# Patient Record
Sex: Female | Born: 1945 | Race: White | Hispanic: No | Marital: Married | Smoking: Current some day smoker
Health system: Southern US, Community
[De-identification: ages and names within clinical notes are randomized; demographics above are authoritative.]

## PROBLEM LIST (undated history)

## (undated) DIAGNOSIS — F0150 Vascular dementia without behavioral disturbance: Secondary | ICD-10-CM

## (undated) DIAGNOSIS — I10 Essential (primary) hypertension: Secondary | ICD-10-CM

## (undated) DIAGNOSIS — F0280 Dementia in other diseases classified elsewhere without behavioral disturbance: Secondary | ICD-10-CM

## (undated) DIAGNOSIS — M1990 Unspecified osteoarthritis, unspecified site: Secondary | ICD-10-CM

## (undated) HISTORY — PX: HIP SURGERY: SHX245

## (undated) HISTORY — PX: TONSILLECTOMY: SUR1361

---

## 2007-09-07 ENCOUNTER — Emergency Department: Payer: Self-pay | Admitting: Emergency Medicine

## 2007-09-07 ENCOUNTER — Other Ambulatory Visit: Payer: Self-pay

## 2009-02-28 ENCOUNTER — Emergency Department: Payer: Self-pay | Admitting: Internal Medicine

## 2009-10-19 ENCOUNTER — Emergency Department: Payer: Self-pay | Admitting: Emergency Medicine

## 2009-10-24 ENCOUNTER — Emergency Department: Payer: Self-pay | Admitting: Internal Medicine

## 2009-11-12 ENCOUNTER — Ambulatory Visit: Payer: Self-pay | Admitting: Unknown Physician Specialty

## 2009-11-18 ENCOUNTER — Ambulatory Visit: Payer: Self-pay | Admitting: Internal Medicine

## 2009-11-19 ENCOUNTER — Ambulatory Visit: Payer: Self-pay | Admitting: Internal Medicine

## 2009-11-23 ENCOUNTER — Ambulatory Visit: Payer: Self-pay | Admitting: Internal Medicine

## 2009-11-23 LAB — CA 125: CA 125: 16 U/mL (ref 0.0–34.0)

## 2009-11-23 LAB — CEA: CEA: 4 ng/mL (ref 0.0–4.7)

## 2009-12-10 ENCOUNTER — Ambulatory Visit: Payer: Self-pay | Admitting: Unknown Physician Specialty

## 2009-12-14 LAB — PATHOLOGY REPORT

## 2009-12-18 ENCOUNTER — Ambulatory Visit: Payer: Self-pay | Admitting: Internal Medicine

## 2010-02-27 ENCOUNTER — Emergency Department: Payer: Self-pay | Admitting: Emergency Medicine

## 2010-03-23 ENCOUNTER — Ambulatory Visit: Payer: Self-pay | Admitting: Unknown Physician Specialty

## 2010-04-13 ENCOUNTER — Ambulatory Visit: Payer: Self-pay | Admitting: Internal Medicine

## 2010-04-15 ENCOUNTER — Ambulatory Visit: Payer: Self-pay | Admitting: Internal Medicine

## 2010-04-20 ENCOUNTER — Ambulatory Visit: Payer: Self-pay | Admitting: Internal Medicine

## 2010-10-04 ENCOUNTER — Emergency Department: Payer: Self-pay | Admitting: Emergency Medicine

## 2010-10-06 ENCOUNTER — Emergency Department: Payer: Self-pay | Admitting: *Deleted

## 2011-01-10 ENCOUNTER — Ambulatory Visit: Payer: Self-pay | Admitting: Gastroenterology

## 2011-06-23 ENCOUNTER — Ambulatory Visit: Payer: Self-pay | Admitting: Unknown Physician Specialty

## 2011-09-01 ENCOUNTER — Emergency Department: Payer: Self-pay | Admitting: Unknown Physician Specialty

## 2011-12-25 ENCOUNTER — Ambulatory Visit: Payer: Self-pay | Admitting: Unknown Physician Specialty

## 2012-03-26 IMAGING — CT CT ABD-PELV W/ CM
1 of 2 series · 15 of 32 positions shown, 19 images · IV contrast (isovue)
Comparison: None

REASON FOR EXAM: (1) LLQ  pain; (2) same
COMMENTS:

PROCEDURE:     CT  - CT ABDOMEN / PELVIS  W  - October 19, 2009 [DATE]
RESULT:     History: Left lower quadrant pain
TECHNIQUE: Multiple axial images of the abdomen and pelvis were performed
from the lung bases to the pubic symphysis, with p.o. contrast and with 100
ml of Isovue 370 intravenous contrast.

[Series 2: abdomen · axial · 0.59mm/px · z∈[+914,+1259]mm · 15 of 75 slices shown, 19 images]
[im 3/75  soft-tissue]
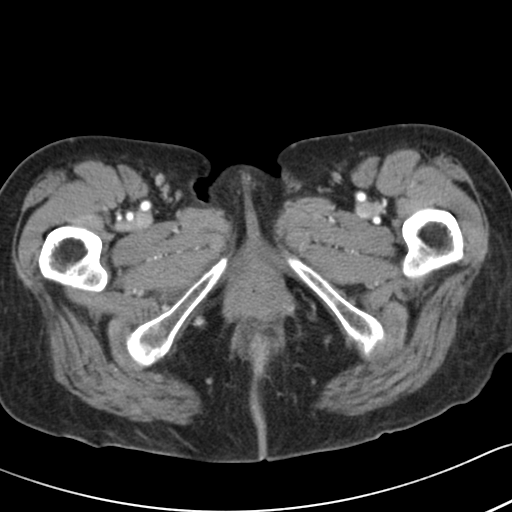
[im 3/75  bone]
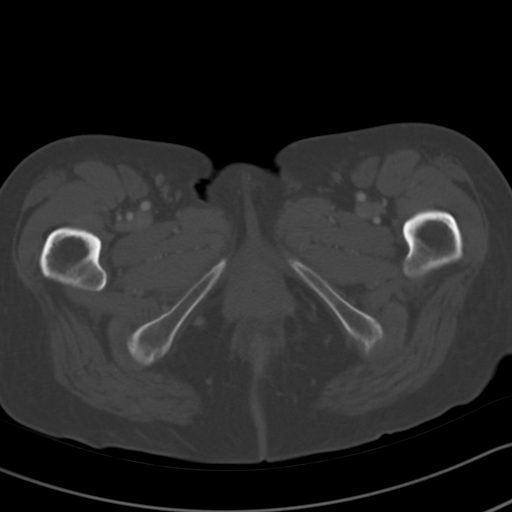
[im 9/75  soft-tissue]
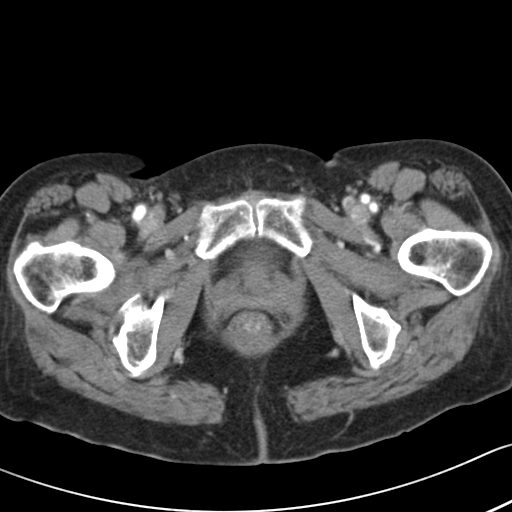
[im 15/75  soft-tissue]
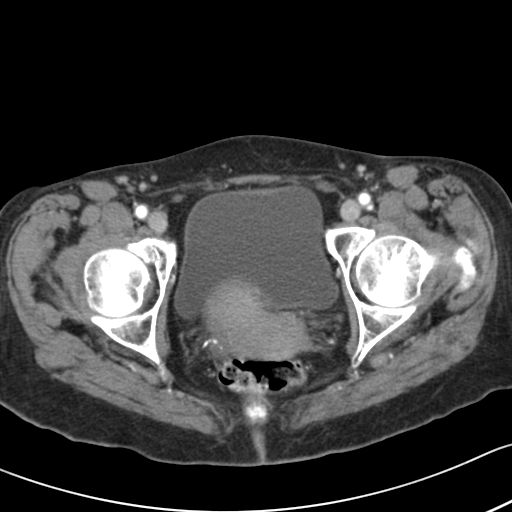
[im 21/75  soft-tissue]
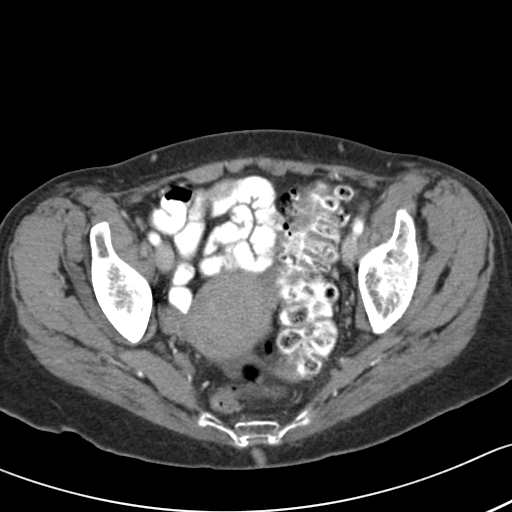
[im 27/75  soft-tissue]
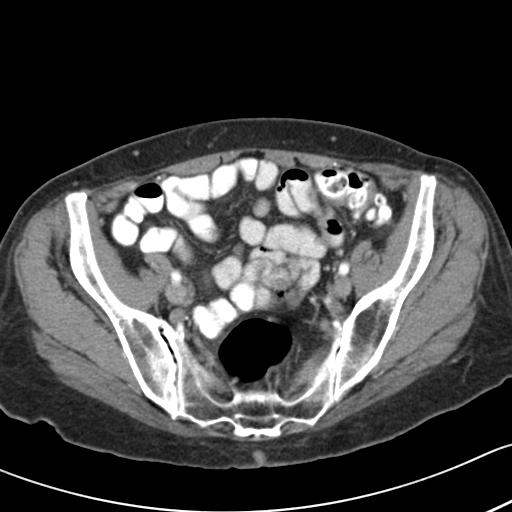
[im 33/75  soft-tissue]
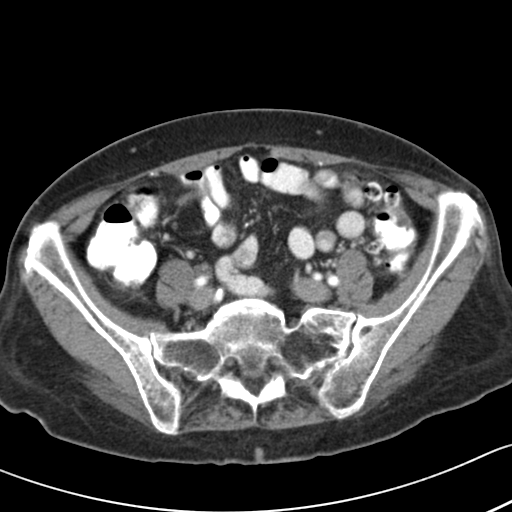
[im 39/75  soft-tissue]
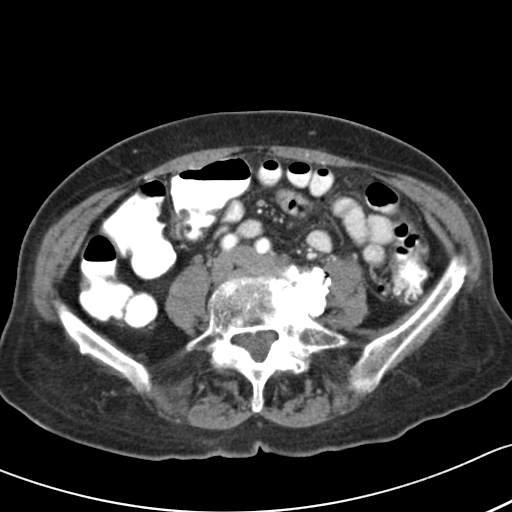
[im 42/75  soft-tissue]
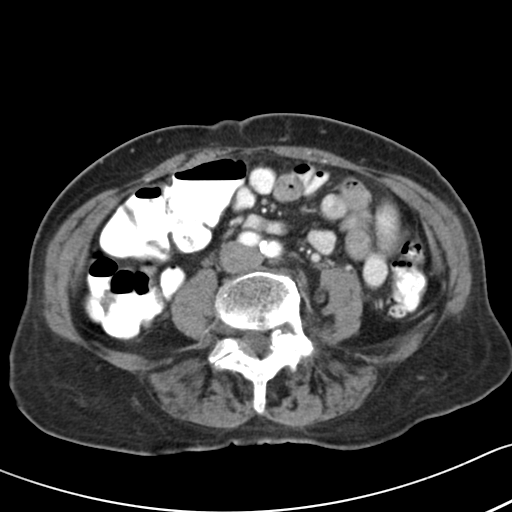
[im 48/75  soft-tissue]
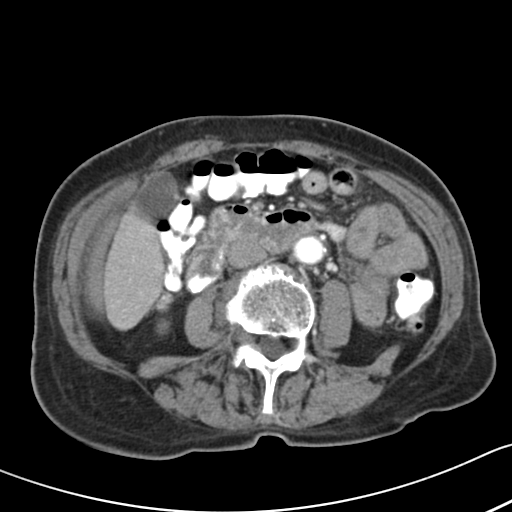
[im 48/75  bone]
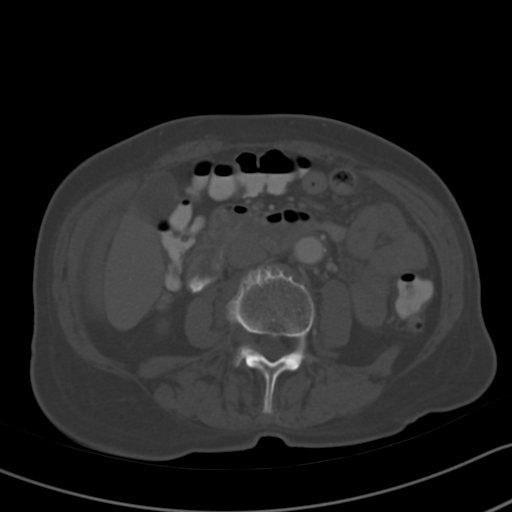
[im 54/75  soft-tissue]
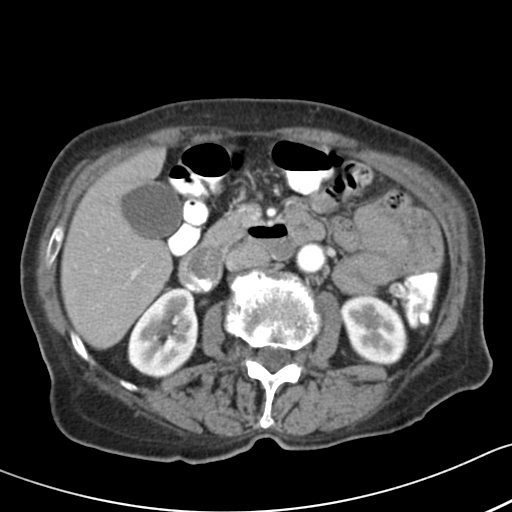
[im 60/75  soft-tissue]
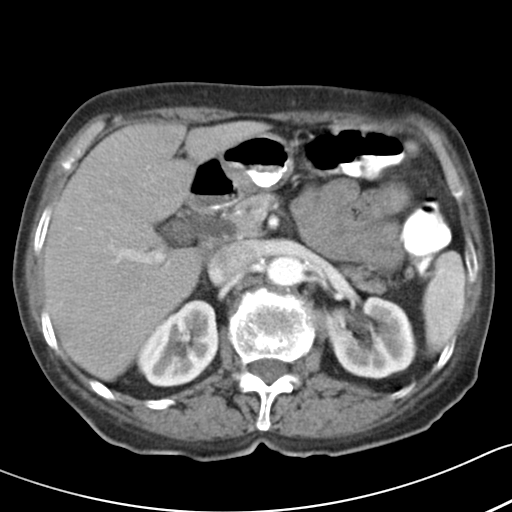
[im 63/75  lung]
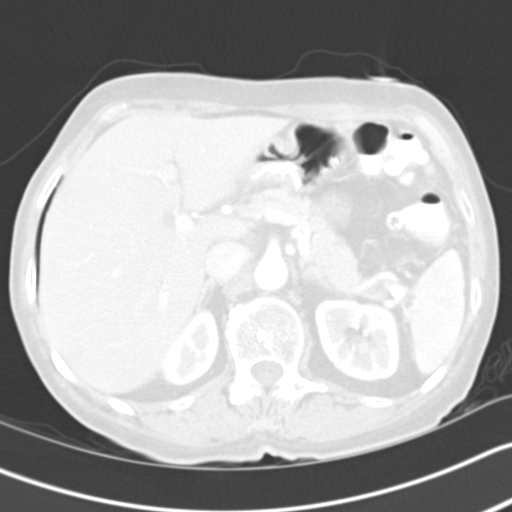
[im 66/75  soft-tissue]
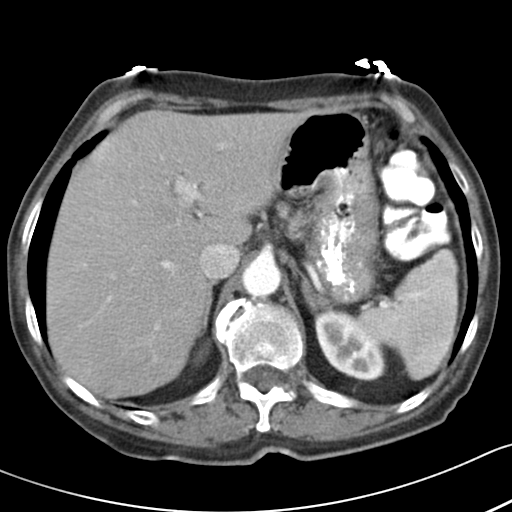
[im 66/75  lung]
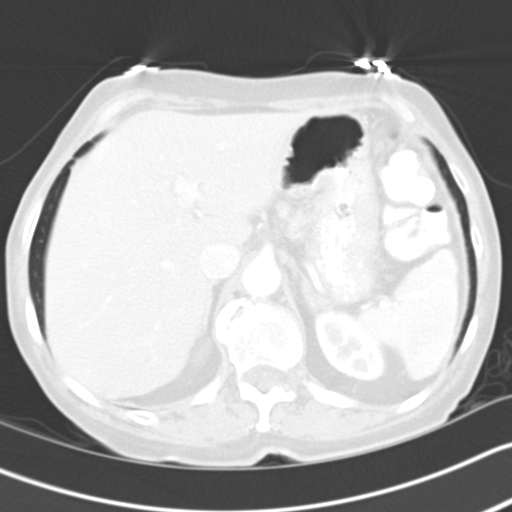
[im 69/75  lung]
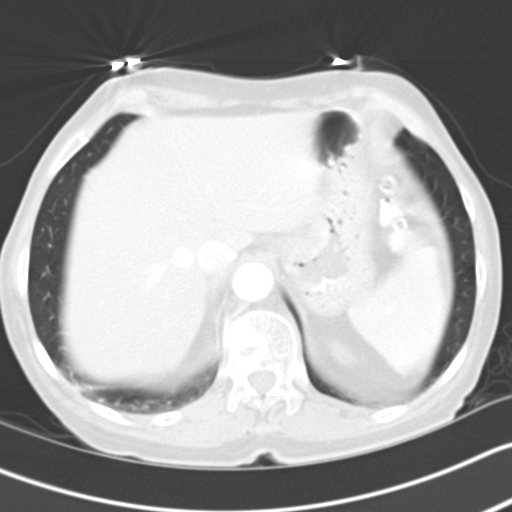
[im 72/75  soft-tissue]
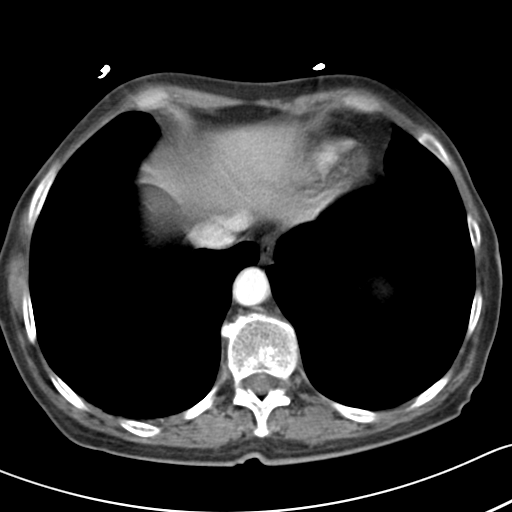
[im 72/75  lung]
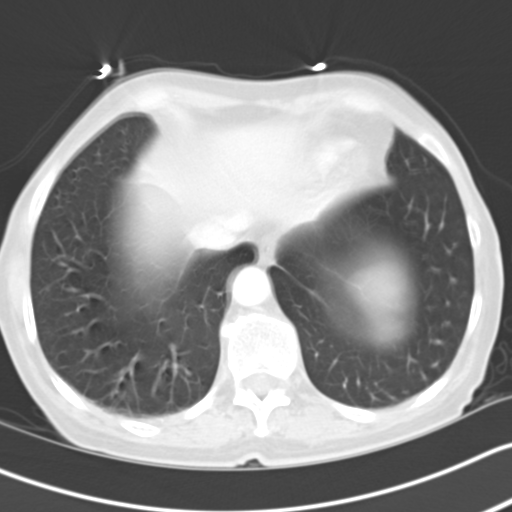

[15 of 32 positions shown; findings below may reference images not displayed]

FINDINGS: The lung bases are clear. There is no pneumothorax. The heart size is
normal.

The liver demonstrates no focal abnormality. There is no intrahepatic or
extrahepatic biliary ductal dilatation. The gallbladder is unremarkable. The
spleen demonstrates no focal abnormality. The kidneys, adrenal glands, and
pancreas are normal. The bladder is unremarkable.

The stomach, duodenum, small intestine, and large intestine demonstrate no
contrast extravasation or dilatation. There is Diverticulosis without
evidence of diverticulitis. There is no pneumoperitoneum, pneumatosis, or
portal venous gas. There is no abdominal or pelvic free fluid. There is no
lymphadenopathy. There is a bulbous appearance of the uterine fundus
measuring 5.2 x 5.8 cm likely representing a uterine fibroid.

The abdominal aorta is normal in caliber with atherosclerosis.

There is transitional anatomy with the left transverse process of L5
articulating with the sacrum with osseous fusion across the articulation.
IMPRESSION: 1. Diverticulosis without evidence of diverticulitis.

2. There is a bulbous appearance of the uterine fundus measuring 5.2 x
cm likely representing a uterine fibroid. Recommend followup pelvic
ultrasound following the patient's acute illness.

## 2012-03-28 ENCOUNTER — Ambulatory Visit: Payer: Self-pay | Admitting: Unknown Physician Specialty

## 2012-03-31 IMAGING — CR DG ABDOMEN 3V
1 series · 4 of 4 positions shown · non-contrast
Comparison: none

REASON FOR EXAM: abd pain
COMMENTS:

PROCEDURE:     DXR - DXR ABDOMEN 3-WAY (INCL PA CXR)  - October 24, 2009  [DATE]
RESULT:     Comparisons:  None

[Series 1: view not recorded · 0.17mm/px · 4 of 4 slices shown]
[im 1/4]
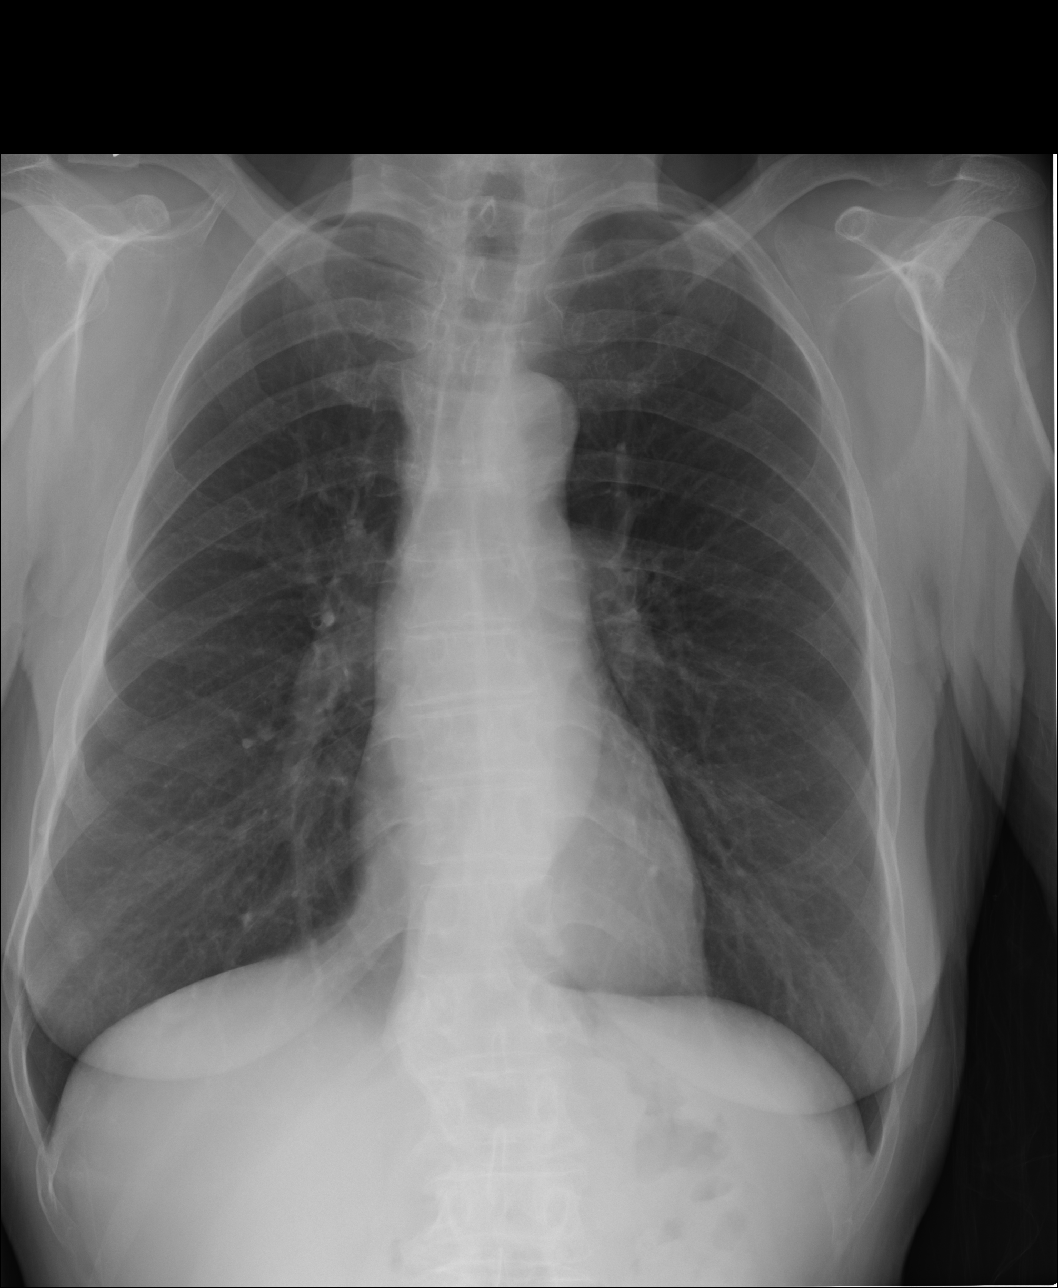
[im 2/4]
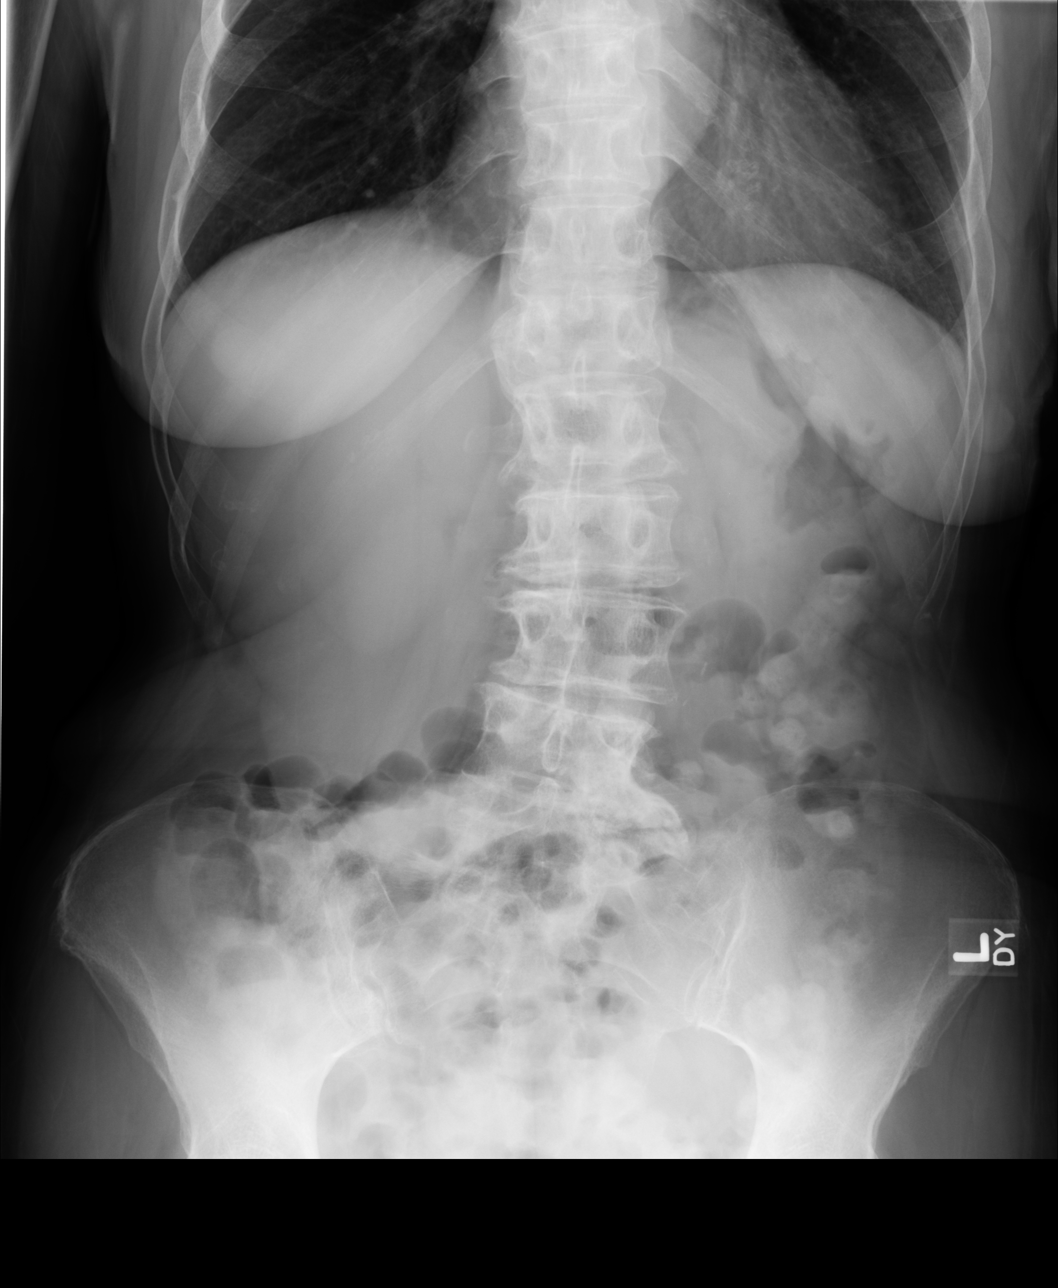
[im 3/4]
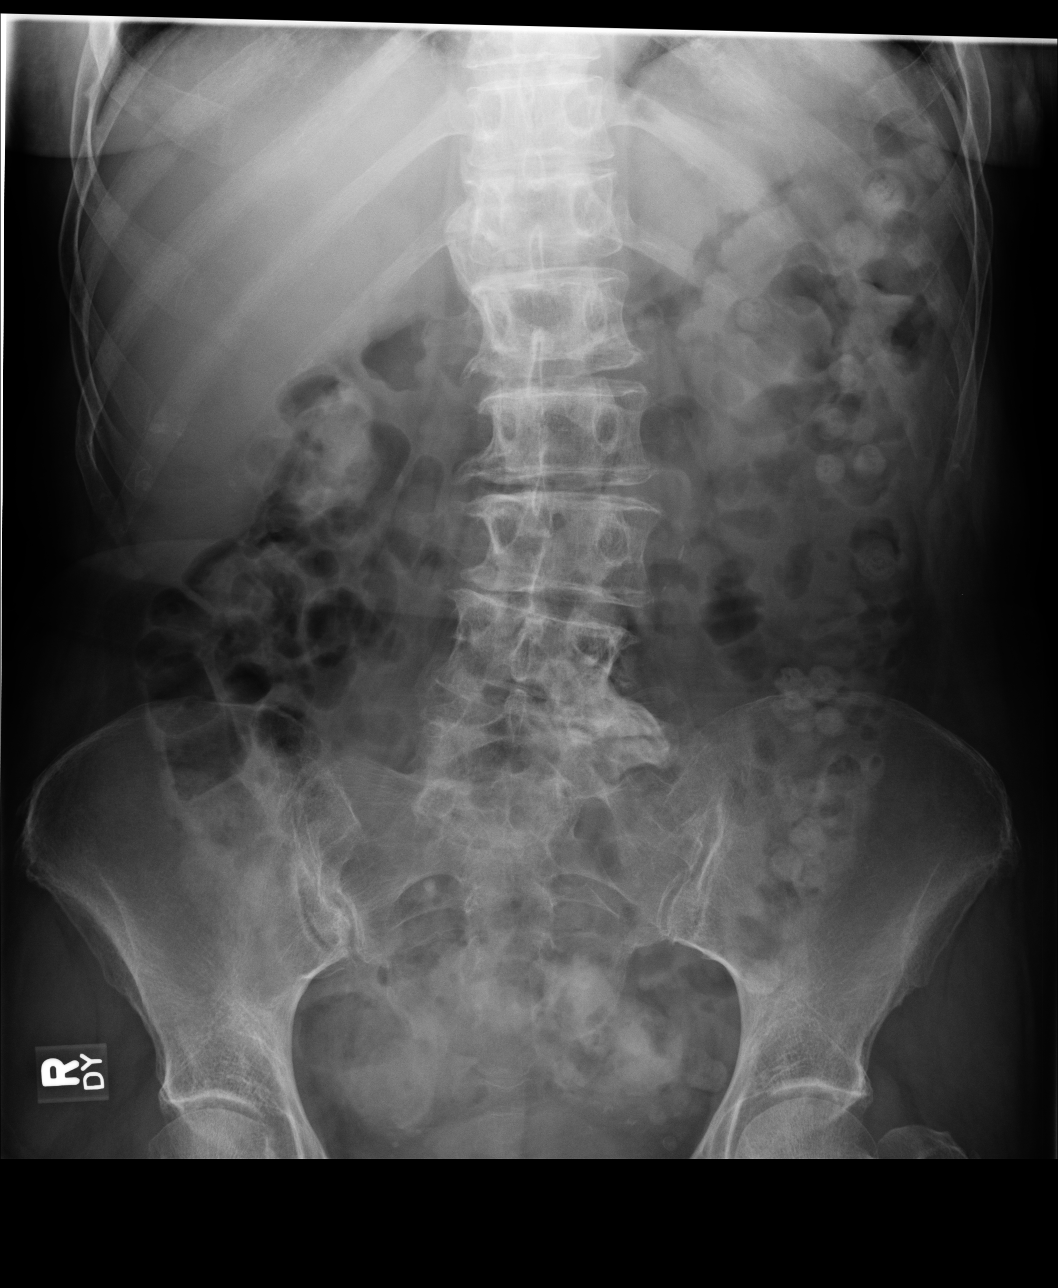
[im 4/4]
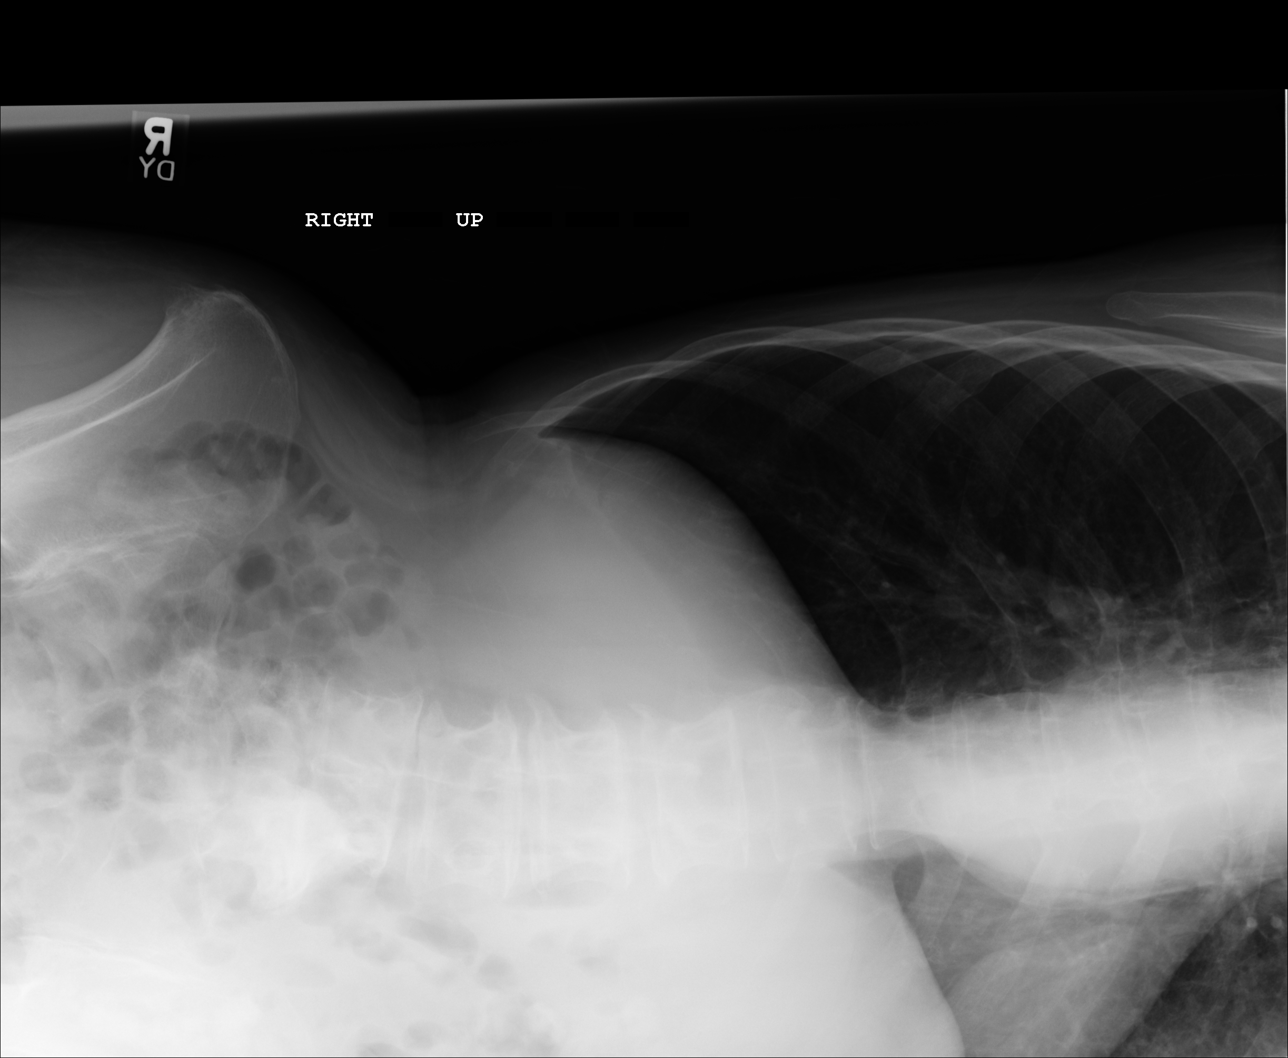

[4 of 4 positions shown; findings below may reference images not displayed]

FINDINGS: PA chest and supine, upright, and left lateral decubitus views of the
abdomen are provided.

The chest is clear. The lungs are hyperinflated likely secondary to COPD.
The heart and mediastinum are unremarkable. The osseous structures are
unremarkable.

There is a nonspecific bowel gas pattern. There is no bowel dilatation to
suggest obstruction. There are no air-fluid levels. There are no dilated
loops, bowel wall thickening, or evidence of mass effect.  Soft tissue
shadows are normal. There is no evidence of pneumoperitoneum, portal venous
gas, or pneumatosis.

There is a levocurvature of the lumbar spine.
IMPRESSION: Unremarkable abdominal series.

## 2013-02-25 ENCOUNTER — Ambulatory Visit: Payer: Self-pay | Admitting: Gastroenterology

## 2013-02-25 LAB — CBC WITH DIFFERENTIAL/PLATELET
Basophil #: 0.1 10*3/uL (ref 0.0–0.1)
Eosinophil %: 0.9 %
HCT: 43.4 % (ref 35.0–47.0)
HGB: 14.6 g/dL (ref 12.0–16.0)
Lymphocyte #: 2.3 10*3/uL (ref 1.0–3.6)
MCH: 30.3 pg (ref 26.0–34.0)
MCV: 90 fL (ref 80–100)
Neutrophil #: 4.8 10*3/uL (ref 1.4–6.5)
Neutrophil %: 63.2 %
RBC: 4.81 10*6/uL (ref 3.80–5.20)
RDW: 12.8 % (ref 11.5–14.5)
WBC: 7.6 10*3/uL (ref 3.6–11.0)

## 2013-02-25 LAB — IRON AND TIBC
Iron Bind.Cap.(Total): 307 ug/dL (ref 250–450)
Unbound Iron-Bind.Cap.: 266 ug/dL

## 2013-02-27 LAB — PATHOLOGY REPORT

## 2013-05-14 ENCOUNTER — Emergency Department: Payer: Self-pay | Admitting: Internal Medicine

## 2013-05-14 LAB — CBC WITH DIFFERENTIAL/PLATELET
BASOS ABS: 0 10*3/uL (ref 0.0–0.1)
Basophil %: 0.4 %
Eosinophil #: 0 10*3/uL (ref 0.0–0.7)
Eosinophil %: 0 %
HCT: 50.2 % — ABNORMAL HIGH (ref 35.0–47.0)
HGB: 17 g/dL — ABNORMAL HIGH (ref 12.0–16.0)
LYMPHS PCT: 6.9 %
Lymphocyte #: 0.7 10*3/uL — ABNORMAL LOW (ref 1.0–3.6)
MCH: 30.6 pg (ref 26.0–34.0)
MCHC: 33.8 g/dL (ref 32.0–36.0)
MCV: 91 fL (ref 80–100)
MONO ABS: 0.4 x10 3/mm (ref 0.2–0.9)
Monocyte %: 4.1 %
Neutrophil #: 9.4 10*3/uL — ABNORMAL HIGH (ref 1.4–6.5)
Neutrophil %: 88.6 %
Platelet: 312 10*3/uL (ref 150–440)
RBC: 5.55 10*6/uL — ABNORMAL HIGH (ref 3.80–5.20)
RDW: 13.3 % (ref 11.5–14.5)
WBC: 10.6 10*3/uL (ref 3.6–11.0)

## 2013-05-14 LAB — URINALYSIS, COMPLETE
BILIRUBIN, UR: NEGATIVE
BLOOD: NEGATIVE
Glucose,UR: NEGATIVE mg/dL (ref 0–75)
Nitrite: NEGATIVE
PH: 5 (ref 4.5–8.0)
Protein: 30
RBC,UR: 4 /HPF (ref 0–5)
Specific Gravity: 1.026 (ref 1.003–1.030)
Squamous Epithelial: 4
WBC UR: 6 /HPF (ref 0–5)

## 2013-05-14 LAB — COMPREHENSIVE METABOLIC PANEL
ALBUMIN: 4.6 g/dL (ref 3.4–5.0)
ALT: 48 U/L (ref 12–78)
ANION GAP: 3 — AB (ref 7–16)
Alkaline Phosphatase: 107 U/L
BILIRUBIN TOTAL: 1.1 mg/dL — AB (ref 0.2–1.0)
BUN: 22 mg/dL — AB (ref 7–18)
CHLORIDE: 105 mmol/L (ref 98–107)
Calcium, Total: 9.5 mg/dL (ref 8.5–10.1)
Co2: 28 mmol/L (ref 21–32)
Creatinine: 1.37 mg/dL — ABNORMAL HIGH (ref 0.60–1.30)
EGFR (African American): 46 — ABNORMAL LOW
GFR CALC NON AF AMER: 40 — AB
GLUCOSE: 103 mg/dL — AB (ref 65–99)
Osmolality: 276 (ref 275–301)
Potassium: 4.4 mmol/L (ref 3.5–5.1)
SGOT(AST): 53 U/L — ABNORMAL HIGH (ref 15–37)
Sodium: 136 mmol/L (ref 136–145)
TOTAL PROTEIN: 8.3 g/dL — AB (ref 6.4–8.2)

## 2013-05-14 LAB — LIPASE, BLOOD: LIPASE: 134 U/L (ref 73–393)

## 2013-06-27 ENCOUNTER — Ambulatory Visit: Payer: Self-pay | Admitting: Internal Medicine

## 2014-03-20 ENCOUNTER — Emergency Department: Payer: Self-pay | Admitting: Emergency Medicine

## 2014-03-20 DIAGNOSIS — M5010 Cervical disc disorder with radiculopathy, unspecified cervical region: Secondary | ICD-10-CM | POA: Diagnosis not present

## 2014-03-20 DIAGNOSIS — Z79891 Long term (current) use of opiate analgesic: Secondary | ICD-10-CM | POA: Diagnosis not present

## 2014-03-20 DIAGNOSIS — M25511 Pain in right shoulder: Secondary | ICD-10-CM | POA: Diagnosis not present

## 2014-03-20 DIAGNOSIS — M5032 Other cervical disc degeneration, mid-cervical region: Secondary | ICD-10-CM | POA: Diagnosis not present

## 2014-03-20 DIAGNOSIS — Z720 Tobacco use: Secondary | ICD-10-CM | POA: Diagnosis not present

## 2014-03-20 DIAGNOSIS — M5412 Radiculopathy, cervical region: Secondary | ICD-10-CM | POA: Diagnosis not present

## 2014-03-20 DIAGNOSIS — S161XXA Strain of muscle, fascia and tendon at neck level, initial encounter: Secondary | ICD-10-CM | POA: Diagnosis not present

## 2014-03-20 DIAGNOSIS — Z880 Allergy status to penicillin: Secondary | ICD-10-CM | POA: Diagnosis not present

## 2014-03-20 DIAGNOSIS — I10 Essential (primary) hypertension: Secondary | ICD-10-CM | POA: Diagnosis not present

## 2014-03-20 DIAGNOSIS — M542 Cervicalgia: Secondary | ICD-10-CM | POA: Diagnosis not present

## 2014-03-26 DIAGNOSIS — M542 Cervicalgia: Secondary | ICD-10-CM | POA: Diagnosis not present

## 2014-03-31 DIAGNOSIS — K219 Gastro-esophageal reflux disease without esophagitis: Secondary | ICD-10-CM | POA: Diagnosis not present

## 2014-04-07 DIAGNOSIS — K219 Gastro-esophageal reflux disease without esophagitis: Secondary | ICD-10-CM | POA: Diagnosis not present

## 2014-04-07 DIAGNOSIS — M170 Bilateral primary osteoarthritis of knee: Secondary | ICD-10-CM | POA: Diagnosis not present

## 2014-04-13 DIAGNOSIS — M47812 Spondylosis without myelopathy or radiculopathy, cervical region: Secondary | ICD-10-CM | POA: Diagnosis not present

## 2014-04-20 DIAGNOSIS — M542 Cervicalgia: Secondary | ICD-10-CM | POA: Diagnosis not present

## 2014-04-20 DIAGNOSIS — J209 Acute bronchitis, unspecified: Secondary | ICD-10-CM | POA: Diagnosis not present

## 2014-05-01 DIAGNOSIS — J449 Chronic obstructive pulmonary disease, unspecified: Secondary | ICD-10-CM | POA: Diagnosis not present

## 2014-05-01 DIAGNOSIS — J40 Bronchitis, not specified as acute or chronic: Secondary | ICD-10-CM | POA: Diagnosis not present

## 2014-05-01 DIAGNOSIS — I10 Essential (primary) hypertension: Secondary | ICD-10-CM | POA: Diagnosis not present

## 2014-07-02 ENCOUNTER — Ambulatory Visit
Admit: 2014-07-02 | Disposition: A | Discharge status: Home / Self Care | Payer: Self-pay | Attending: Internal Medicine | Admitting: Internal Medicine

## 2014-07-02 DIAGNOSIS — Z1231 Encounter for screening mammogram for malignant neoplasm of breast: Secondary | ICD-10-CM | POA: Diagnosis not present

## 2014-07-28 DIAGNOSIS — K219 Gastro-esophageal reflux disease without esophagitis: Secondary | ICD-10-CM | POA: Diagnosis not present

## 2014-07-28 DIAGNOSIS — M170 Bilateral primary osteoarthritis of knee: Secondary | ICD-10-CM | POA: Diagnosis not present

## 2014-07-28 DIAGNOSIS — M542 Cervicalgia: Secondary | ICD-10-CM | POA: Diagnosis not present

## 2014-07-28 DIAGNOSIS — J3089 Other allergic rhinitis: Secondary | ICD-10-CM | POA: Diagnosis not present

## 2014-08-11 DIAGNOSIS — K121 Other forms of stomatitis: Secondary | ICD-10-CM | POA: Diagnosis not present

## 2014-08-11 DIAGNOSIS — J329 Chronic sinusitis, unspecified: Secondary | ICD-10-CM | POA: Diagnosis not present

## 2014-10-17 DIAGNOSIS — L03011 Cellulitis of right finger: Secondary | ICD-10-CM | POA: Diagnosis not present

## 2014-10-22 DIAGNOSIS — G8929 Other chronic pain: Secondary | ICD-10-CM | POA: Diagnosis not present

## 2014-10-22 DIAGNOSIS — M542 Cervicalgia: Secondary | ICD-10-CM | POA: Diagnosis not present

## 2014-10-22 DIAGNOSIS — K219 Gastro-esophageal reflux disease without esophagitis: Secondary | ICD-10-CM | POA: Diagnosis not present

## 2014-10-29 DIAGNOSIS — M542 Cervicalgia: Secondary | ICD-10-CM | POA: Diagnosis not present

## 2014-10-29 DIAGNOSIS — Z124 Encounter for screening for malignant neoplasm of cervix: Secondary | ICD-10-CM | POA: Diagnosis not present

## 2014-10-29 DIAGNOSIS — M170 Bilateral primary osteoarthritis of knee: Secondary | ICD-10-CM | POA: Diagnosis not present

## 2014-10-29 DIAGNOSIS — G8929 Other chronic pain: Secondary | ICD-10-CM | POA: Diagnosis not present

## 2014-10-29 DIAGNOSIS — Z0000 Encounter for general adult medical examination without abnormal findings: Secondary | ICD-10-CM | POA: Diagnosis not present

## 2014-12-24 DIAGNOSIS — H5200 Hypermetropia, unspecified eye: Secondary | ICD-10-CM | POA: Diagnosis not present

## 2014-12-24 DIAGNOSIS — H5210 Myopia, unspecified eye: Secondary | ICD-10-CM | POA: Diagnosis not present

## 2014-12-24 DIAGNOSIS — H25099 Other age-related incipient cataract, unspecified eye: Secondary | ICD-10-CM | POA: Diagnosis not present

## 2014-12-25 DIAGNOSIS — Z23 Encounter for immunization: Secondary | ICD-10-CM | POA: Diagnosis not present

## 2015-01-26 DIAGNOSIS — L03011 Cellulitis of right finger: Secondary | ICD-10-CM | POA: Diagnosis not present

## 2015-01-29 DIAGNOSIS — J069 Acute upper respiratory infection, unspecified: Secondary | ICD-10-CM | POA: Diagnosis not present

## 2015-02-08 DIAGNOSIS — R05 Cough: Secondary | ICD-10-CM | POA: Diagnosis not present

## 2015-02-18 DIAGNOSIS — K219 Gastro-esophageal reflux disease without esophagitis: Secondary | ICD-10-CM | POA: Diagnosis not present

## 2015-02-18 DIAGNOSIS — M170 Bilateral primary osteoarthritis of knee: Secondary | ICD-10-CM | POA: Diagnosis not present

## 2015-02-18 DIAGNOSIS — G8929 Other chronic pain: Secondary | ICD-10-CM | POA: Diagnosis not present

## 2015-02-18 DIAGNOSIS — M8580 Other specified disorders of bone density and structure, unspecified site: Secondary | ICD-10-CM | POA: Diagnosis not present

## 2015-02-18 DIAGNOSIS — M542 Cervicalgia: Secondary | ICD-10-CM | POA: Diagnosis not present

## 2015-02-18 DIAGNOSIS — J209 Acute bronchitis, unspecified: Secondary | ICD-10-CM | POA: Diagnosis not present

## 2015-02-25 DIAGNOSIS — L03011 Cellulitis of right finger: Secondary | ICD-10-CM | POA: Diagnosis not present

## 2015-03-01 DIAGNOSIS — M8580 Other specified disorders of bone density and structure, unspecified site: Secondary | ICD-10-CM | POA: Diagnosis not present

## 2015-05-06 DIAGNOSIS — H9202 Otalgia, left ear: Secondary | ICD-10-CM | POA: Diagnosis not present

## 2015-05-06 DIAGNOSIS — J069 Acute upper respiratory infection, unspecified: Secondary | ICD-10-CM | POA: Diagnosis not present

## 2015-05-25 DIAGNOSIS — R42 Dizziness and giddiness: Secondary | ICD-10-CM | POA: Diagnosis not present

## 2015-05-25 DIAGNOSIS — R0981 Nasal congestion: Secondary | ICD-10-CM | POA: Diagnosis not present

## 2015-06-21 ENCOUNTER — Other Ambulatory Visit: Payer: Self-pay | Admitting: Internal Medicine

## 2015-06-21 DIAGNOSIS — Z1239 Encounter for other screening for malignant neoplasm of breast: Secondary | ICD-10-CM | POA: Diagnosis not present

## 2015-06-21 DIAGNOSIS — M170 Bilateral primary osteoarthritis of knee: Secondary | ICD-10-CM | POA: Diagnosis not present

## 2015-06-21 DIAGNOSIS — K219 Gastro-esophageal reflux disease without esophagitis: Secondary | ICD-10-CM | POA: Diagnosis not present

## 2015-06-21 DIAGNOSIS — J301 Allergic rhinitis due to pollen: Secondary | ICD-10-CM | POA: Diagnosis not present

## 2015-07-05 ENCOUNTER — Other Ambulatory Visit: Payer: Self-pay | Admitting: Internal Medicine

## 2015-07-05 ENCOUNTER — Ambulatory Visit
Admission: RE | Admit: 2015-07-05 | Discharge: 2015-07-05 | Disposition: A | Discharge status: Home / Self Care | Payer: Commercial Managed Care - HMO | Source: Ambulatory Visit | Attending: Internal Medicine | Admitting: Internal Medicine

## 2015-07-05 DIAGNOSIS — R922 Inconclusive mammogram: Secondary | ICD-10-CM | POA: Insufficient documentation

## 2015-07-05 DIAGNOSIS — Z1239 Encounter for other screening for malignant neoplasm of breast: Secondary | ICD-10-CM

## 2015-07-05 DIAGNOSIS — Z1231 Encounter for screening mammogram for malignant neoplasm of breast: Secondary | ICD-10-CM | POA: Diagnosis not present

## 2015-07-07 ENCOUNTER — Other Ambulatory Visit: Payer: Self-pay | Admitting: Internal Medicine

## 2015-07-07 DIAGNOSIS — R928 Other abnormal and inconclusive findings on diagnostic imaging of breast: Secondary | ICD-10-CM

## 2015-07-21 ENCOUNTER — Ambulatory Visit
Admission: RE | Admit: 2015-07-21 | Discharge: 2015-07-21 | Disposition: A | Discharge status: Home / Self Care | Payer: Commercial Managed Care - HMO | Source: Ambulatory Visit | Attending: Internal Medicine | Admitting: Internal Medicine

## 2015-07-21 DIAGNOSIS — R928 Other abnormal and inconclusive findings on diagnostic imaging of breast: Secondary | ICD-10-CM | POA: Diagnosis not present

## 2015-07-23 DIAGNOSIS — J0100 Acute maxillary sinusitis, unspecified: Secondary | ICD-10-CM | POA: Diagnosis not present

## 2015-09-11 ENCOUNTER — Encounter: Payer: Self-pay | Admitting: *Deleted

## 2015-09-11 ENCOUNTER — Emergency Department
Admission: EM | Admit: 2015-09-11 | Discharge: 2015-09-11 | Disposition: A | Discharge status: Home / Self Care | Payer: Commercial Managed Care - HMO | Attending: Emergency Medicine | Admitting: Emergency Medicine

## 2015-09-11 ENCOUNTER — Other Ambulatory Visit: Payer: Self-pay

## 2015-09-11 ENCOUNTER — Emergency Department: Payer: Commercial Managed Care - HMO

## 2015-09-11 DIAGNOSIS — F17200 Nicotine dependence, unspecified, uncomplicated: Secondary | ICD-10-CM | POA: Diagnosis not present

## 2015-09-11 DIAGNOSIS — R059 Cough, unspecified: Secondary | ICD-10-CM

## 2015-09-11 DIAGNOSIS — R0602 Shortness of breath: Secondary | ICD-10-CM | POA: Diagnosis not present

## 2015-09-11 DIAGNOSIS — M170 Bilateral primary osteoarthritis of knee: Secondary | ICD-10-CM | POA: Insufficient documentation

## 2015-09-11 DIAGNOSIS — R05 Cough: Secondary | ICD-10-CM | POA: Insufficient documentation

## 2015-09-11 HISTORY — DX: Unspecified osteoarthritis, unspecified site: M19.90

## 2015-09-11 LAB — BASIC METABOLIC PANEL
Anion gap: 7 (ref 5–15)
BUN: 10 mg/dL (ref 6–20)
CALCIUM: 9.1 mg/dL (ref 8.9–10.3)
CO2: 25 mmol/L (ref 22–32)
CREATININE: 0.8 mg/dL (ref 0.44–1.00)
Chloride: 104 mmol/L (ref 101–111)
GFR calc non Af Amer: >60 mL/min (ref >60)
Glucose, Bld: 107 mg/dL — ABNORMAL HIGH (ref 65–99)
Potassium: 3.8 mmol/L (ref 3.5–5.1)
SODIUM: 136 mmol/L (ref 135–145)

## 2015-09-11 LAB — CBC
HCT: 39.8 % (ref 35.0–47.0)
Hemoglobin: 13.4 g/dL (ref 12.0–16.0)
MCH: 30.1 pg (ref 26.0–34.0)
MCHC: 33.7 g/dL (ref 32.0–36.0)
MCV: 89.1 fL (ref 80.0–100.0)
PLATELETS: 259 10*3/uL (ref 150–440)
RBC: 4.47 MIL/uL (ref 3.80–5.20)
RDW: 13.4 % (ref 11.5–14.5)
WBC: 6.6 10*3/uL (ref 3.6–11.0)

## 2015-09-11 LAB — TROPONIN I: Troponin I: <0.03 ng/mL (ref <0.031)

## 2015-09-11 MED ORDER — ALBUTEROL SULFATE HFA 108 (90 BASE) MCG/ACT IN AERS: 2.0000 | INHALATION_SPRAY | Freq: Four times a day (QID) | RESPIRATORY_TRACT | Status: DC | PRN

## 2015-09-11 NOTE — ED Provider Notes (Signed)
[facility] Emergency Department Provider Note  I have reviewed the triage vital signs and the nursing notes.   HISTORY  Chief Complaint Dizziness   History limited by: Not Limited   HPI Kaylee Robinson is a 70 y.o. female who presented to the emergency department today because of concerns for shortness of breath. This shortness of breath has been getting worse for the past couple of days. It is worse with exertion. She also complains of intermittent hot flash type symptoms where she will become dizzy for a brief period of time. This only lasts a couple of seconds. This is happening once or twice a day. She denies any fevers. No productive cough. She does have a history of smoking however has not been diagnosed with any lung disease.   Past Medical History  Diagnosis Date  . Arthritis     bilateral knees    There are no active problems to display for this patient.   Past Surgical History  Procedure Laterality Date  . Tonsillectomy      adnoidectomy    No current outpatient prescriptions on file.  Allergies Review of patient's allergies indicates no known allergies.  Family History  Problem Relation Age of Onset  . Breast cancer Neg Hx     Social History Social History  Substance Use Topics  . Smoking status: Current Some Day Smoker  . Smokeless tobacco: None  . Alcohol Use: No    Review of Systems  Constitutional: Negative for fever. Cardiovascular: Negative for chest pain. Respiratory: Positive for shortness of breath. Gastrointestinal: Negative for abdominal pain, vomiting and diarrhea. Neurological: Negative for headaches, focal weakness or numbness.  10-point ROS otherwise negative.  ____________________________________________   PHYSICAL EXAM:  VITAL SIGNS: ED Triage Vitals  Enc Vitals Group     BP 09/11/15 1504 133/77 mmHg     Pulse Rate 09/11/15 1504 57     Resp 09/11/15 1504 22     Temp 09/11/15 1504 97.7 F (36.5  C)     Temp Source 09/11/15 1504 Oral     SpO2 09/11/15 1504 100 %     Weight 09/11/15 1504 112 lb (50.803 kg)     Height 09/11/15 1504 5\' 5"  (1.651 m)     Head Cir --    Constitutional: Alert and oriented. Well appearing and in no distress. Eyes: Conjunctivae are normal. PERRL. Normal extraocular movements. ENT   Head: Normocephalic and atraumatic.   Nose: No congestion/rhinnorhea.   Mouth/Throat: Mucous membranes are moist.   Neck: No stridor. Hematological/Lymphatic/Immunilogical: No cervical lymphadenopathy. Cardiovascular: Normal rate, regular rhythm.  No murmurs, rubs, or gallops. Respiratory: Slightly increased rate. Showed some wheezing in the lower lungs bilaterally. No rhonchi or crackles. Gastrointestinal: Soft and nontender. No distention.  Genitourinary: Deferred Musculoskeletal: Normal range of motion in all extremities. No joint effusions.  No lower extremity tenderness nor edema. Neurologic:  Normal speech and language. No gross focal neurologic deficits are appreciated.  Skin:  Skin is warm, dry and intact. No rash noted. Psychiatric: Mood and affect are normal. Speech and behavior are normal. Patient exhibits appropriate insight and judgment.  ____________________________________________    LABS (pertinent positives/negatives)  Labs Reviewed  BASIC METABOLIC PANEL - Abnormal; Notable for the following:    Glucose, Bld 107 (*)    All other components within normal limits  CBC  TROPONIN I  URINALYSIS COMPLETEWITH MICROSCOPIC ([facility] ONLY)  CBG MONITORING, ED     ____________________________________________   EKG  I, Phineas SemenGraydon Reigan Tolliver,  attending physician, personally viewed and interpreted this EKG  EKG Time: 1526 Rate: 56 Rhythm: sinus bradycardia Axis: normal Intervals: qtc 438 QRS: narrow ST changes: no st elevation Impression: abnormal ekg   ____________________________________________     RADIOLOGY  CXR IMPRESSION: Hyperinflation of lungs with flattening of the diaphragm consistent with a air trapping, COPD, or emphysema. No other acute abnormalities.  ____________________________________________   PROCEDURES  Procedure(s) performed: None  Critical Care performed: No  ____________________________________________   INITIAL IMPRESSION / ASSESSMENT AND PLAN / ED COURSE  Pertinent labs & imaging results that were available during my care of the patient were reviewed by me and considered in my medical decision making (see chart for details).  Patient presented to the emergency department today because of concerns for shortness of breath and dizziness. On exam patient is somewhat And I do question some wheezing. Chest x-ray is concerning for signs of COPD she does smoke. Was no signs of current infection. Will plan on discharging with albuterol inhaler. Will also give cardiology follow up for possible "hot flashes"   ____________________________________________   FINAL CLINICAL IMPRESSION(S) / ED DIAGNOSES  Final diagnoses:  Shortness of breath  Cough     Note: This dictation was prepared with Dragon dictation. Any transcriptional errors that result from this process are unintentional    Phineas SemenGraydon Eladia Frame, MD 09/11/15 2035

## 2015-09-11 NOTE — Discharge Instructions (Signed)
Please seek medical attention for any high fevers, chest pain, shortness of breath, change in behavior, persistent vomiting, bloody stool or any other new or concerning symptoms. ° ° °Shortness of Breath °Shortness of breath means you have trouble breathing. Shortness of breath needs medical care right away. °HOME CARE  °· Do not smoke. °· Avoid being around chemicals or things (paint fumes, dust) that may bother your breathing. °· Rest as needed. Slowly begin your normal activities. °· Only take medicines as told by your doctor. °· Keep all doctor visits as told. °GET HELP RIGHT AWAY IF:  °· Your shortness of breath gets worse. °· You feel lightheaded, pass out (faint), or have a cough that is not helped by medicine. °· You cough up blood. °· You have pain with breathing. °· You have pain in your chest, arms, shoulders, or belly (abdomen). °· You have a fever. °· You cannot walk up stairs or exercise the way you normally do. °· You do not get better in the time expected. °· You have a hard time doing normal activities even with rest. °· You have problems with your medicines. °· You have any new symptoms. °MAKE SURE YOU: °· Understand these instructions. °· Will watch your condition. °· Will get help right away if you are not doing well or get worse. °  °This information is not intended to replace advice given to you by your health care provider. Make sure you discuss any questions you have with your health care provider. °  °Document Released: 08/23/2007 Document Revised: 03/11/2013 Document Reviewed: 05/22/2011 °Elsevier Interactive Patient Education ©2016 [employer] ° °

## 2015-09-11 NOTE — ED Notes (Signed)
Patient states 3-4 months ago she had similar symptoms and was diagnosed with sinus infection. Patient c/o intermittent dizziness and shortness of breath. Patient states dizziness resolves after seating down.

## 2015-09-16 DIAGNOSIS — R0602 Shortness of breath: Secondary | ICD-10-CM | POA: Diagnosis not present

## 2015-09-16 DIAGNOSIS — I10 Essential (primary) hypertension: Secondary | ICD-10-CM | POA: Diagnosis not present

## 2015-09-16 DIAGNOSIS — J441 Chronic obstructive pulmonary disease with (acute) exacerbation: Secondary | ICD-10-CM | POA: Diagnosis not present

## 2015-09-29 DIAGNOSIS — R0602 Shortness of breath: Secondary | ICD-10-CM | POA: Diagnosis not present

## 2015-10-06 DIAGNOSIS — I10 Essential (primary) hypertension: Secondary | ICD-10-CM | POA: Diagnosis not present

## 2015-10-06 DIAGNOSIS — R0602 Shortness of breath: Secondary | ICD-10-CM | POA: Diagnosis not present

## 2015-10-28 DIAGNOSIS — J069 Acute upper respiratory infection, unspecified: Secondary | ICD-10-CM | POA: Diagnosis not present

## 2015-10-28 DIAGNOSIS — R05 Cough: Secondary | ICD-10-CM | POA: Diagnosis not present

## 2015-11-18 DIAGNOSIS — K219 Gastro-esophageal reflux disease without esophagitis: Secondary | ICD-10-CM | POA: Diagnosis not present

## 2015-11-18 DIAGNOSIS — I10 Essential (primary) hypertension: Secondary | ICD-10-CM | POA: Diagnosis not present

## 2015-12-16 DIAGNOSIS — M810 Age-related osteoporosis without current pathological fracture: Secondary | ICD-10-CM | POA: Diagnosis not present

## 2015-12-16 DIAGNOSIS — Z23 Encounter for immunization: Secondary | ICD-10-CM | POA: Diagnosis not present

## 2015-12-16 DIAGNOSIS — Z0000 Encounter for general adult medical examination without abnormal findings: Secondary | ICD-10-CM | POA: Diagnosis not present

## 2015-12-16 DIAGNOSIS — M170 Bilateral primary osteoarthritis of knee: Secondary | ICD-10-CM | POA: Diagnosis not present

## 2016-01-12 DIAGNOSIS — R6889 Other general symptoms and signs: Secondary | ICD-10-CM | POA: Diagnosis not present

## 2016-01-12 DIAGNOSIS — J209 Acute bronchitis, unspecified: Secondary | ICD-10-CM | POA: Diagnosis not present

## 2016-01-12 DIAGNOSIS — R509 Fever, unspecified: Secondary | ICD-10-CM | POA: Diagnosis not present

## 2016-01-27 DIAGNOSIS — I10 Essential (primary) hypertension: Secondary | ICD-10-CM | POA: Diagnosis not present

## 2016-01-27 DIAGNOSIS — J441 Chronic obstructive pulmonary disease with (acute) exacerbation: Secondary | ICD-10-CM | POA: Diagnosis not present

## 2016-02-04 DIAGNOSIS — R0602 Shortness of breath: Secondary | ICD-10-CM | POA: Diagnosis not present

## 2016-02-04 DIAGNOSIS — J209 Acute bronchitis, unspecified: Secondary | ICD-10-CM | POA: Diagnosis not present

## 2016-02-04 DIAGNOSIS — J441 Chronic obstructive pulmonary disease with (acute) exacerbation: Secondary | ICD-10-CM | POA: Diagnosis not present

## 2016-02-04 DIAGNOSIS — I10 Essential (primary) hypertension: Secondary | ICD-10-CM | POA: Diagnosis not present

## 2016-02-04 DIAGNOSIS — R05 Cough: Secondary | ICD-10-CM | POA: Diagnosis not present

## 2016-02-06 DIAGNOSIS — J40 Bronchitis, not specified as acute or chronic: Secondary | ICD-10-CM | POA: Diagnosis not present

## 2016-02-06 DIAGNOSIS — K120 Recurrent oral aphthae: Secondary | ICD-10-CM | POA: Diagnosis not present

## 2016-02-08 DIAGNOSIS — K140 Glossitis: Secondary | ICD-10-CM | POA: Diagnosis not present

## 2016-02-08 DIAGNOSIS — K130 Diseases of lips: Secondary | ICD-10-CM | POA: Diagnosis not present

## 2016-03-03 DIAGNOSIS — B9689 Other specified bacterial agents as the cause of diseases classified elsewhere: Secondary | ICD-10-CM | POA: Diagnosis not present

## 2016-03-03 DIAGNOSIS — J329 Chronic sinusitis, unspecified: Secondary | ICD-10-CM | POA: Diagnosis not present

## 2016-03-15 ENCOUNTER — Emergency Department
Admission: EM | Admit: 2016-03-15 | Discharge: 2016-03-15 | Disposition: A | Discharge status: Home / Self Care | Payer: Commercial Managed Care - HMO | Attending: Emergency Medicine | Admitting: Emergency Medicine

## 2016-03-15 ENCOUNTER — Encounter: Payer: Self-pay | Admitting: Emergency Medicine

## 2016-03-15 ENCOUNTER — Emergency Department: Payer: Commercial Managed Care - HMO

## 2016-03-15 DIAGNOSIS — F17200 Nicotine dependence, unspecified, uncomplicated: Secondary | ICD-10-CM | POA: Insufficient documentation

## 2016-03-15 DIAGNOSIS — K1379 Other lesions of oral mucosa: Secondary | ICD-10-CM | POA: Diagnosis not present

## 2016-03-15 DIAGNOSIS — J069 Acute upper respiratory infection, unspecified: Secondary | ICD-10-CM

## 2016-03-15 DIAGNOSIS — R05 Cough: Secondary | ICD-10-CM | POA: Diagnosis not present

## 2016-03-15 DIAGNOSIS — R0602 Shortness of breath: Secondary | ICD-10-CM | POA: Diagnosis not present

## 2016-03-15 MED ORDER — IPRATROPIUM-ALBUTEROL 0.5-2.5 (3) MG/3ML IN SOLN
3.0000 mL | Freq: Once | RESPIRATORY_TRACT | Status: AC
  Administered 2016-03-15: 3 mL via RESPIRATORY_TRACT
  Filled 2016-03-15: qty 3

## 2016-03-15 MED ORDER — BENZONATATE 100 MG PO CAPS: 100.0000 mg | ORAL_CAPSULE | Freq: Three times a day (TID) | ORAL | 0 refills | Status: DC | PRN

## 2016-03-15 NOTE — ED Provider Notes (Signed)
[facility] Emergency Department Provider Note ____________________________________________  Time seen: 1210  I have reviewed the triage vital signs and the nursing notes.  HISTORY  Chief Complaint  URI  HPI Kaylee Robinson is a 70 y.o. female percent to the ED for evaluation of some sinus symptoms, shortness of breath, and a dry cough. Patient describes that she's had cold symptoms for the last several days. She was seen by primary care provider about 12 days prior, and placed on Ceftin prescription which she has completed. She describes as clear the medication however she has had increased cold symptoms, mild cough and some shortness of breath. She was a report some dryness to her mouth as well denies any outright fevers, chills, sweats. She reports receiving a flu vaccine and pneumonia vaccine, this year.   Past Medical History:  Diagnosis Date  . Arthritis    bilateral knees    There are no active problems to display for this patient.   Past Surgical History:  Procedure Laterality Date  . TONSILLECTOMY     adnoidectomy    Prior to Admission medications   Medication Sig Start Date End Date Taking? Authorizing Provider  albuterol (PROVENTIL HFA;VENTOLIN HFA) 108 (90 Base) MCG/ACT inhaler Inhale 2 puffs into the lungs every 6 (six) hours as needed for wheezing or shortness of breath. 09/11/15   Phineas SemenGraydon Goodman, MD  benzonatate (TESSALON PERLES) 100 MG capsule Take 1 capsule (100 mg total) by mouth 3 (three) times daily as needed for cough (Take 1-2 per dose). 03/15/16   Charlesetta IvoryJenise V Bacon Fotios Amos, PA-C   Allergies Patient has no known allergies.  Family History  Problem Relation Age of Onset  . Breast cancer Neg Hx     Social History Social History  Substance Use Topics  . Smoking status: Current Some Day Smoker  . Smokeless tobacco: Never Used  . Alcohol use No    Review of Systems  Constitutional: Negative for fever. Eyes: Negative for visual  changes. ENT: Negative for sore throat. Cardiovascular: Negative for chest pain. Respiratory: Positive for shortness of breath. Reports dry, nonproductive cough. Gastrointestinal: Negative for abdominal pain, vomiting and diarrhea. Musculoskeletal: Negative for back pain. Reports body aches. Skin: Negative for rash. Neurological: Negative for headaches, focal weakness or numbness. ____________________________________________  PHYSICAL EXAM:  VITAL SIGNS: ED Triage Vitals  Enc Vitals Group     BP 03/15/16 1149 137/69     Pulse Rate 03/15/16 1149 63     Resp 03/15/16 1149 20     Temp 03/15/16 1149 98.2 F (36.8 C)     Temp Source 03/15/16 1149 Oral     SpO2 03/15/16 1149 98 %     Weight 03/15/16 1148 118 lb (53.5 kg)     Height 03/15/16 1148 5\' 5"  (1.651 m)     Head Circumference --      Peak Flow --      Pain Score 03/15/16 1148 3     Pain Loc --      Pain Edu? --      Excl. in GC? --    Constitutional: Alert and oriented. Well appearing and in no distress. Head: Normocephalic and atraumatic. Eyes: Conjunctivae are normal. PERRL. Normal extraocular movements Ears: Canals clear. TMs intact bilaterally. Nose: No congestion/rhinorrhea/epistaxis. Mouth/Throat: Mucous membranes are moist. Edentulous. Upper denture in place. No oral lesions or blisters are appreciated. Neck: Supple. No thyromegaly. Hematological/Lymphatic/Immunological: No cervical lymphadenopathy. Cardiovascular: Normal rate, regular rhythm. Normal distal pulses. Respiratory: Normal respiratory effort.  No wheezes/rales/rhonchi. Musculoskeletal: Nontender with normal range of motion in all extremities.  Skin:  Skin is warm, dry and intact. No rash noted. ____________________________________________   RADIOLOGY  CXR IMPRESSION: No evidence of active disease. ____________________________________________  PROCEDURES  DuoNeb x 1 ____________________________________________  INITIAL IMPRESSION /  ASSESSMENT AND PLAN / ED COURSE  A improvement of shortness of breath following DuoNeb treatment. She is reassured by her negative chest x-ray. She will be [facility]. She is encouraged to follow with her primary care provider for ongoing symptom management.  Clinical Course    ____________________________________________  FINAL CLINICAL IMPRESSION(S) / ED DIAGNOSES  Final diagnoses:  Mouth pain  Viral upper respiratory tract infection      Lissa HoardJenise V Bacon Domonick Sittner, PA-C 03/15/16 1927    Jene Everyobert Kinner, MD 03/17/16 [id number]

## 2016-03-15 NOTE — ED Triage Notes (Signed)
States she thinks she may have some sinus issues states she can't breath right  resp even and non labored   Dry cough no fever

## 2016-03-15 NOTE — Discharge Instructions (Signed)
Your exam and chest x-ray are normal today. Take the cough medicine as needed. Use warm-salty water to gargle as needed for mouth pain. Avoid use of your dentures until symptoms improve. Follow-up with your provider for continued or worsening symptoms.

## 2016-05-24 DIAGNOSIS — F17200 Nicotine dependence, unspecified, uncomplicated: Secondary | ICD-10-CM | POA: Diagnosis not present

## 2016-05-24 DIAGNOSIS — Z131 Encounter for screening for diabetes mellitus: Secondary | ICD-10-CM | POA: Diagnosis not present

## 2016-05-24 DIAGNOSIS — B9689 Other specified bacterial agents as the cause of diseases classified elsewhere: Secondary | ICD-10-CM | POA: Diagnosis not present

## 2016-05-24 DIAGNOSIS — I10 Essential (primary) hypertension: Secondary | ICD-10-CM | POA: Diagnosis not present

## 2016-05-24 DIAGNOSIS — J208 Acute bronchitis due to other specified organisms: Secondary | ICD-10-CM | POA: Diagnosis not present

## 2016-05-24 DIAGNOSIS — Z0000 Encounter for general adult medical examination without abnormal findings: Secondary | ICD-10-CM | POA: Diagnosis not present

## 2016-06-13 DIAGNOSIS — S46911A Strain of unspecified muscle, fascia and tendon at shoulder and upper arm level, right arm, initial encounter: Secondary | ICD-10-CM | POA: Diagnosis not present

## 2016-07-03 DIAGNOSIS — Z91038 Other insect allergy status: Secondary | ICD-10-CM | POA: Diagnosis not present

## 2016-07-22 DIAGNOSIS — M7552 Bursitis of left shoulder: Secondary | ICD-10-CM | POA: Diagnosis not present

## 2016-07-22 DIAGNOSIS — M25511 Pain in right shoulder: Secondary | ICD-10-CM | POA: Diagnosis not present

## 2016-08-22 DIAGNOSIS — Z131 Encounter for screening for diabetes mellitus: Secondary | ICD-10-CM | POA: Diagnosis not present

## 2016-08-22 DIAGNOSIS — I10 Essential (primary) hypertension: Secondary | ICD-10-CM | POA: Diagnosis not present

## 2016-08-29 DIAGNOSIS — M170 Bilateral primary osteoarthritis of knee: Secondary | ICD-10-CM | POA: Diagnosis not present

## 2016-08-29 DIAGNOSIS — K219 Gastro-esophageal reflux disease without esophagitis: Secondary | ICD-10-CM | POA: Diagnosis not present

## 2016-08-29 DIAGNOSIS — R634 Abnormal weight loss: Secondary | ICD-10-CM | POA: Diagnosis not present

## 2016-08-29 DIAGNOSIS — M810 Age-related osteoporosis without current pathological fracture: Secondary | ICD-10-CM | POA: Diagnosis not present

## 2016-09-06 DIAGNOSIS — J441 Chronic obstructive pulmonary disease with (acute) exacerbation: Secondary | ICD-10-CM | POA: Diagnosis not present

## 2016-11-19 DIAGNOSIS — J988 Other specified respiratory disorders: Secondary | ICD-10-CM | POA: Diagnosis not present

## 2016-11-19 DIAGNOSIS — R0602 Shortness of breath: Secondary | ICD-10-CM | POA: Diagnosis not present

## 2016-12-19 DIAGNOSIS — Z23 Encounter for immunization: Secondary | ICD-10-CM | POA: Diagnosis not present

## 2016-12-29 DIAGNOSIS — R42 Dizziness and giddiness: Secondary | ICD-10-CM | POA: Diagnosis not present

## 2016-12-29 DIAGNOSIS — R05 Cough: Secondary | ICD-10-CM | POA: Diagnosis not present

## 2016-12-29 DIAGNOSIS — R0602 Shortness of breath: Secondary | ICD-10-CM | POA: Diagnosis not present

## 2016-12-29 DIAGNOSIS — F17200 Nicotine dependence, unspecified, uncomplicated: Secondary | ICD-10-CM | POA: Diagnosis not present

## 2016-12-29 DIAGNOSIS — J441 Chronic obstructive pulmonary disease with (acute) exacerbation: Secondary | ICD-10-CM | POA: Diagnosis not present

## 2017-08-06 ENCOUNTER — Other Ambulatory Visit: Payer: Self-pay | Admitting: Neurology

## 2017-08-06 DIAGNOSIS — R4189 Other symptoms and signs involving cognitive functions and awareness: Secondary | ICD-10-CM

## 2017-08-17 ENCOUNTER — Ambulatory Visit: Payer: Medicare Other

## 2017-08-23 ENCOUNTER — Ambulatory Visit
Admission: RE | Admit: 2017-08-23 | Discharge: 2017-08-23 | Disposition: A | Discharge status: Home / Self Care | Payer: Medicare Other | Source: Ambulatory Visit | Attending: Neurology | Admitting: Neurology

## 2017-08-23 DIAGNOSIS — R4189 Other symptoms and signs involving cognitive functions and awareness: Secondary | ICD-10-CM | POA: Diagnosis not present

## 2017-08-23 DIAGNOSIS — G319 Degenerative disease of nervous system, unspecified: Secondary | ICD-10-CM | POA: Diagnosis not present

## 2017-08-23 DIAGNOSIS — I739 Peripheral vascular disease, unspecified: Secondary | ICD-10-CM | POA: Diagnosis not present

## 2017-08-29 ENCOUNTER — Emergency Department
Admission: EM | Admit: 2017-08-29 | Discharge: 2017-08-29 | Disposition: A | Discharge status: Home / Self Care | Payer: Medicare Other | Attending: Emergency Medicine | Admitting: Emergency Medicine

## 2017-08-29 ENCOUNTER — Encounter: Payer: Self-pay | Admitting: Emergency Medicine

## 2017-08-29 ENCOUNTER — Emergency Department: Payer: Medicare Other

## 2017-08-29 DIAGNOSIS — J40 Bronchitis, not specified as acute or chronic: Secondary | ICD-10-CM | POA: Insufficient documentation

## 2017-08-29 DIAGNOSIS — Z79899 Other long term (current) drug therapy: Secondary | ICD-10-CM | POA: Insufficient documentation

## 2017-08-29 DIAGNOSIS — R0602 Shortness of breath: Secondary | ICD-10-CM | POA: Diagnosis present

## 2017-08-29 DIAGNOSIS — F17210 Nicotine dependence, cigarettes, uncomplicated: Secondary | ICD-10-CM | POA: Diagnosis not present

## 2017-08-29 DIAGNOSIS — J449 Chronic obstructive pulmonary disease, unspecified: Secondary | ICD-10-CM | POA: Diagnosis not present

## 2017-08-29 LAB — COMPREHENSIVE METABOLIC PANEL
ALT: 19 U/L (ref 14–54)
AST: 29 U/L (ref 15–41)
Albumin: 4.3 g/dL (ref 3.5–5.0)
Alkaline Phosphatase: 91 U/L (ref 38–126)
Anion gap: 10 (ref 5–15)
BUN: 8 mg/dL (ref 6–20)
CHLORIDE: 105 mmol/L (ref 101–111)
CO2: 25 mmol/L (ref 22–32)
CREATININE: 0.74 mg/dL (ref 0.44–1.00)
Calcium: 9.3 mg/dL (ref 8.9–10.3)
GFR calc non Af Amer: >60 mL/min (ref >60)
Glucose, Bld: 97 mg/dL (ref 65–99)
Potassium: 4 mmol/L (ref 3.5–5.1)
SODIUM: 140 mmol/L (ref 135–145)
Total Bilirubin: 1.1 mg/dL (ref 0.3–1.2)
Total Protein: 6.9 g/dL (ref 6.5–8.1)

## 2017-08-29 LAB — CBC WITH DIFFERENTIAL/PLATELET
BASOS ABS: 0.1 10*3/uL (ref 0–0.1)
Basophils Relative: 1 %
EOS ABS: 0.1 10*3/uL (ref 0–0.7)
EOS PCT: 2 %
HCT: 46.1 % (ref 35.0–47.0)
Hemoglobin: 15.7 g/dL (ref 12.0–16.0)
Lymphocytes Relative: 27 %
Lymphs Abs: 1.7 10*3/uL (ref 1.0–3.6)
MCH: 30.3 pg (ref 26.0–34.0)
MCHC: 34 g/dL (ref 32.0–36.0)
MCV: 89.1 fL (ref 80.0–100.0)
Monocytes Absolute: 0.4 10*3/uL (ref 0.2–0.9)
Monocytes Relative: 6 %
Neutro Abs: 4.3 10*3/uL (ref 1.4–6.5)
Neutrophils Relative %: 64 %
PLATELETS: 321 10*3/uL (ref 150–440)
RBC: 5.18 MIL/uL (ref 3.80–5.20)
RDW: 13.3 % (ref 11.5–14.5)
WBC: 6.6 10*3/uL (ref 3.6–11.0)

## 2017-08-29 LAB — TROPONIN I: Troponin I: <0.03 ng/mL (ref <0.03)

## 2017-08-29 MED ORDER — PREDNISONE 20 MG PO TABS: 60.0000 mg | ORAL_TABLET | Freq: Every day | ORAL | 0 refills | Status: AC

## 2017-08-29 MED ORDER — PREDNISONE 20 MG PO TABS
60.0000 mg | ORAL_TABLET | Freq: Once | ORAL | Status: AC
  Administered 2017-08-29: 60 mg via ORAL
  Filled 2017-08-29: qty 6

## 2017-08-29 MED ORDER — IPRATROPIUM-ALBUTEROL 0.5-2.5 (3) MG/3ML IN SOLN
3.0000 mL | Freq: Once | RESPIRATORY_TRACT | Status: AC
  Administered 2017-08-29: 3 mL via RESPIRATORY_TRACT
  Filled 2017-08-29: qty 3

## 2017-08-29 MED ORDER — DOXYCYCLINE HYCLATE 100 MG PO TABS
100.0000 mg | ORAL_TABLET | Freq: Once | ORAL | Status: AC
  Administered 2017-08-29: 100 mg via ORAL
  Filled 2017-08-29: qty 1

## 2017-08-29 MED ORDER — DOXYCYCLINE HYCLATE 100 MG PO CAPS: 100.0000 mg | ORAL_CAPSULE | Freq: Two times a day (BID) | ORAL | 0 refills | Status: AC

## 2017-08-29 NOTE — ED Provider Notes (Addendum)
[facility] Emergency Department Provider Note  ____________________________________________  Time seen: Approximately 11:39 AM  I have reviewed the triage vital signs and the nursing notes.   HISTORY  Chief Complaint Shortness of Breath and Headache   HPI Kaylee Robinson is a 72 y.o. female the history of COPD and smoking who presents for evaluation of shortness of breath and cough.  Patient reports a few days of progressively worsening shortness of breath, dry cough, fatigue, and generalized weakness.  No fever or chills, no chest pain, no nausea, no vomiting, no diarrhea, no abdominal pain, no dysuria hematuria.  Patient has been using her inhalers which help at home.  She denies shortness of breath at rest however has mild to moderate shortness of breath with minimal exertion.  No personal or family history of blood clots, recent travel immobilization, leg pain or swelling, hemoptysis, exogenous hormones or history of cancer.   Past Medical History:  Diagnosis Date  . Arthritis    bilateral knees    There are no active problems to display for this patient.   Past Surgical History:  Procedure Laterality Date  . TONSILLECTOMY     adnoidectomy    Prior to Admission medications   Medication Sig Start Date End Date Taking? Authorizing Provider  albuterol (PROVENTIL HFA;VENTOLIN HFA) 108 (90 Base) MCG/ACT inhaler Inhale 2 puffs into the lungs every 6 (six) hours as needed for wheezing or shortness of breath. 09/11/15   Phineas Semen, MD  benzonatate (TESSALON PERLES) 100 MG capsule Take 1 capsule (100 mg total) by mouth 3 (three) times daily as needed for cough (Take 1-2 per dose). 03/15/16   Menshew, Charlesetta Ivory, PA-C  doxycycline (VIBRAMYCIN) 100 MG capsule Take 1 capsule (100 mg total) by mouth 2 (two) times daily for 7 days. 08/29/17 09/05/17  Nita Sickle, MD  predniSONE (DELTASONE) 20 MG tablet Take 3 tablets (60 mg total) by mouth daily  for 4 days. 08/29/17 09/02/17  Nita Sickle, MD    Allergies Patient has no known allergies.  Family History  Problem Relation Age of Onset  . Breast cancer Neg Hx     Social History Social History   Tobacco Use  . Smoking status: Current Some Day Smoker  . Smokeless tobacco: Never Used  Substance Use Topics  . Alcohol use: No  . Drug use: Not on file    Review of Systems  Constitutional: Negative for fever. + fatigue Eyes: Negative for visual changes. ENT: Negative for sore throat. Neck: No neck pain  Cardiovascular: Negative for chest pain. Respiratory: + shortness of breath, cough Gastrointestinal: Negative for abdominal pain, vomiting or diarrhea. Genitourinary: Negative for dysuria. Musculoskeletal: Negative for back pain. Skin: Negative for rash. Neurological: Negative for headaches, weakness or numbness. Psych: No SI or HI  ____________________________________________   PHYSICAL EXAM:  VITAL SIGNS: ED Triage Vitals  Enc Vitals Group     BP 08/29/17 0908 (!) 145/79     Pulse Rate 08/29/17 0908 69     Resp 08/29/17 0908 20     Temp 08/29/17 0908 98.1 F (36.7 C)     Temp Source 08/29/17 0908 Oral     SpO2 08/29/17 0908 100 %     Weight 08/29/17 0912 120 lb (54.4 kg)     Height 08/29/17 0912 5\' 5"  (1.651 m)     Head Circumference --      Peak Flow --      Pain Score 08/29/17 0911 4  Pain Loc --      Pain Edu? --      Excl. in GC? --     Constitutional: Alert and oriented. Well appearing and in no apparent distress. HEENT:      Head: Normocephalic and atraumatic.         Eyes: Conjunctivae are normal. Sclera is non-icteric.       Mouth/Throat: Mucous membranes are moist.       Neck: Supple with no signs of meningismus. Cardiovascular: Regular rate and rhythm. No murmurs, gallops, or rubs. 2+ symmetrical distal pulses are present in all extremities. No JVD. Respiratory: Normal respiratory effort. Lungs are clear to auscultation bilaterally  with decreased air movement. No wheezes, crackles, or rhonchi.  Gastrointestinal: Soft, non tender, and non distended with positive bowel sounds. No rebound or guarding. Musculoskeletal: Nontender with normal range of motion in all extremities. No edema, cyanosis, or erythema of extremities. Neurologic: Normal speech and language. Face is symmetric. Moving all extremities. No gross focal neurologic deficits are appreciated. Skin: Skin is warm, dry and intact. No rash noted. Psychiatric: Mood and affect are normal. Speech and behavior are normal.  ____________________________________________   LABS (all labs ordered are listed, but only abnormal results are displayed)  Labs Reviewed  CBC WITH DIFFERENTIAL/PLATELET  COMPREHENSIVE METABOLIC PANEL  TROPONIN I   ____________________________________________  EKG  ED ECG REPORT I, Nita Sicklearolina Skila Rollins, the attending physician, personally viewed and interpreted this ECG.  Normal sinus rhythm, rate of 70, short PR interval with no delta wave, normal QRS and QTC, right axis deviation, T wave inversions in I, aVL, V2-V4, no ST elevations or depressions.  Unchanged from prior from 2017 ____________________________________________  RADIOLOGY  I have personally reviewed the images performed during this visit and I agree with the Radiologist's read.   Interpretation by Radiologist:  Dg Chest 2 View  Result Date: 08/29/2017 CLINICAL DATA:  Shortness of breath. EXAM: CHEST - 2 VIEW COMPARISON:  Chest x-ray dated March 15, 2016. FINDINGS: The heart size and mediastinal contours are within normal limits. Normal pulmonary vascularity. The lungs remain hyperinflated. Bilateral nipple shadows again noted. No focal consolidation, pleural effusion, or pneumothorax. No acute osseous abnormality. IMPRESSION: Mild hyperinflation, suspect COPD. No active cardiopulmonary disease. Electronically Signed   By: Obie DredgeWilliam T Derry M.D.   On: 08/29/2017 09:31     ____________________________________________   PROCEDURES  Procedure(s) performed: None Procedures Critical Care performed:  None ____________________________________________   INITIAL IMPRESSION / ASSESSMENT AND PLAN / ED COURSE  72 y.o. female the history of COPD and smoking who presents for evaluation of shortness of breath and cough.  Patient is well-appearing, no distress, normal vital signs, normal work of breathing with slightly diminished air movement bilaterally.  Chest x-ray with no evidence of pneumonia.  Labs show normal white count.  Vitals with no fever.  Presentation concerning for bronchitis.  Will treat with duo nebs, prednisone and doxycycline.  No clinical signs or symptoms of pulmonary embolism, dissection, or ACS.  Plan to discharge home on prednisone and doxycycline to follow-up with primary care doctor.  Return precautions were discussed with patient.      As part of my medical decision making, I reviewed the following data within the electronic MEDICAL RECORD NUMBER Nursing notes reviewed and incorporated, Labs reviewed , EKG interpreted , Old EKG reviewed, Old chart reviewed, Radiograph reviewed , Notes from prior ED visits and [place] Controlled Substance Database    Pertinent labs & imaging results that were available during my  care of the patient were reviewed by me and considered in my medical decision making (see chart for details).    ____________________________________________   FINAL CLINICAL IMPRESSION(S) / ED DIAGNOSES  Final diagnoses:  Bronchitis      NEW MEDICATIONS STARTED DURING THIS VISIT:  ED Discharge Orders        Ordered    predniSONE (DELTASONE) 20 MG tablet  Daily     08/29/17 1139    doxycycline (VIBRAMYCIN) 100 MG capsule  2 times daily     08/29/17 1139       Note:  This document was prepared using Dragon voice recognition software and may include unintentional dictation errors.    Don Perking, [place], MD 08/29/17  1145    Don Perking [place], MD 08/29/17 1145

## 2017-08-29 NOTE — ED Notes (Signed)
Pt ambulatory upon discharge but accepted a ride to the front in a wheel chair from [place], EDT. Pt verbalized understanding of discharge instructions, follow-up care and prescriptions. VSS. Skin warm and dry. A&O x4.

## 2017-08-29 NOTE — ED Notes (Signed)
ED Provider at bedside. 

## 2017-08-29 NOTE — ED Triage Notes (Signed)
Pt arrived with complaints of shortness of breath, headache, generalized aches, and chills since last night. Pt's room air saturation 100% and pt reports a non productive cough. Pt states she took 1 benadryl this am at 0700. Pt in NAD in triage

## 2017-09-04 ENCOUNTER — Emergency Department
Admission: EM | Admit: 2017-09-04 | Discharge: 2017-09-04 | Discharge status: Against Medical Advice | Payer: Medicare Other

## 2017-09-04 NOTE — ED Notes (Signed)
Pt no visualized in the WR with no response when called.

## 2017-09-04 NOTE — ED Notes (Signed)
Pt called and with no response and is not visualized at present. Will make one final attempt in .

## 2017-09-04 NOTE — ED Notes (Signed)
Pt called in the WR with no response 

## 2017-09-24 ENCOUNTER — Encounter: Payer: Self-pay | Admitting: Emergency Medicine

## 2017-09-24 ENCOUNTER — Emergency Department: Payer: Medicare Other

## 2017-09-24 ENCOUNTER — Emergency Department
Admission: EM | Admit: 2017-09-24 | Discharge: 2017-09-24 | Disposition: A | Discharge status: Home / Self Care | Payer: Medicare Other | Attending: Student in an Organized Health Care Education/Training Program | Admitting: Student in an Organized Health Care Education/Training Program

## 2017-09-24 ENCOUNTER — Other Ambulatory Visit: Payer: Self-pay

## 2017-09-24 DIAGNOSIS — R9431 Abnormal electrocardiogram [ECG] [EKG]: Secondary | ICD-10-CM | POA: Insufficient documentation

## 2017-09-24 DIAGNOSIS — R0602 Shortness of breath: Secondary | ICD-10-CM | POA: Diagnosis not present

## 2017-09-24 DIAGNOSIS — R42 Dizziness and giddiness: Secondary | ICD-10-CM | POA: Diagnosis not present

## 2017-09-24 DIAGNOSIS — F17210 Nicotine dependence, cigarettes, uncomplicated: Secondary | ICD-10-CM | POA: Diagnosis not present

## 2017-09-24 LAB — TROPONIN I

## 2017-09-24 LAB — BASIC METABOLIC PANEL
Anion gap: 8 (ref 5–15)
BUN: 9 mg/dL (ref 8–23)
CALCIUM: 9 mg/dL (ref 8.9–10.3)
CO2: 26 mmol/L (ref 22–32)
Chloride: 104 mmol/L (ref 98–111)
Creatinine, Ser: 0.82 mg/dL (ref 0.44–1.00)
Glucose, Bld: 86 mg/dL (ref 70–99)
Potassium: 3.6 mmol/L (ref 3.5–5.1)
SODIUM: 138 mmol/L (ref 135–145)

## 2017-09-24 LAB — CBC
HCT: 41.9 % (ref 35.0–47.0)
Hemoglobin: 14.7 g/dL (ref 12.0–16.0)
MCH: 31 pg (ref 26.0–34.0)
MCHC: 35.1 g/dL (ref 32.0–36.0)
MCV: 88.5 fL (ref 80.0–100.0)
PLATELETS: 299 10*3/uL (ref 150–440)
RBC: 4.73 MIL/uL (ref 3.80–5.20)
RDW: 13.1 % (ref 11.5–14.5)
WBC: 6.4 10*3/uL (ref 3.6–11.0)

## 2017-09-24 MED ORDER — IPRATROPIUM-ALBUTEROL 0.5-2.5 (3) MG/3ML IN SOLN: 3.0000 mL | Freq: Once | RESPIRATORY_TRACT | Status: DC

## 2017-09-24 MED ORDER — NICOTINE 21 MG/24HR TD PT24
21.0000 mg | MEDICATED_PATCH | Freq: Once | TRANSDERMAL | Status: DC
  Administered 2017-09-24: 21 mg via TRANSDERMAL
  Filled 2017-09-24: qty 1

## 2017-09-24 NOTE — ED Triage Notes (Signed)
Pt to ED sent from [facility] for abnormal EKG and further evaluation.  Patient states SOB for a couple days, denies cough, denies pain, denies swelling, denies cardiac hx.  Nothing changes SOB.  Pt talking in complete and coherent sentences, color WNL, skin warm and dry, chest rise even and unlabored.

## 2017-09-24 NOTE — Discharge Instructions (Addendum)
Please follow-up with your primary care physician.  I also given you referral to cardiology.  Please return immediately if you have worsening symptoms including worsening shortness of breath, chest discomfort or pressure, palpitations, weakness or numbness or tingling.

## 2017-09-24 NOTE — ED Provider Notes (Signed)
[facility] Emergency Department Provider Note    First MD Initiated Contact with Patient 09/24/17 1416     (approximate)  I have reviewed the triage vital signs and the nursing notes.   HISTORY  Chief Complaint Shortness of Breath and Abnormal ECG    HPI Kaylee Robinson is a 72 y.o. female this is a ER from urgent care for abnormal EKG.  Patient states that she went to urgent care this morning because around 8:00 she started feeling "swimmy headed.  The symptoms lasted roughly 1 or 2 minutes.  They are not associated with chest pain but she did feel some shortness of breath.  She was not diaphoretic.  No nausea or vomiting.  States that she has had similar episodes in the past.  She does smoke 1 pack/day and would like some help with cutting back on smoking.  Denies any fevers.  Denies any chest pain or shortness of breath at this time.  Denies any chest pressure.  No numbness or tingling.  No headache.  No recent fevers.    Past Medical History:  Diagnosis Date  . Arthritis    bilateral knees   Family History  Problem Relation Age of Onset  . Breast cancer Neg Hx    Past Surgical History:  Procedure Laterality Date  . TONSILLECTOMY     adnoidectomy   There are no active problems to display for this patient.     Prior to Admission medications   Medication Sig Start Date End Date Taking? Authorizing Provider  albuterol (PROVENTIL HFA;VENTOLIN HFA) 108 (90 Base) MCG/ACT inhaler Inhale 2 puffs into the lungs every 6 (six) hours as needed for wheezing or shortness of breath. 09/11/15   Phineas Semen, MD  benzonatate (TESSALON PERLES) 100 MG capsule Take 1 capsule (100 mg total) by mouth 3 (three) times daily as needed for cough (Take 1-2 per dose). 03/15/16   Menshew, Charlesetta Ivory, PA-C    Allergies Patient has no known allergies.    Social History Social History   Tobacco Use  . Smoking status: Current Some Day Smoker    Packs/day:  1.00    Types: Cigarettes  . Smokeless tobacco: Never Used  Substance Use Topics  . Alcohol use: No  . Drug use: Never    Review of Systems Patient denies headaches, rhinorrhea, blurry vision, numbness, shortness of breath, chest pain, edema, cough, abdominal pain, nausea, vomiting, diarrhea, dysuria, fevers, rashes or hallucinations unless otherwise stated above in HPI. ____________________________________________   PHYSICAL EXAM:  VITAL SIGNS: Vitals:   09/24/17 1255  BP: 140/74  Pulse: 62  Resp: 14  Temp: 97.9 F (36.6 C)  SpO2: 99%    Constitutional: Alert and oriented.  Eyes: Conjunctivae are normal.  Head: Atraumatic. Nose: No congestion/rhinnorhea. Mouth/Throat: Mucous membranes are moist.   Neck: No stridor. Painless ROM.  Cardiovascular: Normal rate, regular rhythm. Grossly normal heart sounds.  Good peripheral circulation. Respiratory: Normal respiratory effort.  No retractions. Lungs with coarse bibasilar breathsounds Gastrointestinal: Soft and nontender. No distention. No abdominal bruits. No CVA tenderness. Genitourinary:  Musculoskeletal: No lower extremity tenderness nor edema.  No joint effusions. Neurologic:  CN- intact.  No facial droop, Normal FNF.  Normal heel to shin.  Sensation intact bilaterally. Normal speech and language. No gross focal neurologic deficits are appreciated. No gait instability. Skin:  Skin is warm, dry and intact. No rash noted. Psychiatric: Mood and affect are normal. Speech and behavior are normal.  ____________________________________________  LABS (all labs ordered are listed, but only abnormal results are displayed)  Results for orders placed or performed during the hospital encounter of 09/24/17 (from the past 24 hour(s))  Basic metabolic panel     Status: None   Collection Time: 09/24/17 12:58 PM  Result Value Ref Range   Sodium 138 135 - 145 mmol/L   Potassium 3.6 3.5 - 5.1 mmol/L   Chloride 104 98 - 111 mmol/L    CO2 26 22 - 32 mmol/L   Glucose, Bld 86 70 - 99 mg/dL   BUN 9 8 - 23 mg/dL   Creatinine, Ser 1.610.82 0.44 - 1.00 mg/dL   Calcium 9.0 8.9 - 09.610.3 mg/dL   GFR calc non Af Amer >60 >60 mL/min   GFR calc Af Amer >60 >60 mL/min   Anion gap 8 5 - 15  CBC     Status: None   Collection Time: 09/24/17 12:58 PM  Result Value Ref Range   WBC 6.4 3.6 - 11.0 K/uL   RBC 4.73 3.80 - 5.20 MIL/uL   Hemoglobin 14.7 12.0 - 16.0 g/dL   HCT 04.541.9 40.935.0 - 81.147.0 %   MCV 88.5 80.0 - 100.0 fL   MCH 31.0 26.0 - 34.0 pg   MCHC 35.1 32.0 - 36.0 g/dL   RDW 91.413.1 78.211.5 - 95.614.5 %   Platelets 299 150 - 440 K/uL  Troponin I     Status: None   Collection Time: 09/24/17 12:58 PM  Result Value Ref Range   Troponin I <0.03 <0.03 ng/mL   ____________________________________________  EKG My review and personal interpretation at Time: 12:49   Indication: dizziness  Rate: 60  Rhythm: sinus Axis: normal Other: normal intervals, t wave inversions in V1-V5 unchanged from previous ecg 08/30/17 ____________________________________________  RADIOLOGY  I personally reviewed all radiographic images ordered to evaluate for the above acute complaints and reviewed radiology reports and findings.  These findings were personally discussed with the patient.  Please see medical record for radiology report.  ____________________________________________   PROCEDURES  Procedure(s) performed:  Procedures    Critical Care performed: no ____________________________________________   INITIAL IMPRESSION / ASSESSMENT AND PLAN / ED COURSE  Pertinent labs & imaging results that were available during my care of the patient were reviewed by me and considered in my medical decision making (see chart for details).   DDX: Dysrhythmia, ACS, dissection, COPD, emphysema, panic, anemia  Kaylee Robinson is a 72 y.o. who presents to the ED with symptoms as described above.  She is completely asymptomatic at this time.  Not clinically consistent  with CVA, TIA, ACS, dissection, PE.  No evidence of pneumonia.  Does have hyperinflated lungs with some wheeze on exam which is not hypoxic and states that she feels well at this time.  Does not feel like her previous COPD exacerbations.  She is requesting a trial of nicotine patch and would like assistance with cutting back on smoking.  Roughly 10 minutes of smoking cessation education provided.  Her EKG is unchanged from previous and her troponin is negative.  Is not clinically consistent with ACS.  At this point do believe patient stable and appropriate for outpatient follow-up.      As part of my medical decision making, I reviewed the following data within the electronic MEDICAL RECORD NUMBER Nursing notes reviewed and incorporated, Labs reviewed, notes from prior ED visits.  ____________________________________________   FINAL CLINICAL IMPRESSION(S) / ED DIAGNOSES  Final diagnoses:  Dizziness  NEW MEDICATIONS STARTED DURING THIS VISIT:  New Prescriptions   No medications on file     Note:  This document was prepared using Dragon voice recognition software and may include unintentional dictation errors.    Willy Eddy, MD 09/24/17 1430

## 2017-09-24 NOTE — ED Notes (Signed)
AAOx3.  Skin warm and dry. NAD  Denies all complaints, states "Can I just go home now".

## 2017-09-24 NOTE — ED Notes (Signed)
AAOx3.  Skin warm and dry.  NAD 

## 2017-10-20 ENCOUNTER — Emergency Department
Admission: EM | Admit: 2017-10-20 | Discharge: 2017-10-20 | Disposition: A | Discharge status: Home / Self Care | Payer: Medicare Other | Attending: Emergency Medicine | Admitting: Emergency Medicine

## 2017-10-20 ENCOUNTER — Other Ambulatory Visit: Payer: Self-pay

## 2017-10-20 DIAGNOSIS — J3489 Other specified disorders of nose and nasal sinuses: Secondary | ICD-10-CM | POA: Diagnosis present

## 2017-10-20 DIAGNOSIS — J0101 Acute recurrent maxillary sinusitis: Secondary | ICD-10-CM | POA: Diagnosis not present

## 2017-10-20 DIAGNOSIS — J0100 Acute maxillary sinusitis, unspecified: Secondary | ICD-10-CM

## 2017-10-20 DIAGNOSIS — F17210 Nicotine dependence, cigarettes, uncomplicated: Secondary | ICD-10-CM | POA: Diagnosis not present

## 2017-10-20 MED ORDER — AMOXICILLIN-POT CLAVULANATE 875-125 MG PO TABS: 1.0000 | ORAL_TABLET | Freq: Two times a day (BID) | ORAL | 0 refills | Status: AC

## 2017-10-20 NOTE — ED Provider Notes (Signed)
[facility] Emergency Department Provider Note  ____________________________________________  Time seen: Approximately 5:18 PM  I have reviewed the triage vital signs and the nursing notes.   HISTORY  Chief Complaint Headache and Nasal Congestion    HPI Kaylee Robinson is a 72 y.o. female presents to the emergency department with maxillary and frontal sinus tenderness for approximately 1 week.  Patient has a history of seasonal allergies.  Patient reports that she has a "swimmy headed" feeling.  Patient symptoms are worse when she leans forward.  Patient typically has 1-2 episodes of sinusitis annually.  No fever or chills.  No alleviating measures have been attempted.   Past Medical History:  Diagnosis Date  . Arthritis    bilateral knees    There are no active problems to display for this patient.   Past Surgical History:  Procedure Laterality Date  . TONSILLECTOMY     adnoidectomy    Prior to Admission medications   Medication Sig Start Date End Date Taking? Authorizing Provider  traMADol (ULTRAM) 50 MG tablet Take by mouth 1 day or 1 dose.   Yes [provider]  albuterol (PROVENTIL HFA;VENTOLIN HFA) 108 (90 Base) MCG/ACT inhaler Inhale 2 puffs into the lungs every 6 (six) hours as needed for wheezing or shortness of breath. 09/11/15   Phineas SemenGoodman, Graydon, MD  amoxicillin-clavulanate (AUGMENTIN) 875-125 MG tablet Take 1 tablet by mouth 2 (two) times daily for 10 days. 10/20/17 10/30/17  Orvil FeilWoods, Nevena Rozenberg M, PA-C  benzonatate (TESSALON PERLES) 100 MG capsule Take 1 capsule (100 mg total) by mouth 3 (three) times daily as needed for cough (Take 1-2 per dose). 03/15/16   Menshew, Charlesetta IvoryJenise V Bacon, PA-C    Allergies Enalapril; Naproxen; Amoxicillin; and Sulfa antibiotics  Family History  Problem Relation Age of Onset  . Breast cancer Neg Hx     Social History Social History   Tobacco Use  . Smoking status: Current Some Day Smoker    Packs/day:  1.00    Types: Cigarettes  . Smokeless tobacco: Never Used  Substance Use Topics  . Alcohol use: No  . Drug use: Never     Review of Systems  Constitutional: No fever/chills Eyes: No visual changes. No discharge ENT: Patient has congestion.  Cardiovascular: no chest pain. Respiratory: no cough. No SOB. Gastrointestinal: No abdominal pain.  No nausea, no vomiting.  No diarrhea.  No constipation. Genitourinary: Negative for dysuria. No hematuria Musculoskeletal: Negative for musculoskeletal pain. Skin: Negative for rash, abrasions, lacerations, ecchymosis. Neurological: Patient has headache, no focal weakness or numbness.   ____________________________________________   PHYSICAL EXAM:  VITAL SIGNS: ED Triage Vitals  Enc Vitals Group     BP 10/20/17 1606 (!) 159/80     Pulse Rate 10/20/17 1606 65     Resp 10/20/17 1606 20     Temp 10/20/17 1606 98.5 F (36.9 C)     Temp Source 10/20/17 1606 Oral     SpO2 10/20/17 1606 98 %     Weight 10/20/17 1632 120 lb (54.4 kg)     Height 10/20/17 1632 5\' 4"  (1.626 m)     Head Circumference --      Peak Flow --      Pain Score --      Pain Loc --      Pain Edu? --      Excl. in GC? --      Constitutional: Alert and oriented. Well appearing and in no acute distress. Eyes: Conjunctivae are  normal. PERRL. EOMI. Head: Atraumatic.  Patient has maxillary and frontal sinus tenderness to palpation. ENT:      Ears: TMs are mildly injected and effused.       Nose:  nasal turbinates are edematous.      Mouth/Throat: Mucous membranes are moist.  Posterior pharynx is not erythematous. Neck: No stridor.  No cervical spine tenderness to palpation.  Cardiovascular: Normal rate, regular rhythm. Normal S1 and S2.  Good peripheral circulation. Respiratory: Normal respiratory effort without tachypnea or retractions. Lungs CTAB. Good air entry to the bases with no decreased or absent breath sounds.  Skin:  Skin is warm, dry and intact. No rash  noted. Psychiatric: Mood and affect are normal. Speech and behavior are normal. Patient exhibits appropriate insight and judgement.   ____________________________________________   LABS (all labs ordered are listed, but only abnormal results are displayed)  Labs Reviewed - No data to display ____________________________________________  EKG   ____________________________________________  RADIOLOGY  No results found.  ____________________________________________    PROCEDURES  Procedure(s) performed:    Procedures    Medications - No data to display   ____________________________________________   INITIAL IMPRESSION / ASSESSMENT AND PLAN / ED COURSE  Pertinent labs & imaging results that were available during my care of the patient were reviewed by me and considered in my medical decision making (see chart for details).  Review of the [place] CSRS was performed in accordance of the [facility] prior to dispensing any controlled drugs.      Assessment and plan Sinusitis Patient presents to the emergency department with maxillary and frontal sinus tenderness as well as headache for approximately 1 week.  Patient has a history of seasonal allergies and typically has 1-2 episodes of sinusitis annually.  History and physical exam findings are consistent with sinusitis.  Patient was treated empirically with Augmentin after patient assured me that she tolerates amoxicillin. All patient questions were answered.    ____________________________________________  FINAL CLINICAL IMPRESSION(S) / ED DIAGNOSES  Final diagnoses:  Acute non-recurrent maxillary sinusitis      NEW MEDICATIONS STARTED DURING THIS VISIT:  ED Discharge Orders        Ordered    amoxicillin-clavulanate (AUGMENTIN) 875-125 MG tablet  2 times daily     10/20/17 1709          This chart was dictated using voice recognition software/Dragon. Despite best efforts to proofread, errors can occur which  can change the meaning. Any change was purely unintentional.    Orvil Feil, PA-C 10/20/17 1725    Dionne Bucy, MD 10/20/17 [phone]

## 2017-10-20 NOTE — ED Triage Notes (Signed)
Patient reports woke with headache and sinus congestion this morning.

## 2017-10-20 NOTE — ED Notes (Signed)
Pt reports  Headache sensation of sinus pressure Intemittant shortness of shortness of breath  Symptoms began several days ago  Nasal congestion denies any cough pt is a smoker

## 2018-04-24 ENCOUNTER — Emergency Department
Admission: EM | Admit: 2018-04-24 | Discharge: 2018-04-24 | Discharge status: Against Medical Advice | Payer: Medicare Other | Attending: Emergency Medicine | Admitting: Emergency Medicine

## 2018-04-24 ENCOUNTER — Emergency Department: Payer: Medicare Other

## 2018-04-24 ENCOUNTER — Other Ambulatory Visit: Payer: Self-pay

## 2018-04-24 DIAGNOSIS — E860 Dehydration: Secondary | ICD-10-CM | POA: Diagnosis not present

## 2018-04-24 DIAGNOSIS — F17210 Nicotine dependence, cigarettes, uncomplicated: Secondary | ICD-10-CM | POA: Diagnosis not present

## 2018-04-24 DIAGNOSIS — R55 Syncope and collapse: Secondary | ICD-10-CM | POA: Insufficient documentation

## 2018-04-24 DIAGNOSIS — N289 Disorder of kidney and ureter, unspecified: Secondary | ICD-10-CM | POA: Insufficient documentation

## 2018-04-24 DIAGNOSIS — R42 Dizziness and giddiness: Secondary | ICD-10-CM

## 2018-04-24 DIAGNOSIS — Z5320 Procedure and treatment not carried out because of patient's decision for unspecified reasons: Secondary | ICD-10-CM | POA: Insufficient documentation

## 2018-04-24 DIAGNOSIS — R0602 Shortness of breath: Secondary | ICD-10-CM | POA: Diagnosis present

## 2018-04-24 LAB — BASIC METABOLIC PANEL
Anion gap: 10 (ref 5–15)
BUN: 7 mg/dL — AB (ref 8–23)
CHLORIDE: 101 mmol/L (ref 98–111)
CO2: 25 mmol/L (ref 22–32)
CREATININE: 1.02 mg/dL — AB (ref 0.44–1.00)
Calcium: 9.1 mg/dL (ref 8.9–10.3)
GFR calc Af Amer: >60 mL/min (ref >60)
GFR calc non Af Amer: 55 mL/min — ABNORMAL LOW (ref >60)
GLUCOSE: 84 mg/dL (ref 70–99)
Potassium: 3.3 mmol/L — ABNORMAL LOW (ref 3.5–5.1)
SODIUM: 136 mmol/L (ref 135–145)

## 2018-04-24 LAB — CBC
HEMATOCRIT: 47 % — AB (ref 36.0–46.0)
Hemoglobin: 15.3 g/dL — ABNORMAL HIGH (ref 12.0–15.0)
MCH: 29.3 pg (ref 26.0–34.0)
MCHC: 32.6 g/dL (ref 30.0–36.0)
MCV: 90 fL (ref 80.0–100.0)
Platelets: 368 10*3/uL (ref 150–400)
RBC: 5.22 MIL/uL — ABNORMAL HIGH (ref 3.87–5.11)
RDW: 12.2 % (ref 11.5–15.5)
WBC: 9.2 10*3/uL (ref 4.0–10.5)
nRBC: 0 % (ref 0.0–0.2)

## 2018-04-24 LAB — URINALYSIS, COMPLETE (UACMP) WITH MICROSCOPIC
BACTERIA UA: NONE SEEN
BILIRUBIN URINE: NEGATIVE
Glucose, UA: NEGATIVE mg/dL
Hgb urine dipstick: NEGATIVE
Ketones, ur: NEGATIVE mg/dL
Leukocytes, UA: NEGATIVE
Nitrite: NEGATIVE
PROTEIN: NEGATIVE mg/dL
SPECIFIC GRAVITY, URINE: 1.006 (ref 1.005–1.030)
pH: 6 (ref 5.0–8.0)

## 2018-04-24 LAB — TROPONIN I: Troponin I: <0.03 ng/mL (ref <0.03)

## 2018-04-24 MED ORDER — ACETAMINOPHEN 325 MG PO TABS
650.0000 mg | ORAL_TABLET | Freq: Once | ORAL | Status: AC
  Administered 2018-04-24: 650 mg via ORAL
  Filled 2018-04-24: qty 2

## 2018-04-24 NOTE — ED Notes (Signed)
Oxygen ranges from 93-100% with walking. Pt walks safely and without any distress.

## 2018-04-24 NOTE — ED Notes (Signed)
Pt given 3 cups of water for po hydration

## 2018-04-24 NOTE — ED Notes (Signed)
Pt standing at doorway asking how much longer. Pt states that she cannot wait. Female family member continues to state that they cannot stay. This RN and Clinton Sawyer, RN as witness that pt wants to leave.

## 2018-04-24 NOTE — ED Notes (Signed)
Pt drank 24 oz of water

## 2018-04-24 NOTE — ED Notes (Addendum)
Pt not in room when this RN looking into room. Pt walks back with patient relations, Luanne and asks how much longer. States that she is ready to go. This RN explained that she would request doctor to come in room to update patient as soon as I see doctor. Pt states that she cannot wait. Standing at nurses station, given paperwork. Pt refusing to wait to see doctor for results.

## 2018-04-24 NOTE — ED Triage Notes (Signed)
States woke up this AM feeling SOB. States worse when lying flat. Coughing in triage. Denies leg swelling or weight gain. Smoker. Denies COPD. A&O, speaking in complete sentences. Ambulatory.

## 2018-04-24 NOTE — Discharge Instructions (Signed)
Drink plenty of fluid to stay well-hydrated, and to improve your kidney function.  Please have your primary care physician recheck your kidney function and your vital signs in 2 days.  Return to the emergency department if you develop severe pain, lightheadedness or fainting, fever, or any other symptoms concerning to you.

## 2018-04-24 NOTE — ED Notes (Signed)
Pt standing in doorway asking how much longer her wait is,.

## 2018-04-24 NOTE — ED Provider Notes (Addendum)
[facility] Emergency Department Provider Note  ____________________________________________  Time seen: Approximately 4:17 PM  I have reviewed the triage vital signs and the nursing notes.   HISTORY  Chief Complaint Shortness of Breath    HPI Kaylee Robinson is a 73 y.o. female with ongoing tobacco abuse, presenting with "swimmy headedness" and shortness of breath.  The patient reports that for the past 2 days, she has been feeling swimmy headed when she walks, and short of breath.  She has not had any cough or cold symptoms, including congestion, rhinorrhea, sore throat or ear pain.  No fevers or chills.  She has not had any nausea vomiting or diarrhea.  She is a heavy smoker and does not use oxygen.  She denies any lower extremity edema or calf pain.  She has not had any sick contacts.  Past Medical History:  Diagnosis Date  . Arthritis    bilateral knees    There are no active problems to display for this patient.   Past Surgical History:  Procedure Laterality Date  . TONSILLECTOMY     adnoidectomy    Current Outpatient Rx  . Order #: [id number] Class: Print  . Order #: [id number] Class: Print  . Order #: [id number] Class: Historical Med    Allergies Enalapril; Naproxen; Amoxicillin; and Sulfa antibiotics  Family History  Problem Relation Age of Onset  . Breast cancer Neg Hx     Social History Social History   Tobacco Use  . Smoking status: Current Some Day Smoker    Packs/day: 1.00    Types: Cigarettes  . Smokeless tobacco: Never Used  Substance Use Topics  . Alcohol use: No  . Drug use: Never    Review of Systems Constitutional: No fever/chills.  Positive swimmy headedness.  No syncope. Eyes: No visual changes.  No blurred or double vision.  No eye discharge. ENT: No sore throat. No congestion or rhinorrhea.  No ear pain. Cardiovascular: Denies chest pain. Denies palpitations. Respiratory: Positive exertional shortness of breath.   No cough. Gastrointestinal: No abdominal pain.  No nausea, no vomiting.  No diarrhea.  No constipation. Genitourinary: Negative for dysuria.  No urinary frequency.  Musculoskeletal: Negative for back pain.  No lower extremity swelling or calf pain. Skin: Negative for rash. Neurological: Negative for headaches. No focal numbness, tingling or weakness.     ____________________________________________   PHYSICAL EXAM:  VITAL SIGNS: ED Triage Vitals [04/24/18 1416]  Enc Vitals Group     BP 131/67     Pulse Rate 72     Resp 18     Temp 98 F (36.7 C)     Temp Source Oral     SpO2 100 %     Weight 130 lb (59 kg)     Height 5\' 5"  (1.651 m)     Head Circumference      Peak Flow      Pain Score 0     Pain Loc      Pain Edu?      Excl. in [place]?     Constitutional: Alert and oriented. Answers questions appropriately.  Chronically ill-appearing. Eyes: Conjunctivae are normal.  EOMI. No scleral icterus.  No eye discharge. Head: Atraumatic. Nose: No congestion/rhinnorhea. Mouth/Throat: Mucous membranes are dry.  Neck: No stridor.  Supple.  Mild JVD.  No meningismus. Cardiovascular: Normal rate, regular rhythm. No murmurs, rubs or gallops.  Respiratory: Prolonged expiratory phase without tachypnea, accessory muscle use or retractions.  The patient has fair air exchange  with O2 sats of 100% on room air at rest.  No wheezes rales or rhonchi.  She is able to speak in full sentences without difficulty. Gastrointestinal: Soft, nontender and nondistended.  No guarding or rebound.  No peritoneal signs. Musculoskeletal: No LE edema. No ttp in the calves or palpable cords.  Negative Homan's sign. Neurologic:  A&Ox3.  Speech is clear.  Face and smile are symmetric.  EOMI.  Moves all extremities well. Skin:  Skin is warm, dry and intact. No rash noted. Psychiatric: Mood and affect are normal.  ____________________________________________   LABS (all labs ordered are listed, but only abnormal  results are displayed)  Labs Reviewed  BASIC METABOLIC PANEL - Abnormal; Notable for the following components:      Result Value   Potassium 3.3 (*)    BUN 7 (*)    Creatinine, Ser 1.02 (*)    GFR calc non Af Amer 55 (*)    All other components within normal limits  CBC - Abnormal; Notable for the following components:   RBC 5.22 (*)    Hemoglobin 15.3 (*)    HCT 47.0 (*)    All other components within normal limits  TROPONIN I  URINALYSIS, COMPLETE (UACMP) WITH MICROSCOPIC   ____________________________________________  EKG  ED ECG REPORT I, Anne-Caroline Sharma Covert, the attending physician, personally viewed and interpreted this ECG.   Date: 04/24/2018  EKG Time: 1413  Rate: 70  Rhythm: normal sinus rhythm  Axis: normal  Intervals:none  ST&T Change: No STEMI  ____________________________________________  RADIOLOGY  Dg Chest 2 View  Result Date: 04/24/2018 CLINICAL DATA:  Chest tightness and shortness of breath. Smoker. EXAM: CHEST - 2 VIEW COMPARISON:  09/24/2017 FINDINGS: The cardiomediastinal silhouette is unchanged with normal heart size. Thoracic aortic tortuosity and atherosclerotic calcification are noted. The lungs remain hyperinflated with suspected underlying emphysema. No airspace consolidation, edema, pleural effusion, pneumothorax is identified. No acute osseous abnormality is seen. IMPRESSION: Chronic hyperinflation/suspected COPD. No evidence of active cardiopulmonary disease. Electronically Signed   By: Sebastian Ache M.D.   On: 04/24/2018 14:48    ____________________________________________   PROCEDURES  Procedure(s) performed: None  Procedures  Critical Care performed: No ____________________________________________   INITIAL IMPRESSION / ASSESSMENT AND PLAN / ED COURSE  Pertinent labs & imaging results that were available during my care of the patient were reviewed by me and considered in my medical decision making (see chart for details).  73  y.o. female with ongoing tobacco abuse presenting with 2 days of swimmy headedness, and shortness of breath with exertion.  Overall, the patient is hemodynamically stable and her oxygen saturations are reassuring at rest.  We will get an ambulatory pulse oximetry reading to see if she is de-satting with ambulation.  She has no evidence of infection on her chest x-ray, no pneumonia, and clinically has not been having any cough or cold symptoms, and has a normal white blood cell count.  Infectious process is less likely.  Worsening of her underlying lung disease due to ongoing tobacco abuse is a likely cause.  In addition, the patient does have a mild renal insufficiency today and appears hemoconcentrated on her CBC.  She does have dry mucous membranes, and may be mildly dehydrated.  We will initiate oral rehydration and I have spoken to her at length about the importance of drinking plenty of clear liquids at home.  I do not see any evidence of pulmonary edema or peripheral edema which would be concerning for new onset CHF.  She will follow-up with her primary care physician in 2 days for reevaluation of her creatinine and her hydration status.  The patient is concerned that she may have the flu, but is not having any flulike symptoms, and flu test packages are in shortage; she does not meet the criteria for testing today.  I have let her know that if she develops worsening symptoms, she should follow-up either with her PCP or here.  Follow-up instructions as well as return precautions were discussed.  ----------------------------------------- 4:45 PM on 04/24/2018 -----------------------------------------  I have ordered a urinalysis, which the patient has been unable to provide an she and her husband are adamant that they want to leave prior to treatment being complete.  Her O2 sats were checked with ambulation and were reassuring.  She has been given water for oral rehydration and instructed to follow-up  with her PMD as above.  ____________________________________________  FINAL CLINICAL IMPRESSION(S) / ED DIAGNOSES  Final diagnoses:  Lightheadedness  Exertional shortness of breath  Acute renal insufficiency  Dehydration         NEW MEDICATIONS STARTED DURING THIS VISIT:  New Prescriptions   No medications on file      Rockne MenghiniNorman, Anne-Caroline, MD 04/24/18 1625    Rockne MenghiniNorman, Anne-Caroline, MD 04/24/18 [phone]

## 2018-05-14 ENCOUNTER — Other Ambulatory Visit: Payer: Self-pay | Admitting: Internal Medicine

## 2018-05-14 DIAGNOSIS — Z1231 Encounter for screening mammogram for malignant neoplasm of breast: Secondary | ICD-10-CM

## 2018-09-19 ENCOUNTER — Other Ambulatory Visit: Payer: Self-pay

## 2018-09-19 ENCOUNTER — Emergency Department: Payer: Medicare Other

## 2018-09-19 ENCOUNTER — Inpatient Hospital Stay
Admission: EM | Admit: 2018-09-19 | Discharge: 2018-09-23 | DRG: 482 | Disposition: A | Discharge status: Skilled Nursing Facility | Payer: Medicare Other | Attending: Nurse Practitioner | Admitting: Nurse Practitioner

## 2018-09-19 DIAGNOSIS — J449 Chronic obstructive pulmonary disease, unspecified: Secondary | ICD-10-CM | POA: Diagnosis present

## 2018-09-19 DIAGNOSIS — I959 Hypotension, unspecified: Secondary | ICD-10-CM | POA: Diagnosis present

## 2018-09-19 DIAGNOSIS — R509 Fever, unspecified: Secondary | ICD-10-CM

## 2018-09-19 DIAGNOSIS — F0280 Dementia in other diseases classified elsewhere without behavioral disturbance: Secondary | ICD-10-CM | POA: Diagnosis present

## 2018-09-19 DIAGNOSIS — F17210 Nicotine dependence, cigarettes, uncomplicated: Secondary | ICD-10-CM | POA: Diagnosis present

## 2018-09-19 DIAGNOSIS — Z1159 Encounter for screening for other viral diseases: Secondary | ICD-10-CM

## 2018-09-19 DIAGNOSIS — G479 Sleep disorder, unspecified: Secondary | ICD-10-CM | POA: Diagnosis present

## 2018-09-19 DIAGNOSIS — Z880 Allergy status to penicillin: Secondary | ICD-10-CM

## 2018-09-19 DIAGNOSIS — W010XXA Fall on same level from slipping, tripping and stumbling without subsequent striking against object, initial encounter: Secondary | ICD-10-CM | POA: Diagnosis present

## 2018-09-19 DIAGNOSIS — I8390 Asymptomatic varicose veins of unspecified lower extremity: Secondary | ICD-10-CM | POA: Diagnosis present

## 2018-09-19 DIAGNOSIS — M1990 Unspecified osteoarthritis, unspecified site: Secondary | ICD-10-CM | POA: Diagnosis present

## 2018-09-19 DIAGNOSIS — Y92007 Garden or yard of unspecified non-institutional (private) residence as the place of occurrence of the external cause: Secondary | ICD-10-CM

## 2018-09-19 DIAGNOSIS — Z419 Encounter for procedure for purposes other than remedying health state, unspecified: Secondary | ICD-10-CM

## 2018-09-19 DIAGNOSIS — S72142A Displaced intertrochanteric fracture of left femur, initial encounter for closed fracture: Secondary | ICD-10-CM | POA: Diagnosis not present

## 2018-09-19 DIAGNOSIS — Z79891 Long term (current) use of opiate analgesic: Secondary | ICD-10-CM

## 2018-09-19 DIAGNOSIS — I160 Hypertensive urgency: Secondary | ICD-10-CM | POA: Diagnosis present

## 2018-09-19 DIAGNOSIS — S72002A Fracture of unspecified part of neck of left femur, initial encounter for closed fracture: Secondary | ICD-10-CM | POA: Diagnosis present

## 2018-09-19 DIAGNOSIS — G309 Alzheimer's disease, unspecified: Secondary | ICD-10-CM | POA: Diagnosis present

## 2018-09-19 DIAGNOSIS — Z79899 Other long term (current) drug therapy: Secondary | ICD-10-CM

## 2018-09-19 DIAGNOSIS — S72009A Fracture of unspecified part of neck of unspecified femur, initial encounter for closed fracture: Secondary | ICD-10-CM

## 2018-09-19 DIAGNOSIS — W19XXXA Unspecified fall, initial encounter: Secondary | ICD-10-CM

## 2018-09-19 DIAGNOSIS — F329 Major depressive disorder, single episode, unspecified: Secondary | ICD-10-CM | POA: Diagnosis present

## 2018-09-19 DIAGNOSIS — K219 Gastro-esophageal reflux disease without esophagitis: Secondary | ICD-10-CM | POA: Diagnosis present

## 2018-09-19 LAB — URINALYSIS, COMPLETE (UACMP) WITH MICROSCOPIC
Bacteria, UA: NONE SEEN
Bilirubin Urine: NEGATIVE
Glucose, UA: NEGATIVE mg/dL
Hgb urine dipstick: NEGATIVE
Ketones, ur: 5 mg/dL — AB
Leukocytes,Ua: NEGATIVE
Nitrite: NEGATIVE
Protein, ur: NEGATIVE mg/dL
Specific Gravity, Urine: 1.015 (ref 1.005–1.030)
pH: 7 (ref 5.0–8.0)

## 2018-09-19 LAB — COMPREHENSIVE METABOLIC PANEL
ALT: 12 U/L (ref 0–44)
AST: 19 U/L (ref 15–41)
Albumin: 3.7 g/dL (ref 3.5–5.0)
Alkaline Phosphatase: 103 U/L (ref 38–126)
Anion gap: 11 (ref 5–15)
BUN: 12 mg/dL (ref 8–23)
CO2: 23 mmol/L (ref 22–32)
Calcium: 8.6 mg/dL — ABNORMAL LOW (ref 8.9–10.3)
Chloride: 99 mmol/L (ref 98–111)
Creatinine, Ser: 0.8 mg/dL (ref 0.44–1.00)
GFR calc Af Amer: >60 mL/min (ref >60)
GFR calc non Af Amer: >60 mL/min (ref >60)
Glucose, Bld: 101 mg/dL — ABNORMAL HIGH (ref 70–99)
Potassium: 3.9 mmol/L (ref 3.5–5.1)
Sodium: 133 mmol/L — ABNORMAL LOW (ref 135–145)
Total Bilirubin: 0.9 mg/dL (ref 0.3–1.2)
Total Protein: 6.6 g/dL (ref 6.5–8.1)

## 2018-09-19 LAB — APTT: aPTT: 31 seconds (ref 24–36)

## 2018-09-19 LAB — PROTIME-INR
INR: 1.1 (ref 0.8–1.2)
Prothrombin Time: 14.3 seconds (ref 11.4–15.2)

## 2018-09-19 LAB — CBC WITH DIFFERENTIAL/PLATELET
Abs Immature Granulocytes: 0.06 10*3/uL (ref 0.00–0.07)
Basophils Absolute: 0.1 10*3/uL (ref 0.0–0.1)
Basophils Relative: 1 %
Eosinophils Absolute: 0 10*3/uL (ref 0.0–0.5)
Eosinophils Relative: 0 %
HCT: 40.6 % (ref 36.0–46.0)
Hemoglobin: 13.3 g/dL (ref 12.0–15.0)
Immature Granulocytes: 1 %
Lymphocytes Relative: 15 %
Lymphs Abs: 1.6 10*3/uL (ref 0.7–4.0)
MCH: 29.6 pg (ref 26.0–34.0)
MCHC: 32.8 g/dL (ref 30.0–36.0)
MCV: 90.4 fL (ref 80.0–100.0)
Monocytes Absolute: 1 10*3/uL (ref 0.1–1.0)
Monocytes Relative: 10 %
Neutro Abs: 8 10*3/uL — ABNORMAL HIGH (ref 1.7–7.7)
Neutrophils Relative %: 73 %
Platelets: 253 10*3/uL (ref 150–400)
RBC: 4.49 MIL/uL (ref 3.87–5.11)
RDW: 12.3 % (ref 11.5–15.5)
WBC: 10.8 10*3/uL — ABNORMAL HIGH (ref 4.0–10.5)
nRBC: 0 % (ref 0.0–0.2)

## 2018-09-19 LAB — SARS CORONAVIRUS 2 BY RT PCR (HOSPITAL ORDER, PERFORMED IN ~~LOC~~ HOSPITAL LAB): SARS Coronavirus 2: NEGATIVE

## 2018-09-19 NOTE — ED Provider Notes (Signed)
[facility] Emergency Department Provider Note   ____________________________________________    I have reviewed the triage vital signs and the nursing notes.   HISTORY  Chief Complaint Fall and Fever  History of dementia   HPI Kaylee Robinson is a 73 y.o. female who presents after a reported mechanical fall.  Patient reports that she fell in her garden, is unclear whether this is the case currently.  EMS reports shortening of the left leg and pain with range of motion.  They also note that she had a fever  Past Medical History:  Diagnosis Date  . Arthritis    bilateral knees    There are no active problems to display for this patient.   Past Surgical History:  Procedure Laterality Date  . TONSILLECTOMY     adnoidectomy    Prior to Admission medications   Medication Sig Start Date End Date Taking? Authorizing Provider  acetaminophen (TYLENOL) 500 MG tablet Take 500 mg by mouth every 6 (six) hours as needed.    Yes [provider]  albuterol (PROVENTIL HFA;VENTOLIN HFA) 108 (90 Base) MCG/ACT inhaler Inhale 2 puffs into the lungs every 6 (six) hours as needed for wheezing or shortness of breath. 09/11/15  Yes Nance Pear, MD  cetirizine (ZYRTEC) 10 MG tablet Take 10 mg by mouth daily. 09/05/18  Yes [provider]  donepezil (ARICEPT) 5 MG tablet Take 5 mg by mouth at bedtime.   Yes [provider]  fluticasone (FLONASE) 50 MCG/ACT nasal spray USE 2 SPRAY(S) IN EACH NOSTRIL ONCE DAILY 09/05/18  Yes [provider]  memantine (NAMENDA) 5 MG tablet Take 5 mg by mouth 2 (two) times daily. 08/07/18  Yes [provider]  sertraline (ZOLOFT) 50 MG tablet Take 50 mg by mouth daily. 09/12/18  Yes [provider]  traMADol (ULTRAM) 50 MG tablet Take 50 mg by mouth every 12 (twelve) hours as needed.    Yes [provider]     Allergies Enalapril, Naproxen, Amoxicillin, and Sulfa  antibiotics  Family History  Problem Relation Age of Onset  . Breast cancer Neg Hx     Social History Social History   Tobacco Use  . Smoking status: Current Some Day Smoker    Packs/day: 1.00    Types: Cigarettes  . Smokeless tobacco: Never Used  Substance Use Topics  . Alcohol use: No  . Drug use: Never    Review of Systems limited by dementia  Patient complains of pain in her left hip and left knee, denies dysuria to me   ____________________________________________   PHYSICAL EXAM:  VITAL SIGNS: ED Triage Vitals  Enc Vitals Group     BP 09/19/18 2202 (!) 170/77     Pulse Rate 09/19/18 2202 73     Resp 09/19/18 2202 14     Temp 09/19/18 2202 100.2 F (37.9 C)     Temp Source 09/19/18 2202 Oral     SpO2 09/19/18 2202 93 %     Weight 09/19/18 2201 58.6 kg (129 lb 3 oz)     Height --      Head Circumference --      Peak Flow --      Pain Score --      Pain Loc --      Pain Edu? --      Excl. in [place]? --     Constitutional: Alert  Eyes: Conjunctivae are normal.  Head: Atraumatic. Nose: No neck pain Mouth/Throat:  Mucous membranes are moist.   Neck:  Painless ROM, no vertebral tenderness palpation Cardiovascular: Normal rate, regular rhythm. Peri [place].  Good peripheral circulation.  No chest wall tenderness to palpation Respiratory: Normal respiratory effort.  No retractions.  Gastrointestinal: Soft and nontender. No distention.  No CVA tenderness.  Musculoskeletal: Left leg is shortened and externally rotated, pain along the greater trochanter of the hip, 2+ pulses distally, no vertebral tenderness to palpation Neurologic:  Normal speech and language. No gross focal neurologic deficits are appreciated.  Skin:  Skin is warm, dry and intact. No rash noted. Psychiatric: Mood and affect are normal. Speech and behavior are normal.  ____________________________________________   LABS (all labs ordered are listed, but only abnormal results are displayed)  Labs  Reviewed  CBC WITH DIFFERENTIAL/PLATELET - Abnormal; Notable for the following components:      Result Value   WBC 10.8 (*)    Neutro Abs 8.0 (*)    All other components within normal limits  COMPREHENSIVE METABOLIC PANEL - Abnormal; Notable for the following components:   Sodium 133 (*)    Glucose, Bld 101 (*)    Calcium 8.6 (*)    All other components within normal limits  SARS CORONAVIRUS 2 (HOSPITAL ORDER, PERFORMED IN [facility])  URINE CULTURE  CULTURE, BLOOD (ROUTINE X 2)  CULTURE, BLOOD (ROUTINE X 2)  PROTIME-INR  APTT  URINALYSIS, COMPLETE (UACMP) WITH MICROSCOPIC  LACTIC ACID, PLASMA  LACTIC ACID, PLASMA  TYPE AND SCREEN   ____________________________________________  EKG  ED ECG REPORT I, Jene Everyobert Erza Mothershead, the attending physician, personally viewed and interpreted this ECG.  Date: 09/19/2018  Rhythm: normal sinus rhythm QRS Axis: normal Intervals: Mildly prolonged QTC ST/T Wave abnormalities: normal Narrative Interpretation: no evidence of acute ischemia  ____________________________________________  RADIOLOGY  X-ray demonstrates left hip fracture ____________________________________________   PROCEDURES  Procedure(s) performed: No  Procedures   Critical Care performed: No ____________________________________________   INITIAL IMPRESSION / ASSESSMENT AND PLAN / ED COURSE  Pertinent labs & imaging results that were available during my care of the patient were reviewed by me and considered in my medical decision making (see chart for details).  X-ray demonstrates left intertrochanteric fracture, pending COVID testing given her elevated temperature.  I have asked my colleague to follow-up and contact Ortho as appropriate   ____________________________________________   FINAL CLINICAL IMPRESSION(S) / ED DIAGNOSES  Final diagnoses:  Fall, initial encounter  Closed fracture of left hip, initial encounter (HCC)  Fever, unspecified  fever cause        Note:  This document was prepared using Dragon voice recognition software and may include unintentional dictation errors.   Jene EveryKinner, Zofia Peckinpaugh, MD 09/19/18 2351

## 2018-09-19 NOTE — ED Triage Notes (Signed)
Pt presents via EMS s/p fall with left hip pain. EMS reports shortening of left leg. Febrile per EMS. Possible UTI per EMS. Pt denies urinary symptoms.

## 2018-09-19 NOTE — ED Notes (Signed)
O2 sat noted at 91%. Pt placed on 3L O2 via nasal cannula. Dr Alfred Levins notified.

## 2018-09-20 ENCOUNTER — Inpatient Hospital Stay: Payer: Medicare Other | Admitting: Certified Registered Nurse Anesthetist

## 2018-09-20 ENCOUNTER — Encounter
Admission: EM | Disposition: A | Discharge status: Skilled Nursing Facility | Payer: Self-pay | Source: Home / Self Care | Attending: Internal Medicine

## 2018-09-20 ENCOUNTER — Inpatient Hospital Stay: Payer: Medicare Other

## 2018-09-20 DIAGNOSIS — I959 Hypotension, unspecified: Secondary | ICD-10-CM | POA: Diagnosis present

## 2018-09-20 DIAGNOSIS — J449 Chronic obstructive pulmonary disease, unspecified: Secondary | ICD-10-CM | POA: Diagnosis present

## 2018-09-20 DIAGNOSIS — F0280 Dementia in other diseases classified elsewhere without behavioral disturbance: Secondary | ICD-10-CM | POA: Diagnosis present

## 2018-09-20 DIAGNOSIS — Z79891 Long term (current) use of opiate analgesic: Secondary | ICD-10-CM | POA: Diagnosis not present

## 2018-09-20 DIAGNOSIS — G479 Sleep disorder, unspecified: Secondary | ICD-10-CM | POA: Diagnosis present

## 2018-09-20 DIAGNOSIS — I160 Hypertensive urgency: Secondary | ICD-10-CM | POA: Diagnosis present

## 2018-09-20 DIAGNOSIS — G309 Alzheimer's disease, unspecified: Secondary | ICD-10-CM | POA: Diagnosis present

## 2018-09-20 DIAGNOSIS — S72142A Displaced intertrochanteric fracture of left femur, initial encounter for closed fracture: Secondary | ICD-10-CM | POA: Diagnosis present

## 2018-09-20 DIAGNOSIS — W010XXA Fall on same level from slipping, tripping and stumbling without subsequent striking against object, initial encounter: Secondary | ICD-10-CM | POA: Diagnosis present

## 2018-09-20 DIAGNOSIS — Z1159 Encounter for screening for other viral diseases: Secondary | ICD-10-CM | POA: Diagnosis not present

## 2018-09-20 DIAGNOSIS — S72002A Fracture of unspecified part of neck of left femur, initial encounter for closed fracture: Secondary | ICD-10-CM | POA: Diagnosis present

## 2018-09-20 DIAGNOSIS — Z880 Allergy status to penicillin: Secondary | ICD-10-CM | POA: Diagnosis not present

## 2018-09-20 DIAGNOSIS — K219 Gastro-esophageal reflux disease without esophagitis: Secondary | ICD-10-CM | POA: Diagnosis present

## 2018-09-20 DIAGNOSIS — Z79899 Other long term (current) drug therapy: Secondary | ICD-10-CM | POA: Diagnosis not present

## 2018-09-20 DIAGNOSIS — M1990 Unspecified osteoarthritis, unspecified site: Secondary | ICD-10-CM | POA: Diagnosis present

## 2018-09-20 DIAGNOSIS — I8390 Asymptomatic varicose veins of unspecified lower extremity: Secondary | ICD-10-CM | POA: Diagnosis present

## 2018-09-20 DIAGNOSIS — F329 Major depressive disorder, single episode, unspecified: Secondary | ICD-10-CM | POA: Diagnosis present

## 2018-09-20 DIAGNOSIS — F17210 Nicotine dependence, cigarettes, uncomplicated: Secondary | ICD-10-CM | POA: Diagnosis present

## 2018-09-20 DIAGNOSIS — Y92007 Garden or yard of unspecified non-institutional (private) residence as the place of occurrence of the external cause: Secondary | ICD-10-CM | POA: Diagnosis not present

## 2018-09-20 HISTORY — PX: INTRAMEDULLARY (IM) NAIL INTERTROCHANTERIC: SHX5875

## 2018-09-20 LAB — BLOOD CULTURE ID PANEL (REFLEXED)

## 2018-09-20 LAB — BASIC METABOLIC PANEL
Anion gap: 8 (ref 5–15)
BUN: 12 mg/dL (ref 8–23)
CO2: 26 mmol/L (ref 22–32)
Calcium: 8.7 mg/dL — ABNORMAL LOW (ref 8.9–10.3)
Chloride: 100 mmol/L (ref 98–111)
Creatinine, Ser: 0.73 mg/dL (ref 0.44–1.00)
GFR calc Af Amer: >60 mL/min (ref >60)
GFR calc non Af Amer: >60 mL/min (ref >60)
Glucose, Bld: 107 mg/dL — ABNORMAL HIGH (ref 70–99)
Potassium: 3.7 mmol/L (ref 3.5–5.1)
Sodium: 134 mmol/L — ABNORMAL LOW (ref 135–145)

## 2018-09-20 LAB — CBC
HCT: 42.7 % (ref 36.0–46.0)
Hemoglobin: 13.8 g/dL (ref 12.0–15.0)
MCH: 29.5 pg (ref 26.0–34.0)
MCHC: 32.3 g/dL (ref 30.0–36.0)
MCV: 91.2 fL (ref 80.0–100.0)
Platelets: 268 10*3/uL (ref 150–400)
RBC: 4.68 MIL/uL (ref 3.87–5.11)
RDW: 12.3 % (ref 11.5–15.5)
WBC: 10.9 10*3/uL — ABNORMAL HIGH (ref 4.0–10.5)
nRBC: 0 % (ref 0.0–0.2)

## 2018-09-20 LAB — TYPE AND SCREEN
ABO/RH(D): B POS
Antibody Screen: NEGATIVE

## 2018-09-20 LAB — SURGICAL PCR SCREEN
MRSA, PCR: NEGATIVE
Staphylococcus aureus: NEGATIVE

## 2018-09-20 LAB — LACTIC ACID, PLASMA
Lactic Acid, Venous: 1.2 mmol/L (ref 0.5–1.9)
Lactic Acid, Venous: 1.5 mmol/L (ref 0.5–1.9)

## 2018-09-20 SURGERY — FIXATION, FRACTURE, INTERTROCHANTERIC, WITH INTRAMEDULLARY ROD
Anesthesia: Spinal | Laterality: Left

## 2018-09-20 MED ORDER — PROPOFOL 10 MG/ML IV BOLUS
INTRAVENOUS | Status: DC | PRN
  Administered 2018-09-20: 50 mg via INTRAVENOUS
  Administered 2018-09-20: 40 mg via INTRAVENOUS

## 2018-09-20 MED ORDER — LORATADINE 10 MG PO TABS
10.0000 mg | ORAL_TABLET | Freq: Every day | ORAL | Status: DC
  Administered 2018-09-20 – 2018-09-23 (×4): 10 mg via ORAL
  Filled 2018-09-20 (×4): qty 1

## 2018-09-20 MED ORDER — HYDROCODONE-ACETAMINOPHEN 7.5-325 MG PO TABS: 1.0000 | ORAL_TABLET | ORAL | Status: DC | PRN

## 2018-09-20 MED ORDER — FLUTICASONE PROPIONATE 50 MCG/ACT NA SUSP
2.0000 | Freq: Every day | NASAL | Status: DC
  Administered 2018-09-23: 2 via NASAL
  Filled 2018-09-20 (×2): qty 16

## 2018-09-20 MED ORDER — METHOCARBAMOL 1000 MG/10ML IJ SOLN
500.0000 mg | Freq: Four times a day (QID) | INTRAVENOUS | Status: DC | PRN
  Filled 2018-09-20: qty 5

## 2018-09-20 MED ORDER — DOCUSATE SODIUM 100 MG PO CAPS
100.0000 mg | ORAL_CAPSULE | Freq: Two times a day (BID) | ORAL | Status: DC
  Administered 2018-09-20 – 2018-09-23 (×6): 100 mg via ORAL
  Filled 2018-09-20 (×7): qty 1

## 2018-09-20 MED ORDER — ROCURONIUM BROMIDE 100 MG/10ML IV SOLN
INTRAVENOUS | Status: DC | PRN
  Administered 2018-09-20: 10 mg via INTRAVENOUS
  Administered 2018-09-20: 35 mg via INTRAVENOUS

## 2018-09-20 MED ORDER — TRAMADOL HCL 50 MG PO TABS
50.0000 mg | ORAL_TABLET | Freq: Four times a day (QID) | ORAL | Status: DC
  Administered 2018-09-20 – 2018-09-23 (×6): 50 mg via ORAL
  Filled 2018-09-20 (×6): qty 1

## 2018-09-20 MED ORDER — ONDANSETRON HCL 4 MG/2ML IJ SOLN: 4.0000 mg | Freq: Four times a day (QID) | INTRAMUSCULAR | Status: DC | PRN

## 2018-09-20 MED ORDER — METHOCARBAMOL 500 MG PO TABS: 500.0000 mg | ORAL_TABLET | Freq: Four times a day (QID) | ORAL | Status: DC | PRN

## 2018-09-20 MED ORDER — POTASSIUM CHLORIDE IN NACL 20-0.9 MEQ/L-% IV SOLN
INTRAVENOUS | Status: DC
  Administered 2018-09-20: 20:00:00 via INTRAVENOUS
  Filled 2018-09-20 (×6): qty 1000

## 2018-09-20 MED ORDER — ROCURONIUM BROMIDE 100 MG/10ML IV SOLN
INTRAVENOUS | Status: AC
  Filled 2018-09-20: qty 1

## 2018-09-20 MED ORDER — ONDANSETRON HCL 4 MG/2ML IJ SOLN
INTRAMUSCULAR | Status: DC | PRN
  Administered 2018-09-20: 4 mg via INTRAVENOUS

## 2018-09-20 MED ORDER — MEMANTINE HCL 5 MG PO TABS
5.0000 mg | ORAL_TABLET | Freq: Two times a day (BID) | ORAL | Status: DC
  Administered 2018-09-20 – 2018-09-23 (×7): 5 mg via ORAL
  Filled 2018-09-20 (×7): qty 1

## 2018-09-20 MED ORDER — SERTRALINE HCL 50 MG PO TABS
50.0000 mg | ORAL_TABLET | Freq: Every day | ORAL | Status: DC
  Administered 2018-09-20 – 2018-09-23 (×4): 50 mg via ORAL
  Filled 2018-09-20 (×4): qty 1

## 2018-09-20 MED ORDER — MORPHINE SULFATE (PF) 2 MG/ML IV SOLN: 0.5000 mg | INTRAVENOUS | Status: DC | PRN

## 2018-09-20 MED ORDER — ACETAMINOPHEN 325 MG PO TABS: 650.0000 mg | ORAL_TABLET | Freq: Four times a day (QID) | ORAL | Status: DC | PRN

## 2018-09-20 MED ORDER — OXYCODONE HCL 5 MG PO TABS: 5.0000 mg | ORAL_TABLET | Freq: Once | ORAL | Status: DC | PRN

## 2018-09-20 MED ORDER — ACETAMINOPHEN 10 MG/ML IV SOLN
INTRAVENOUS | Status: DC | PRN
  Administered 2018-09-20: 1000 mg via INTRAVENOUS

## 2018-09-20 MED ORDER — ENOXAPARIN SODIUM 40 MG/0.4ML ~~LOC~~ SOLN
40.0000 mg | SUBCUTANEOUS | Status: DC
  Administered 2018-09-21 – 2018-09-23 (×3): 40 mg via SUBCUTANEOUS
  Filled 2018-09-20 (×3): qty 0.4

## 2018-09-20 MED ORDER — ONDANSETRON HCL 4 MG PO TABS: 4.0000 mg | ORAL_TABLET | Freq: Four times a day (QID) | ORAL | Status: DC | PRN

## 2018-09-20 MED ORDER — SODIUM CHLORIDE 0.9 % IV SOLN
INTRAVENOUS | Status: DC | PRN
  Administered 2018-09-20: 25 ug/min via INTRAVENOUS

## 2018-09-20 MED ORDER — SODIUM CHLORIDE 0.9 % IR SOLN
Status: DC | PRN
  Administered 2018-09-20: 19:00:00 200 mL

## 2018-09-20 MED ORDER — TRAZODONE HCL 50 MG PO TABS: 25.0000 mg | ORAL_TABLET | Freq: Every evening | ORAL | Status: DC | PRN

## 2018-09-20 MED ORDER — FENTANYL CITRATE (PF) 100 MCG/2ML IJ SOLN: 25.0000 ug | INTRAMUSCULAR | Status: DC | PRN

## 2018-09-20 MED ORDER — MENTHOL 3 MG MT LOZG
1.0000 | LOZENGE | OROMUCOSAL | Status: DC | PRN
  Filled 2018-09-20: qty 9

## 2018-09-20 MED ORDER — NICOTINE 21 MG/24HR TD PT24
21.0000 mg | MEDICATED_PATCH | Freq: Every day | TRANSDERMAL | Status: DC
  Administered 2018-09-20 – 2018-09-23 (×4): 21 mg via TRANSDERMAL
  Filled 2018-09-20 (×5): qty 1

## 2018-09-20 MED ORDER — POLYETHYLENE GLYCOL 3350 17 G PO PACK: 17.0000 g | PACK | Freq: Every day | ORAL | Status: DC | PRN

## 2018-09-20 MED ORDER — CLINDAMYCIN PHOSPHATE 600 MG/50ML IV SOLN
600.0000 mg | Freq: Four times a day (QID) | INTRAVENOUS | Status: AC
  Administered 2018-09-20 – 2018-09-21 (×2): 600 mg via INTRAVENOUS
  Filled 2018-09-20 (×2): qty 50

## 2018-09-20 MED ORDER — SUGAMMADEX SODIUM 200 MG/2ML IV SOLN
INTRAVENOUS | Status: AC
  Filled 2018-09-20: qty 2

## 2018-09-20 MED ORDER — SODIUM CHLORIDE 0.9 % IV SOLN
INTRAVENOUS | Status: DC
  Administered 2018-09-20: 11:00:00 via INTRAVENOUS

## 2018-09-20 MED ORDER — FERROUS SULFATE 325 (65 FE) MG PO TABS
325.0000 mg | ORAL_TABLET | Freq: Three times a day (TID) | ORAL | Status: DC
  Administered 2018-09-21 – 2018-09-23 (×5): 325 mg via ORAL
  Filled 2018-09-20 (×5): qty 1

## 2018-09-20 MED ORDER — ACETAMINOPHEN 10 MG/ML IV SOLN
INTRAVENOUS | Status: AC
  Filled 2018-09-20: qty 100

## 2018-09-20 MED ORDER — LIDOCAINE HCL (CARDIAC) PF 100 MG/5ML IV SOSY
PREFILLED_SYRINGE | INTRAVENOUS | Status: DC | PRN
  Administered 2018-09-20: 60 mg via INTRAVENOUS

## 2018-09-20 MED ORDER — ALBUTEROL SULFATE (2.5 MG/3ML) 0.083% IN NEBU: 2.5000 mg | INHALATION_SOLUTION | Freq: Four times a day (QID) | RESPIRATORY_TRACT | Status: DC | PRN

## 2018-09-20 MED ORDER — MAGNESIUM HYDROXIDE 400 MG/5ML PO SUSP: 30.0000 mL | Freq: Every day | ORAL | Status: DC | PRN

## 2018-09-20 MED ORDER — MORPHINE SULFATE (PF) 2 MG/ML IV SOLN
2.0000 mg | INTRAVENOUS | Status: DC | PRN
  Administered 2018-09-20: 2 mg via INTRAVENOUS
  Filled 2018-09-20: qty 1

## 2018-09-20 MED ORDER — DEXMEDETOMIDINE HCL 200 MCG/2ML IV SOLN
INTRAVENOUS | Status: DC | PRN
  Administered 2018-09-20: 8 ug via INTRAVENOUS

## 2018-09-20 MED ORDER — DEXAMETHASONE SODIUM PHOSPHATE 10 MG/ML IJ SOLN
INTRAMUSCULAR | Status: DC | PRN
  Administered 2018-09-20: 5 mg via INTRAVENOUS

## 2018-09-20 MED ORDER — SUGAMMADEX SODIUM 200 MG/2ML IV SOLN
INTRAVENOUS | Status: DC | PRN
  Administered 2018-09-20: 150 mg via INTRAVENOUS

## 2018-09-20 MED ORDER — GENTAMICIN SULFATE 40 MG/ML IJ SOLN
INTRAMUSCULAR | Status: AC
  Filled 2018-09-20: qty 2

## 2018-09-20 MED ORDER — ACETAMINOPHEN 500 MG PO TABS
500.0000 mg | ORAL_TABLET | Freq: Four times a day (QID) | ORAL | Status: AC
  Administered 2018-09-20 – 2018-09-21 (×3): 500 mg via ORAL
  Filled 2018-09-20 (×3): qty 1

## 2018-09-20 MED ORDER — LIDOCAINE HCL (PF) 2 % IJ SOLN
INTRAMUSCULAR | Status: AC
  Filled 2018-09-20: qty 10

## 2018-09-20 MED ORDER — FENTANYL CITRATE (PF) 100 MCG/2ML IJ SOLN
INTRAMUSCULAR | Status: AC
  Filled 2018-09-20: qty 2

## 2018-09-20 MED ORDER — FENTANYL CITRATE (PF) 100 MCG/2ML IJ SOLN
INTRAMUSCULAR | Status: DC | PRN
  Administered 2018-09-20 (×2): 50 ug via INTRAVENOUS

## 2018-09-20 MED ORDER — SEVOFLURANE IN SOLN
RESPIRATORY_TRACT | Status: AC
  Filled 2018-09-20: qty 250

## 2018-09-20 MED ORDER — TRAMADOL HCL 50 MG PO TABS
50.0000 mg | ORAL_TABLET | Freq: Four times a day (QID) | ORAL | Status: DC | PRN
  Administered 2018-09-20: 50 mg via ORAL
  Filled 2018-09-20: qty 1

## 2018-09-20 MED ORDER — HYDROCODONE-ACETAMINOPHEN 5-325 MG PO TABS
1.0000 | ORAL_TABLET | ORAL | Status: DC | PRN
  Administered 2018-09-21: 1 via ORAL
  Filled 2018-09-20: qty 1

## 2018-09-20 MED ORDER — DEXMEDETOMIDINE HCL IN NACL 80 MCG/20ML IV SOLN
INTRAVENOUS | Status: AC
  Filled 2018-09-20: qty 20

## 2018-09-20 MED ORDER — LABETALOL HCL 5 MG/ML IV SOLN: 20.0000 mg | INTRAVENOUS | Status: DC | PRN

## 2018-09-20 MED ORDER — DONEPEZIL HCL 5 MG PO TABS
5.0000 mg | ORAL_TABLET | Freq: Every day | ORAL | Status: DC
  Administered 2018-09-20 – 2018-09-22 (×3): 5 mg via ORAL
  Filled 2018-09-20 (×3): qty 1

## 2018-09-20 MED ORDER — PHENYLEPHRINE HCL (PRESSORS) 10 MG/ML IV SOLN
INTRAVENOUS | Status: DC | PRN
  Administered 2018-09-20: 200 ug via INTRAVENOUS
  Administered 2018-09-20: 100 ug via INTRAVENOUS

## 2018-09-20 MED ORDER — ALUM & MAG HYDROXIDE-SIMETH 200-200-20 MG/5ML PO SUSP: 30.0000 mL | ORAL | Status: DC | PRN

## 2018-09-20 MED ORDER — ONDANSETRON HCL 4 MG/2ML IJ SOLN
4.0000 mg | Freq: Once | INTRAMUSCULAR | Status: AC
  Administered 2018-09-20: 4 mg via INTRAVENOUS
  Filled 2018-09-20: qty 2

## 2018-09-20 MED ORDER — ONDANSETRON HCL 4 MG/2ML IJ SOLN
INTRAMUSCULAR | Status: AC
  Filled 2018-09-20: qty 2

## 2018-09-20 MED ORDER — ACETAMINOPHEN 650 MG RE SUPP: 650.0000 mg | Freq: Four times a day (QID) | RECTAL | Status: DC | PRN

## 2018-09-20 MED ORDER — PHENOL 1.4 % MT LIQD
1.0000 | OROMUCOSAL | Status: DC | PRN
  Filled 2018-09-20: qty 177

## 2018-09-20 MED ORDER — CLINDAMYCIN PHOSPHATE 600 MG/50ML IV SOLN
INTRAVENOUS | Status: DC | PRN
  Administered 2018-09-20: 600 mg via INTRAVENOUS

## 2018-09-20 MED ORDER — ACETAMINOPHEN 325 MG PO TABS
325.0000 mg | ORAL_TABLET | Freq: Four times a day (QID) | ORAL | Status: DC | PRN
  Administered 2018-09-22: 650 mg via ORAL
  Filled 2018-09-20: qty 2

## 2018-09-20 MED ORDER — BISACODYL 10 MG RE SUPP
10.0000 mg | Freq: Every day | RECTAL | Status: DC | PRN
  Administered 2018-09-23: 10 mg via RECTAL
  Filled 2018-09-20: qty 1

## 2018-09-20 MED ORDER — DEXAMETHASONE SODIUM PHOSPHATE 10 MG/ML IJ SOLN
INTRAMUSCULAR | Status: AC
  Filled 2018-09-20: qty 1

## 2018-09-20 MED ORDER — LACTATED RINGERS IV SOLN
INTRAVENOUS | Status: DC | PRN
  Administered 2018-09-20: 17:00:00 via INTRAVENOUS

## 2018-09-20 MED ORDER — PHENYLEPHRINE HCL (PRESSORS) 10 MG/ML IV SOLN
INTRAVENOUS | Status: AC
  Filled 2018-09-20: qty 1

## 2018-09-20 MED ORDER — OXYCODONE HCL 5 MG/5ML PO SOLN: 5.0000 mg | Freq: Once | ORAL | Status: DC | PRN

## 2018-09-20 MED ORDER — CLINDAMYCIN PHOSPHATE 600 MG/50ML IV SOLN
INTRAVENOUS | Status: AC
  Filled 2018-09-20: qty 50

## 2018-09-20 MED ORDER — FENTANYL CITRATE (PF) 100 MCG/2ML IJ SOLN
50.0000 ug | Freq: Once | INTRAMUSCULAR | Status: AC
  Administered 2018-09-20: 50 ug via INTRAVENOUS
  Filled 2018-09-20: qty 2

## 2018-09-20 SURGICAL SUPPLY — 45 items
BIT DRILL 4.3MMS DISTAL GRDTED (BIT) ×1 IMPLANT
BNDG COHESIVE 6X5 TAN STRL LF (GAUZE/BANDAGES/DRESSINGS) ×4 IMPLANT
CANISTER SUCT 1200ML W/VALVE (MISCELLANEOUS) ×2 IMPLANT
COVER WAND RF STERILE (DRAPES) ×2 IMPLANT
CRADLE LAMINECT ARM (MISCELLANEOUS) ×2 IMPLANT
DRAPE SHEET LG 3/4 BI-LAMINATE (DRAPES) ×4 IMPLANT
DRAPE SURG 17X11 SM STRL (DRAPES) ×4 IMPLANT
DRAPE U-SHAPE 47X51 STRL (DRAPES) ×2 IMPLANT
DRILL 4.3MMS DISTAL GRADUATED (BIT) ×2
DRSG OPSITE POSTOP 3X4 (GAUZE/BANDAGES/DRESSINGS) ×4 IMPLANT
DRSG OPSITE POSTOP 4X14 (GAUZE/BANDAGES/DRESSINGS) IMPLANT
DRSG OPSITE POSTOP 4X6 (GAUZE/BANDAGES/DRESSINGS) ×2 IMPLANT
DURAPREP 26ML APPLICATOR (WOUND CARE) ×4 IMPLANT
ELECT REM PT RETURN 9FT ADLT (ELECTROSURGICAL) ×2
ELECTRODE REM PT RTRN 9FT ADLT (ELECTROSURGICAL) ×1 IMPLANT
GLOVE BIOGEL PI IND STRL 9 (GLOVE) ×1 IMPLANT
GLOVE BIOGEL PI INDICATOR 9 (GLOVE) ×1
GLOVE SURG 9.0 ORTHO LTXF (GLOVE) ×4 IMPLANT
GOWN STRL REUS TWL 2XL XL LVL4 (GOWN DISPOSABLE) ×2 IMPLANT
GOWN STRL REUS W/ TWL LRG LVL3 (GOWN DISPOSABLE) ×1 IMPLANT
GOWN STRL REUS W/TWL LRG LVL3 (GOWN DISPOSABLE) ×1
GUIDEPIN VERSANAIL DSP 3.2X444 (ORTHOPEDIC DISPOSABLE SUPPLIES) ×2 IMPLANT
GUIDEWIRE BALL NOSE 80CM (WIRE) ×2 IMPLANT
HEMOVAC 400CC 10FR (MISCELLANEOUS) IMPLANT
HIP FRA NAIL LAG SCREW 10.5X90 (Orthopedic Implant) ×2 IMPLANT
HIP FRAC NAIL LEFT 11X360MM (Orthopedic Implant) ×2 IMPLANT
KIT TURNOVER CYSTO (KITS) ×2 IMPLANT
MAT ABSORB  FLUID 56X50 GRAY (MISCELLANEOUS) ×1
MAT ABSORB FLUID 56X50 GRAY (MISCELLANEOUS) ×1 IMPLANT
NAIL HIP FRAC LEFT 11X360MM (Orthopedic Implant) ×1 IMPLANT
NS IRRIG 1000ML POUR BTL (IV SOLUTION) ×2 IMPLANT
PACK HIP COMPR (MISCELLANEOUS) ×2 IMPLANT
SCREW BONE CORTICAL 5.0X42 (Screw) ×2 IMPLANT
SCREW BONE CORTICAL 5.0X44 (Screw) ×2 IMPLANT
SCREW LAG HIP FRA NAIL 10.5X90 (Orthopedic Implant) ×1 IMPLANT
SCREWDRIVER HEX TIP 3.5MM (MISCELLANEOUS) ×2 IMPLANT
SLEEVE MEASURING DRILL 4.3MM (BIT) ×2 IMPLANT
STAPLER SKIN PROX 35W (STAPLE) ×2 IMPLANT
SUCTION FRAZIER HANDLE 10FR (MISCELLANEOUS) ×1
SUCTION TUBE FRAZIER 10FR DISP (MISCELLANEOUS) ×1 IMPLANT
SUT VIC AB 0 CT1 36 (SUTURE) ×4 IMPLANT
SUT VIC AB 2-0 CT1 27 (SUTURE) ×1
SUT VIC AB 2-0 CT1 TAPERPNT 27 (SUTURE) ×1 IMPLANT
SUT VICRYL 0 AB UR-6 (SUTURE) ×2 IMPLANT
SYR 30ML LL (SYRINGE) ×2 IMPLANT

## 2018-09-20 NOTE — ED Notes (Signed)
Pt urinated in her bed while trying to place her in a diaper. Pt cried out in pain and stated "leave me alone, my hip hurts, please dont hurt me". Dr Alfred Levins notified and requested pain medication.

## 2018-09-20 NOTE — H&P (Addendum)
[facility]   PATIENT NAME: Kaylee Robinson    MR#:  [id number]  DATE OF BIRTH:  01/13/1946  DATE OF ADMISSION:  09/20/2018  PRIMARY CARE PHYSICIAN: Leotis ShamesSingh, Jasmine, MD   REQUESTING/REFERRING PHYSICIAN: Cecil CobbsVeronese, Caroline, MD  CHIEF COMPLAINT:   Chief Complaint  Patient presents with  . Fall  . Fever    HISTORY OF PRESENT ILLNESS:  Kaylee KaysLavern Osier  is a 73 y.o. Caucasian female with a known history of bilateral knee osteoarthritis, who presented to the emergency room with a concern of left hip pain after having an accidental mechanical fall.  She apparently lost her balance and fell in her garden.  She denied any presyncope or syncope.  No chest pain or headache or paresthesias or focal muscle weakness.  No recent fever or chills.  No cough or wheezing or shortness of breath.  No recent sick exposures.  Upon presentation to the emergency room, blood pressure was elevated at 170/77 with otherwise normal vital signs.  CBC showed WBC of 10.8 with neutrophils of 73%.  CMP was remarkable for minimal hyponatremia of 133.  EKG showed normal sinus rhythm with a rate of 72 with right atrial enlargement, T wave inversion inferolaterally and prolonged QT interval with QTC of 515 MS.  Portable chest x-ray showed no acute cardiopulmonary disease.  Pelvic and femoral x-ray showed left femoral acute comminuted intertrochanteric fracture with mild displacement.  The patient was given 50 mcg of IV fentanyl as well as 4 mg of IV Zofran.  She will be admitted to the surgical bed for further evaluation and management. PAST MEDICAL HISTORY:   Past Medical History:  Diagnosis Date  . Arthritis    bilateral knees  Alzheimer's dementia, hypertension, GERD, mild COPD and varicose veins  PAST SURGICAL HISTORY:   Past Surgical History:  Procedure Laterality Date  . TONSILLECTOMY     adnoidectomy    SOCIAL HISTORY:   Social History   Tobacco Use  . Smoking  status: Current Some Day Smoker    Packs/day: 1.00    Types: Cigarettes  . Smokeless tobacco: Never Used  Substance Use Topics  . Alcohol use: No    FAMILY HISTORY:   Family History  Problem Relation Age of Onset  . Breast cancer Neg Hx     DRUG ALLERGIES:   Allergies  Allergen Reactions  . Enalapril Other (See Comments)  . Naproxen Swelling  . Amoxicillin Rash  . Sulfa Antibiotics Rash    REVIEW OF SYSTEMS:   ROS As per history of present illness. All pertinent systems were reviewed above. Constitutional,  HEENT, cardiovascular, respiratory, GI, GU, musculoskeletal, neuro, psychiatric, endocrine,  integumentary and hematologic systems were reviewed and are otherwise  negative/unremarkable except for positive findings mentioned above in the HPI.   MEDICATIONS AT HOME:   Prior to Admission medications   Medication Sig Start Date End Date Taking? Authorizing Provider  acetaminophen (TYLENOL) 500 MG tablet Take 500 mg by mouth every 6 (six) hours as needed.    Yes [provider]  albuterol (PROVENTIL HFA;VENTOLIN HFA) 108 (90 Base) MCG/ACT inhaler Inhale 2 puffs into the lungs every 6 (six) hours as needed for wheezing or shortness of breath. 09/11/15  Yes Phineas SemenGoodman, Graydon, MD  cetirizine (ZYRTEC) 10 MG tablet Take 10 mg by mouth daily. 09/05/18  Yes [provider]  donepezil (ARICEPT) 5 MG tablet Take 5 mg by mouth at bedtime.   Yes [provider]  fluticasone (  FLONASE) 50 MCG/ACT nasal spray USE 2 SPRAY(S) IN EACH NOSTRIL ONCE DAILY 09/05/18  Yes [provider]  memantine (NAMENDA) 5 MG tablet Take 5 mg by mouth 2 (two) times daily. 08/07/18  Yes [provider]  sertraline (ZOLOFT) 50 MG tablet Take 50 mg by mouth daily. 09/12/18  Yes [provider]  traMADol (ULTRAM) 50 MG tablet Take 50 mg by mouth every 12 (twelve) hours as needed.    Yes [provider]      VITAL SIGNS:  Blood pressure (!) 165/78,  pulse 73, temperature 98.7 F (37.1 C), temperature source Oral, resp. rate (!) 22, weight 58.6 kg, SpO2 99 %.  PHYSICAL EXAMINATION:  Physical Exam  GENERAL:  73 y.o.-year-old Caucasian female patient lying in the bed with no acute distress.  EYES: Pupils equal, round, reactive to light and accommodation. No scleral icterus. Extraocular muscles intact.  HEENT: Head atraumatic, normocephalic. Oropharynx and nasopharynx clear.  NECK:  Supple, no jugular venous distention. No thyroid enlargement, no tenderness.  LUNGS: Normal breath sounds bilaterally, no wheezing, rales,rhonchi or crepitation. No use of accessory muscles of respiration.  CARDIOVASCULAR: Regular rate and rhythm, S1, S2 normal. No murmurs, rubs, or gallops.  ABDOMEN: Soft, nondistended, nontender. Bowel sounds present. No organomegaly or mass.  EXTREMITIES: No pedal edema, cyanosis, or clubbing.  NEUROLOGIC: Cranial nerves II through XII are intact. Muscle strength 5/5 in all extremities. Sensation intact. Gait not checked. Musculoskeletal: Left hip tenderness. PSYCHIATRIC: The patient is alert and oriented x 3.  Normal affect and good eye contact. SKIN: No obvious rash, lesion, or ulcer.   LABORATORY PANEL:   CBC Recent Labs  Lab 09/19/18 2206  WBC 10.8*  HGB 13.3  HCT 40.6  PLT 253   ------------------------------------------------------------------------------------------------------------------  Chemistries  Recent Labs  Lab 09/19/18 2206  NA 133*  K 3.9  CL 99  CO2 23  GLUCOSE 101*  BUN 12  CREATININE 0.80  CALCIUM 8.6*  AST 19  ALT 12  ALKPHOS 103  BILITOT 0.9   ------------------------------------------------------------------------------------------------------------------  Cardiac Enzymes No results for input(s): TROPONINI in the last 168 hours. ------------------------------------------------------------------------------------------------------------------  RADIOLOGY:  Dg Chest 1  View  Result Date: 09/19/2018 CLINICAL DATA:  Hip fracture EXAM: CHEST  1 VIEW COMPARISON:  04/24/2018 FINDINGS: Mild hyperinflation. No acute opacity or pleural effusion. Normal cardiomediastinal silhouette with aortic atherosclerosis. No pneumothorax. IMPRESSION: No active disease. Electronically Signed   By: Jasmine PangKim  Fujinaga M.D.   On: 09/19/2018 22:52   Dg Pelvis 1-2 Views  Result Date: 09/19/2018 CLINICAL DATA:  Fall with hip pain EXAM: PELVIS - 1-2 VIEW COMPARISON:  09/19/2018 FINDINGS: Acute comminuted left intertrochanteric fracture. The left femoral head projects in joint. Pubic symphysis and rami are intact. Normal alignment right hip. IMPRESSION: Acute comminuted left intertrochanteric fracture Electronically Signed   By: Jasmine PangKim  Fujinaga M.D.   On: 09/19/2018 22:51   Dg Femur Min 2 Views Left  Result Date: 09/19/2018 CLINICAL DATA:  73 year old female status post fall with hip pain. EXAM: LEFT FEMUR 2 VIEWS COMPARISON:  Pelvis radiograph today reported separately. FINDINGS: Comminuted left femur intertrochanteric fracture with mild displacement of fracture fragments. The left femoral head remains normally located. No superimposed fracture of the visible left hemipelvis. The left femoral shaft is intact. Preserved alignment at the left knee. No knee joint effusion identified. Distal femur appears intact. IMPRESSION: Acute comminuted left femur intertrochanteric fracture with mild displacement. Electronically Signed   By: Odessa FlemingH  Hall M.D.   On: 09/19/2018 22:51  IMPRESSION AND PLAN:   1.  Left hip fracture secondary to mechanical fall.  The patient will be admitted to a surgical bed.  Pain management will be provided.  An orthopedic consultation will be obtained by Dr. Mack Guise who was notified about the patient.  Keep her n.p.o. after midnight.  She has no history of CVA, CHF, coronary artery disease, diabetes on insulin or renal failure.  She is considered at average risk for her age for  perioperative cardiovascular events per the revised cardiac risk index.  She has no current pulmonary issues.  2.  Hypertensive urgency.  This is likely secondary to left hip pain.  Pain management will be provided.  She will be placed on PRN IV labetalol.  3.  Alzheimer's dementia.  Her Aricept and Namenda will be continued.  4.  Depression.  Zoloft will be continued.  We will hold off her trazodone given her prolonged QT interval.  5.  DVT prophylaxis.  SCDs for now.  Medical prophylaxis is currently delayed pending operative procedure.   All the records are reviewed and case discussed with ED provider. The plan of care was discussed in details with the patient (and family). I answered all questions. The patient agreed to proceed with the above mentioned plan. Further management will depend upon hospital course.   CODE STATUS: Full code  TOTAL TIME TAKING CARE OF THIS PATIENT: 55 minutes.    Christel Mormon M.D on 09/20/2018 at 12:22 AM  Pager - [phone]  After 6pm go to [email or web address] - Proofreader  [facility]  Office  [phone]  CC: Primary care physician; Glendon Axe, MD   Note: This dictation was prepared with Dragon dictation along with smaller phrase technology. Any transcriptional errors that result from this process are unintentional.

## 2018-09-20 NOTE — Op Note (Signed)
09/20/2018  7:26 PM  PATIENT:  Kaylee Robinson    PRE-OPERATIVE DIAGNOSIS:  Left intertrochanteric hip fracture  POST-OPERATIVE DIAGNOSIS:  Same  PROCEDURE:  INTRAMEDULLARY FIXATION OF LEFT INTERTROCHANTERIC HIP FRACTURE  SURGEON:  Juanell FairlyKevin Karolina Zamor, MD  ANESTHESIA:   General  EBL:  100 cc  IMPLANT:  ZIMMER BIOMET AFFIXUS NAIL 360mm x 11mm with a 90mm lag screw and distal interlocking screws 44mm and 42 mm in length.  PREOPERATIVE INDICATIONS:  Kaylee Robinson is a  73 y.o. female with a diagnosis of left intertrochanteric hip fracture.  I recommended intramedullary fixation for this fracture.  Patient has a history of Alzheimer's dementia and I reviewed the risks and benefits of surgery with her son, Casimiro NeedleMichael.  The risks include but are not limited to infection, bleeding requiring blood transfusion, nerve or blood vessel injury, malunion, nonunion, hardware prominence, hardware failure, leg length discrepancy or change in lower extremity rotation and need for further surgery including hardware removal with conversion to a total hip arthroplasty. Medical risks include but are not limited to DVT and pulmonary embolism, myocardial infarction, stroke, pneumonia, respiratory failure and death. The patient's son understood these risks and wished to proceed with surgery.  OPERATIVE PROCEDURE:  The patient was brought to the operating room and placed in the supine position on the fracture table. The patient received general anesthesia.  A closed reduction was performed under C-arm guidance.  The fracture reduction was confirmed on both AP and lateral views. After adequate reduction was achieved, a time out was performed to verify the patient's name, date of birth, medical record number, correct site of surgery correct procedure to be performed. The timeout was also used to verify the patient received antibiotics and all appropriate instruments, implants and radiographic studies were available in the room.  Once all in attendance were in agreement, the case began. The patient was prepped and draped in a sterile fashion. She received preoperative antibiotics with clindamycin given her amoxicillin allergy.  An incision was made proximal to the greater trochanter in line with the femur. A guidewire was placed over the tip of the greater trochanter and advanced by drill into the proximal femur to the level of the lesser trochanter.  Confirmation of the drill pin position was made on AP and lateral C-arm images.  The threaded guidepin was then overdrilled with the proximal femoral entry reamer.  A ball-tipped guidewire was then advanced down the intramedullary canal, across the fracture, and down the femoral shaft to the knee.  The ball tip guidewire's position was confirmed at both the knee and hip via C-arm imaging. A depth gauge was used to measure the length of the long nail to be used. It was measured to be 360 mm. The actual nail was then inserted into the proximal femur, across the fracture site and down the femoral shaft. Its position was confirmed on AP and lateral C-arm images.  The ball tip guidewire was removed.  Once the nail was completely seated, the drill guide for the lag screw was placed through the guide arm for the Affixus nail. A guidepin was then placed through this drill guide and advanced through the lateral cortex of the femur, across the fracture site and into the femoral head achieving a tip apex distance of less than 25 mm. The length of the drill pin was measured to be 90mm, and then the drill for the lag screw was advanced through the lateral cortex, across the fracture site and up into the femoral  head to the depth of the lag screw..  The lag screw was then advanced by hand into position across the fracture site into the femoral head. Its final position was confirmed on AP and lateral C-arm images. Compression was applied as traction was carefully released. The set screw in the top of the  intramedullary rod was tightened by hand using a screwdriver. It was backed off a quarter turn to allow for compression at the fracture site.  The attention was then turned to placement of the distal interlocking screws. A perfect circle technique was used. 2 small stab incisions were made over the distal interlocking screw holes.  A free hand technique was used to drill both distal interlocking screws. The depth of the screw holes was measured with a depth gauge. The 39mm and 23mm screws were then advanced into position and tightened by hand. Final C-arm images of the entire intramedullary construct were taken in both the AP and lateral planes.   The wounds were irrigated copiously and closed with 0 Vicryl for closure of the deep fascia and 2-0 Vicryl for subcutaneous closure. The skin was approximated with staples. A dry sterile dressing was applied. I was scrubbed and present the entire case and all sharp, sponge and instrument counts were correct at the conclusion of the case. Patient was transferred to a hospital bed and brought to PACU in stable condition.     Timoteo Gaul, MD

## 2018-09-20 NOTE — NC FL2 (Signed)
[place] LEVEL OF CARE SCREENING TOOL     IDENTIFICATION  Patient Name: Kaylee Robinson Birthdate: [birth year] Sex: female Admission Date (Current Location): 09/19/2018  [place] and [place] Number:  [name] [id number] [place] and Address:  [facility], [address] [place], [place] [address]      Provider Number: [id number]  Attending Physician Name and Address:  Gladstone Lighter, MD  Relative Name and Phone Number:       Current Level of Care: Hospital Recommended Level of Care: [place] Prior Approval Number:    Date Approved/Denied:   PASRR Number: [id number]  Discharge Plan: SNF    Current Diagnoses: Patient Active Problem List   Diagnosis Date Noted  . Closed left hip fracture ([place]) 09/20/2018    Orientation RESPIRATION BLADDER Height & Weight     Self, Place  O2(3 Liters Oxygen.) Incontinent Weight: 129 lb 3 oz (58.6 kg) Height:     BEHAVIORAL SYMPTOMS/MOOD NEUROLOGICAL BOWEL NUTRITION STATUS      Continent Diet(Diet: NPO for surgery to be advanced.)  AMBULATORY STATUS COMMUNICATION OF NEEDS Skin   Extensive Assist Verbally Surgical wounds                       Personal Care Assistance Level of Assistance  Bathing, Feeding, Dressing Bathing Assistance: Limited assistance Feeding assistance: Independent Dressing Assistance: Limited assistance     Functional Limitations Info  Sight, Hearing, Speech Sight Info: Impaired Hearing Info: Adequate Speech Info: Adequate    SPECIAL CARE FACTORS FREQUENCY  PT (By licensed PT), OT (By licensed OT)     PT Frequency: 5 OT Frequency: 5            Contractures      Additional Factors Info  Code Status, Allergies Code Status Info: Full Code. Allergies Info: Enalapril, Naproxen, Amoxicillin, Sulfa Antibiotics           Current Medications (09/20/2018):  This is the current hospital active medication list Current Facility-Administered  Medications  Medication Dose Route Frequency Provider Last Rate Last Dose  . 0.9 %  sodium chloride infusion   Intravenous Continuous Mansy, Jan A, MD 100 mL/hr at 09/20/18 1129    . acetaminophen (TYLENOL) tablet 650 mg  650 mg Oral Q6H PRN Mansy, Jan A, MD       Or  . acetaminophen (TYLENOL) suppository 650 mg  650 mg Rectal Q6H PRN Mansy, Jan A, MD      . albuterol (PROVENTIL) (2.5 MG/3ML) 0.083% nebulizer solution 2.5 mg  2.5 mg Inhalation Q6H PRN Mansy, Jan A, MD      . donepezil (ARICEPT) tablet 5 mg  5 mg Oral QHS Mansy, Jan A, MD      . fluticasone [facility]) 50 MCG/ACT nasal spray 2 spray  2 spray Each Nare Daily Mansy, Jan A, MD      . labetalol (NORMODYNE) injection 20 mg  20 mg Intravenous Q3H PRN Mansy, Jan A, MD      . loratadine (CLARITIN) tablet 10 mg  10 mg Oral Daily Mansy, Jan A, MD   10 mg at 09/20/18 0915  . magnesium hydroxide (MILK OF MAGNESIA) suspension 30 mL  30 mL Oral Daily PRN Mansy, Jan A, MD      . memantine [facility]) tablet 5 mg  5 mg Oral BID Mansy, Jan A, MD   5 mg at 09/20/18 0915  . morphine 2 MG/ML injection 2 mg  2 mg Intravenous Q4H PRN  Mansy, Jan A, MD   2 mg at 09/20/18 1132  . nicotine (NICODERM CQ - dosed in mg/24 hours) patch 21 mg  21 mg Transdermal Daily Enid BaasKalisetti, Radhika, MD   21 mg at 09/20/18 0914  . ondansetron (ZOFRAN) tablet 4 mg  4 mg Oral Q6H PRN Mansy, Jan A, MD      . sertraline (ZOLOFT) tablet 50 mg  50 mg Oral Daily Mansy, Jan A, MD   50 mg at 09/20/18 0915  . traMADol (ULTRAM) tablet 50 mg  50 mg Oral Q6H PRN Mansy, Jan A, MD   50 mg at 09/20/18 1119     Discharge Medications: Please see discharge summary for a list of discharge medications.  Relevant Imaging Results:  Relevant Lab Results:   Additional Information SSN: [id number]  Adenike Shidler, Kaylee Robinson M, [place]

## 2018-09-20 NOTE — Anesthesia Preprocedure Evaluation (Addendum)
Anesthesia Evaluation  Patient identified by MRN, date of birth, ID band Patient awake    Reviewed: Allergy & Precautions, H&P , NPO status , Patient's Chart, lab work & pertinent test results  Airway Mallampati: III  TM Distance: >3 FB Neck ROM: full    Dental  (+) Upper Dentures, Lower Dentures   Pulmonary neg COPD, Current Smoker,           Cardiovascular (-) Past MI negative cardio ROS       Neuro/Psych neg Seizures PSYCHIATRIC DISORDERS Dementia negative neurological ROS     GI/Hepatic negative GI ROS, Neg liver ROS,   Endo/Other  negative endocrine ROS  Renal/GU      Musculoskeletal  (+) Arthritis ,   Abdominal   Peds  Hematology negative hematology ROS (+)   Anesthesia Other Findings Past Medical History: No date: Arthritis     Comment:  bilateral knees  Past Surgical History: No date: TONSILLECTOMY     Comment:  adnoidectomy  BMI    Body Mass Index: 21.50 kg/m      Reproductive/Obstetrics negative OB ROS                            Anesthesia Physical Anesthesia Plan  ASA: II  Anesthesia Plan: General ETT   Post-op Pain Management:    Induction:   PONV Risk Score and Plan: Ondansetron, Dexamethasone and Treatment may vary due to age or medical condition  Airway Management Planned: Simple Face Mask and Natural Airway  Additional Equipment:   Intra-op Plan:   Post-operative Plan:   Informed Consent: I have reviewed the patients History and Physical, chart, labs and discussed the procedure including the risks, benefits and alternatives for the proposed anesthesia with the patient or authorized representative who has indicated his/her understanding and acceptance.     Dental Advisory Given  Plan Discussed with: Anesthesiologist and CRNA  Anesthesia Plan Comments:        Anesthesia Quick Evaluation

## 2018-09-20 NOTE — TOC Initial Note (Signed)
Transition of Care [facility]) - Initial/Assessment Note    Patient Details  Name: Barrie Sigmund MRN: [id number] Date of Birth: May 13, 1945  [facility] CM/SW Contact:    Halea Lieb, Lenice Llamas Phone Number: [phone]  09/20/2018, 3:07 PM  Clinical Narrative: Patient is having surgery for a hip fracture. Clinical Education officer, museum (CSW) met with patient alone at bedside prior to surgery today. Patient reported that she lives with her husband Ronalee Belts and prefers to go home after surgery. CSW contacted patient's son Eddie Dibbles to complete assessment. Per Eddie Dibbles patient, patient's husband Ronalee Belts and Paul's wife all live together in the same house. Per Eddie Dibbles his wife stays home with patient 24/7. CSW explained to [place] that after surgery PT will evaluate patient and make a recommendation of home health or SNF. Son is agreeable to SNF placement if needed however he prefers for patient to come home. FL2 complete and faxed out. CSW will continue to follow and assist as needed.                     Expected Discharge Plan: [place] Barriers to Discharge: Continued Medical Work up   Patient Goals and CMS Choice Patient states their goals for this hospitalization and ongoing recovery are:: Pain control      Expected Discharge Plan and Services Expected Discharge Plan: [place] In-house Referral: Clinical Social Work Discharge Planning Services: CM Consult   Living arrangements for the past 2 months: [place]                                      Prior Living Arrangements/Services Living arrangements for the past 2 months: Single Family Home Lives with:: Adult Children, Spouse Patient language and need for interpreter reviewed:: No Do you feel safe going back to the place where you live?: Yes      Need for Family Participation in Patient Care: Yes (Comment) Care giver support system in place?: Yes (comment)   Criminal Activity/Legal  Involvement Pertinent to Current Situation/Hospitalization: No - Comment as needed  Activities of Daily Living   ADL Screening (condition at time of admission) Patient's cognitive ability adequate to safely complete daily activities?: No Does the patient have difficulty concentrating, remembering, or making decisions?: Yes  Permission Sought/Granted Permission sought to share information with : Chartered certified accountant granted to share information with : Yes, Verbal Permission Granted              Emotional Assessment Appearance:: Appears stated age   Affect (typically observed): Calm Orientation: : Oriented to Self, Oriented to Place, Fluctuating Orientation (Suspected and/or reported Sundowners) Alcohol / Substance Use: Not Applicable Psych Involvement: No (comment)  Admission diagnosis:  Fall [W19.XXXA] Closed fracture of left hip, initial encounter ([place]) [S72.002A] Fall, initial encounter [W19.XXXA] Fever, unspecified fever cause [R50.9] Patient Active Problem List   Diagnosis Date Noted  . Closed left hip fracture ([place]) 09/20/2018   PCP:  Glendon Axe, MD Pharmacy:  No Pharmacies Listed    Social Determinants of Health (SDOH) Interventions    Readmission Risk Interventions No flowsheet data found.

## 2018-09-20 NOTE — ED Notes (Signed)
ED TO INPATIENT HANDOFF REPORT  ED Nurse Name and Phone #:  Lynsie Mcwatters [phone]  S Name/Age/Gender Kaylee Robinson 73 y.o. female Room/Bed: ED08A/ED08A  Code Status   Code Status: Full Code  Home/SNF/Other Home Patient oriented to: self Is this baseline? No   Triage Complete: Triage complete  Chief Complaint Fall/AMS  Triage Note Pt presents via EMS s/p fall with left hip pain. EMS reports shortening of left leg. Febrile per EMS. Possible UTI per EMS. Pt denies urinary symptoms.    Allergies Allergies  Allergen Reactions  . Enalapril Other (See Comments)  . Naproxen Swelling  . Amoxicillin Rash  . Sulfa Antibiotics Rash    Level of Care/Admitting Diagnosis ED Disposition    ED Disposition Condition Comment   Admit  Hospital Area: [facility] [[id number]]  Level of Care: Med-Surg [16]  Covid Evaluation: Confirmed COVID Negative  Diagnosis: Closed left hip fracture [facility](HCC) [[id number]][[id number]]  Admitting Physician: Hannah BeatMANSY, JAN A [[id number]]  Attending Physician: Hannah BeatMANSY, JAN A [[id number]]  Estimated length of stay: 3 - 4 days  Certification:: I certify this patient will need inpatient services for at least 2 midnights  PT Class (Do Not Modify): Inpatient [101]  PT Acc Code (Do Not Modify): Private [1]       B Medical/Surgery History Past Medical History:  Diagnosis Date  . Arthritis    bilateral knees   Past Surgical History:  Procedure Laterality Date  . TONSILLECTOMY     adnoidectomy     A IV Location/Drains/Wounds Patient Lines/Drains/Airways Status   Active Line/Drains/Airways    Name:   Placement date:   Placement time:   Site:   Days:   Peripheral IV 09/19/18 Right Wrist   09/19/18    -    Wrist   1   Peripheral IV 09/19/18 Left Forearm   09/19/18    2318    Forearm   1   External Urinary Catheter   09/20/18    0027    -   less than 1          Intake/Output Last 24 hours No intake or output data in the 24 hours ending 09/20/18  0308  Labs/Imaging Results for orders placed or performed during the hospital encounter of 09/19/18 (from the past 48 hour(s))  CBC WITH DIFFERENTIAL     Status: Abnormal   Collection Time: 09/19/18 10:06 PM  Result Value Ref Range   WBC 10.8 (H) 4.0 - 10.5 K/uL   RBC 4.49 3.87 - 5.11 MIL/uL   Hemoglobin 13.3 12.0 - 15.0 g/dL   HCT 13.040.6 86.536.0 - 78.446.0 %   MCV 90.4 80.0 - 100.0 fL   MCH 29.6 26.0 - 34.0 pg   MCHC 32.8 30.0 - 36.0 g/dL   RDW 69.612.3 29.511.5 - 28.415.5 %   Platelets 253 150 - 400 K/uL   nRBC 0.0 0.0 - 0.2 %   Neutrophils Relative % 73 %   Neutro Abs 8.0 (H) 1.7 - 7.7 K/uL   Lymphocytes Relative 15 %   Lymphs Abs 1.6 0.7 - 4.0 K/uL   Monocytes Relative 10 %   Monocytes Absolute 1.0 0.1 - 1.0 K/uL   Eosinophils Relative 0 %   Eosinophils Absolute 0.0 0.0 - 0.5 K/uL   Basophils Relative 1 %   Basophils Absolute 0.1 0.0 - 0.1 K/uL   Immature Granulocytes 1 %   Abs Immature Granulocytes 0.06 0.00 - 0.07 K/uL    Comment: Performed at [facility], [address] [place], [place] [address]  Protime-INR     Status: None   Collection Time: 09/19/18 10:06 PM  Result Value Ref Range   Prothrombin Time 14.3 11.4 - 15.2 seconds   INR 1.1 0.8 - 1.2    Comment: (NOTE) INR goal varies based on device and disease states. Performed at [facility], [address] [place], [place] [address]   Comprehensive metabolic panel     Status: Abnormal   Collection Time: 09/19/18 10:06 PM  Result Value Ref Range   Sodium 133 (L) 135 - 145 mmol/L   Potassium 3.9 3.5 - 5.1 mmol/L   Chloride 99 98 - 111 mmol/L   CO2 23 22 - 32 mmol/L   Glucose, Bld 101 (H) 70 - 99 mg/dL   BUN 12 8 - 23 mg/dL   Creatinine, Ser 0.80 0.44 - 1.00 mg/dL   Calcium 8.6 (L) 8.9 - 10.3 mg/dL   Total Protein 6.6 6.5 - 8.1 g/dL   Albumin 3.7 3.5 - 5.0 g/dL   AST 19 15 - 41 U/L   ALT 12 0 - 44 U/L   Alkaline Phosphatase 103 38 - 126 U/L   Total Bilirubin 0.9 0.3 - 1.2 mg/dL   GFR calc non Af Amer  >60 >60 mL/min   GFR calc Af Amer >60 >60 mL/min   Anion gap 11 5 - 15    Comment: Performed at [facility], [address] [place], [place] [address]  APTT     Status: None   Collection Time: 09/19/18 10:06 PM  Result Value Ref Range   aPTT 31 24 - 36 seconds    Comment: Performed at [facility], [address], [place],  [address]  SARS Coronavirus 2 (CEPHEID - Performed in [place] hospital lab), Hosp Order     Status: None   Collection Time: 09/19/18 10:06 PM   Specimen: Nasopharyngeal Swab  Result Value Ref Range   SARS Coronavirus 2 NEGATIVE NEGATIVE    Comment: (NOTE) If result is NEGATIVE SARS-CoV-2 target nucleic acids are NOT DETECTED. The SARS-CoV-2 RNA is generally detectable in upper and lower  respiratory specimens during the acute phase of infection. The lowest  concentration of SARS-CoV-2 viral copies this assay can detect is 250  copies / mL. A negative result does not preclude SARS-CoV-2 infection  and should not be used as the sole basis for treatment or other  patient management decisions.  A negative result may occur with  improper specimen collection / handling, submission of specimen other  than nasopharyngeal swab, presence of viral mutation(s) within the  areas targeted by this assay, and inadequate number of viral copies  (<250 copies / mL). A negative result must be combined with clinical  observations, patient history, and epidemiological information. If result is POSITIVE SARS-CoV-2 target nucleic acids are DETECTED. The SARS-CoV-2 RNA is generally detectable in upper and lower  respiratory specimens dur ing the acute phase of infection.  Positive  results are indicative of active infection with SARS-CoV-2.  Clinical  correlation with patient history and other diagnostic information is  necessary to determine patient infection status.  Positive results do  not rule out bacterial infection or co-infection with other  viruses. If result is PRESUMPTIVE POSTIVE SARS-CoV-2 nucleic acids MAY BE PRESENT.   A presumptive positive result was obtained on the submitted specimen  and confirmed on repeat testing.  While 2019 novel coronavirus  (SARS-CoV-2) nucleic acids may be present in the  submitted sample  additional confirmatory testing may be necessary for epidemiological  and / or clinical management purposes  to differentiate between  SARS-CoV-2 and other Sarbecovirus currently known to infect humans.  If clinically indicated additional testing with an alternate test  methodology [phone]) is advised. The SARS-CoV-2 RNA is generally  detectable in upper and lower respiratory sp ecimens during the acute  phase of infection. The expected result is Negative. Fact Sheet for Patients:  BoilerBrush.com.cy Fact Sheet for Healthcare Providers: https://pope.com/ This test is not yet approved or cleared by the [place] FDA and has been authorized for detection and/or diagnosis of SARS-CoV-2 by FDA under an Emergency Use Authorization (EUA).  This EUA will remain in effect (meaning this test can be used) for the duration of the COVID-19 declaration under Section 564(b)(1) of the Act, 21 U.S.C. section 360bbb-3(b)(1), unless the authorization is terminated or revoked sooner. Performed at [facility], [address] [place], [place] [address]   Type and screen [facility]     Status: None (Preliminary result)   Collection Time: 09/19/18 11:03 PM  Result Value Ref Range   ABO/RH(D) PENDING    Antibody Screen PENDING    Sample Expiration      09/22/2018,2359 Performed at [facility], [address] [place], [place] [address]   Urinalysis, Complete w Microscopic     Status: Abnormal   Collection Time: 09/19/18 11:03 PM  Result Value Ref Range   Color, Urine YELLOW (A) YELLOW   APPearance HAZY (A) CLEAR   Specific  Gravity, Urine 1.015 1.005 - 1.030   pH 7.0 5.0 - 8.0   Glucose, UA NEGATIVE NEGATIVE mg/dL   Hgb urine dipstick NEGATIVE NEGATIVE   Bilirubin Urine NEGATIVE NEGATIVE   Ketones, ur 5 (A) NEGATIVE mg/dL   Protein, ur NEGATIVE NEGATIVE mg/dL   Nitrite NEGATIVE NEGATIVE   Leukocytes,Ua NEGATIVE NEGATIVE   RBC / HPF 0-5 0 - 5 RBC/hpf   WBC, UA 0-5 0 - 5 WBC/hpf   Bacteria, UA NONE SEEN NONE SEEN   Squamous Epithelial / LPF 0-5 0 - 5   Mucus PRESENT    Amorphous Crystal PRESENT     Comment: Performed at [facility], [address] [place], [place] [address]  Type and screen Ordered by PROVIDER DEFAULT     Status: None   Collection Time: 09/20/18 12:46 AM  Result Value Ref Range   ABO/RH(D) B POS    Antibody Screen NEG    Sample Expiration      09/23/2018,2359 Performed at [facility], [address] [place], [place] [address]   Lactic acid, plasma     Status: None   Collection Time: 09/20/18 12:48 AM  Result Value Ref Range   Lactic Acid, Venous 1.2 0.5 - 1.9 mmol/L    Comment: Performed at [facility], [address], [place], [place] [address]   Dg Chest 1 View  Result Date: 09/19/2018 CLINICAL DATA:  Hip fracture EXAM: CHEST  1 VIEW COMPARISON:  04/24/2018 FINDINGS: Mild hyperinflation. No acute opacity or pleural effusion. Normal cardiomediastinal silhouette with aortic atherosclerosis. No pneumothorax. IMPRESSION: No active disease. Electronically Signed   By: Jasmine Pang M.D.   On: 09/19/2018 22:52   Dg Pelvis 1-2 Views  Result Date: 09/19/2018 CLINICAL DATA:  Fall with hip pain EXAM: PELVIS - 1-2 VIEW COMPARISON:  09/19/2018 FINDINGS: Acute comminuted left intertrochanteric fracture. The left femoral head projects in joint. Pubic symphysis and rami  are intact. Normal alignment right hip. IMPRESSION: Acute comminuted left intertrochanteric fracture Electronically Signed   By: Jasmine PangKim  Fujinaga M.D.   On: 09/19/2018 22:51   Dg Femur Min 2 Views  Left  Result Date: 09/19/2018 CLINICAL DATA:  73 year old female status post fall with hip pain. EXAM: LEFT FEMUR 2 VIEWS COMPARISON:  Pelvis radiograph today reported separately. FINDINGS: Comminuted left femur intertrochanteric fracture with mild displacement of fracture fragments. The left femoral head remains normally located. No superimposed fracture of the visible left hemipelvis. The left femoral shaft is intact. Preserved alignment at the left knee. No knee joint effusion identified. Distal femur appears intact. IMPRESSION: Acute comminuted left femur intertrochanteric fracture with mild displacement. Electronically Signed   By: Odessa FlemingH  Hall M.D.   On: 09/19/2018 22:51    Pending Labs Unresulted Labs (From admission, onward)    Start     Ordered   09/20/18 1207  Lactic acid, plasma  Once,   STAT     09/20/18 1207   09/20/18 0500  Basic metabolic panel  Tomorrow morning,   STAT     09/20/18 0146   09/20/18 0500  CBC  Tomorrow morning,   STAT     09/20/18 0146   09/19/18 2211  Blood culture (routine x 2)  BLOOD CULTURE X 2,   STAT     09/19/18 2210   09/19/18 2210  Urine culture  ONCE - STAT,   STAT     09/19/18 2210          Vitals/Pain Today's Vitals   09/19/18 2202 09/19/18 2343 09/20/18 0000 09/20/18 0030  BP: (!) 170/77 (!) 158/74 (!) 165/78 (!) 169/69  Pulse: 73 75 73 72  Resp: 14 19 (!) 22 16  Temp: 100.2 F (37.9 C) 98.7 F (37.1 C)    TempSrc: Oral Oral    SpO2: 93% 91% 99% 98%  Weight:        Isolation Precautions No active isolations  Medications Medications  traMADol (ULTRAM) tablet 50 mg (has no administration in time range)  donepezil (ARICEPT) tablet 5 mg (has no administration in time range)  memantine (NAMENDA) tablet 5 mg (has no administration in time range)  sertraline (ZOLOFT) tablet 50 mg (has no administration in time range)  albuterol (VENTOLIN HFA) 108 (90 Base) MCG/ACT inhaler 2 puff (has no administration in time range)  loratadine (CLARITIN)  tablet 10 mg (has no administration in time range)  fluticasone (FLONASE) 50 MCG/ACT nasal spray 2 spray (has no administration in time range)  0.9 %  sodium chloride infusion (has no administration in time range)  acetaminophen (TYLENOL) tablet 650 mg (has no administration in time range)    Or  acetaminophen (TYLENOL) suppository 650 mg (has no administration in time range)  traZODone (DESYREL) tablet 25 mg (has no administration in time range)  magnesium hydroxide (MILK OF MAGNESIA) suspension 30 mL (has no administration in time range)  ondansetron (ZOFRAN) tablet 4 mg (has no administration in time range)    Or  ondansetron (ZOFRAN) injection 4 mg (has no administration in time range)  morphine 2 MG/ML injection 2 mg (has no administration in time range)  fentaNYL (SUBLIMAZE) injection 50 mcg (50 mcg Intravenous Given 09/20/18 0016)  ondansetron (ZOFRAN) injection 4 mg (4 mg Intravenous Given 09/20/18 0021)    Mobility Normally on her own but not now  High fall risk   Focused Assessments n/a   R Recommendations: See Admitting Provider Note  Report given to:  Additional Notes:  Pt has some altered loc

## 2018-09-20 NOTE — Anesthesia Procedure Notes (Signed)
Procedure Name: Intubation Date/Time: 09/20/2018 5:39 PM Performed by: Lowry Bowl, CRNA Pre-anesthesia Checklist: Patient identified, Emergency Drugs available, Suction available and Patient being monitored Patient Re-evaluated:Patient Re-evaluated prior to induction Oxygen Delivery Method: Circle system utilized Preoxygenation: Pre-oxygenation with 100% oxygen Induction Type: IV induction Ventilation: Mask ventilation without difficulty Laryngoscope Size: Mac and 3 Grade View: Grade I Tube type: Oral Number of attempts: 1 Airway Equipment and Method: Stylet Placement Confirmation: ETT inserted through vocal cords under direct vision,  positive ETCO2 and breath sounds checked- equal and bilateral Secured at: 21 cm Tube secured with: Tape Dental Injury: Teeth and Oropharynx as per pre-operative assessment

## 2018-09-20 NOTE — ED Notes (Signed)
Pt linens changed, hygiene/peri care provided. Pt placed in clean diaper and purewick external catheter placed. Pt repositioned and resting comfortably.

## 2018-09-20 NOTE — Transfer of Care (Signed)
Immediate Anesthesia Transfer of Care Note  [name]  Procedure(s) Performed: INTRAMEDULLARY (IM) NAIL INTERTROCHANTRIC (Left )  Patient Location: PACU  Anesthesia Type:General  Level of Consciousness: awake, drowsy and patient cooperative  Airway & Oxygen Therapy: Patient Spontanous Breathing  Post-op Assessment: Report given to RN, Post -op Vital signs reviewed and stable and Patient moving all extremities  Post vital signs: Reviewed and stable  Last Vitals:  Vitals Value Taken Time  BP 126/80 09/20/18 1913  Temp 36.8 C 09/20/18 1913  Pulse 64 09/20/18 1920  Resp 15 09/20/18 1920  SpO2 99 % 09/20/18 1920  Vitals shown include unvalidated device data.  Last Pain:  Vitals:   09/20/18 1913  TempSrc:   PainSc: (P) Asleep         Complications: No apparent anesthesia complications

## 2018-09-20 NOTE — Progress Notes (Signed)
CN, Raquel Sarna notified family of patient that patient had not gone into surgery just yet.   Patient was originally scheduled for surgery time of 1400.  Patient to surgery approx 1700.

## 2018-09-20 NOTE — Progress Notes (Signed)
Report given to OR.

## 2018-09-20 NOTE — Progress Notes (Addendum)
[employer] [facility] [place] [facility]   PATIENT NAME: Kaylee KaysLavern Tomlinson    MR#:  [id number]  DATE OF BIRTH:  11/29/1945  SUBJECTIVE:  CHIEF COMPLAINT:   Chief Complaint  Patient presents with  . Fall  . Fever   -Fall and left hip fracture.  History of dementia. -For surgery today  REVIEW OF SYSTEMS:  Review of Systems  Unable to perform ROS: Dementia    DRUG ALLERGIES:   Allergies  Allergen Reactions  . Enalapril Other (See Comments)  . Naproxen Swelling  . Amoxicillin Rash  . Sulfa Antibiotics Rash    VITALS:  Blood pressure (!) 164/71, pulse 74, temperature 98.9 F (37.2 C), temperature source Oral, resp. rate 16, weight 58.6 kg, SpO2 100 %.  PHYSICAL EXAMINATION:  Physical Exam   GENERAL:  73 y.o.-year-old elderly patient lying in the bed with no acute distress.  EYES: Pupils equal, round, reactive to light and accommodation. No scleral icterus. Extraocular muscles intact.  HEENT: Head atraumatic, normocephalic. Oropharynx and nasopharynx clear.  NECK:  Supple, no jugular venous distention. No thyroid enlargement, no tenderness.  LUNGS: Normal breath sounds bilaterally, no wheezing, rales,rhonchi or crepitation. No use of accessory muscles of respiration.  Decreased bibasilar breath sounds CARDIOVASCULAR: S1, S2 normal. No  rubs, or gallops.  3/6 systolic murmur is present ABDOMEN: Soft, nontender, nondistended. Bowel sounds present. No organomegaly or mass.  EXTREMITIES: No pedal edema, cyanosis, or clubbing.  NEUROLOGIC: Cranial nerves II through XII are intact. Muscle strength 5/5 in all extremities except left leg due to pain. Sensation intact. Gait not checked.  PSYCHIATRIC: The patient is alert and oriented to self SKIN: No obvious rash, lesion, or ulcer.    LABORATORY PANEL:   CBC Recent Labs  Lab 09/20/18 0048  WBC 10.9*  HGB 13.8  HCT 42.7  PLT 268    ------------------------------------------------------------------------------------------------------------------  Chemistries  Recent Labs  Lab 09/19/18 2206 09/20/18 0048  NA 133* 134*  K 3.9 3.7  CL 99 100  CO2 23 26  GLUCOSE 101* 107*  BUN 12 12  CREATININE 0.80 0.73  CALCIUM 8.6* 8.7*  AST 19  --   ALT 12  --   ALKPHOS 103  --   BILITOT 0.9  --    ------------------------------------------------------------------------------------------------------------------  Cardiac Enzymes No results for input(s): TROPONINI in the last 168 hours. ------------------------------------------------------------------------------------------------------------------  RADIOLOGY:  Dg Chest 1 View  Result Date: 09/19/2018 CLINICAL DATA:  Hip fracture EXAM: CHEST  1 VIEW COMPARISON:  04/24/2018 FINDINGS: Mild hyperinflation. No acute opacity or pleural effusion. Normal cardiomediastinal silhouette with aortic atherosclerosis. No pneumothorax. IMPRESSION: No active disease. Electronically Signed   By: Jasmine PangKim  Fujinaga M.D.   On: 09/19/2018 22:52   Dg Pelvis 1-2 Views  Result Date: 09/19/2018 CLINICAL DATA:  Fall with hip pain EXAM: PELVIS - 1-2 VIEW COMPARISON:  09/19/2018 FINDINGS: Acute comminuted left intertrochanteric fracture. The left femoral head projects in joint. Pubic symphysis and rami are intact. Normal alignment right hip. IMPRESSION: Acute comminuted left intertrochanteric fracture Electronically Signed   By: Jasmine PangKim  Fujinaga M.D.   On: 09/19/2018 22:51   Dg Femur Min 2 Views Left  Result Date: 09/19/2018 CLINICAL DATA:  73 year old female status post fall with hip pain. EXAM: LEFT FEMUR 2 VIEWS COMPARISON:  Pelvis radiograph today reported separately. FINDINGS: Comminuted left femur intertrochanteric fracture with mild displacement of fracture fragments. The left femoral head remains normally located. No superimposed fracture of the visible left hemipelvis. The left femoral shaft is  intact. Preserved alignment at the left knee. No knee joint effusion identified. Distal femur appears intact. IMPRESSION: Acute comminuted left femur intertrochanteric fracture with mild displacement. Electronically Signed   By: Genevie Ann M.D.   On: 09/19/2018 22:51    EKG:   Orders placed or performed during the hospital encounter of 09/19/18  . ED EKG  . ED EKG  . EKG 12-Lead  . EKG 12-Lead    ASSESSMENT AND PLAN:   73 year old female with past medical history significant for dementia, arthritis presents from home after a fall and left hip fracture.  1.  Fall and left hip intertrochanteric fracture-appreciate orthopedics consult. -Low cardiac risk for surgery, pain medications -Possible surgery today.  Physical therapy will be started after surgery. -No UTI noted  2.  Dementia-seems to be pleasantly confused, at baseline -Continue Aricept, Namenda.  Also on Zoloft at home -At risk for postop delirium.  3.  Tobacco use disorder-started on nicotine patch  4.  DVT prophylaxis-will be started after surgery  PT consult after surgery, might need rehab Attempted to call patient's son and spouse on the numbers listed in the computer, unable to reach them or leave a voicemail    All the records are reviewed and case discussed with Care Management/Social Workerr. Management plans discussed with the patient, family and they are in agreement.  CODE STATUS: Full code  TOTAL TIME TAKING CARE OF THIS PATIENT: 39 minutes.   POSSIBLE D/C IN 2-3 DAYS, DEPENDING ON CLINICAL CONDITION.   Gladstone Lighter M.D on 09/20/2018 at 10:57 AM  Between 7am to 6pm - Pager - [phone]  After 6pm go to [email or web address] - password [email or web address]  Office  [phone]  CC: Primary care physician; Glendon Axe, MD

## 2018-09-20 NOTE — Progress Notes (Signed)
PHARMACY - PHYSICIAN COMMUNICATION CRITICAL VALUE ALERT - BLOOD CULTURE IDENTIFICATION (BCID)  Kaylee Robinson is an 73 y.o. female who presented to [facility] on 09/19/2018 with a chief complaint of hip fracture  Assessment:  Blood culture with 1 of 4 bottles (aerobic) growing GPC, BCID identified Staph species MEC A not detected. Patient has been afebrile.   Name of physician (or Provider) Contacted: Kalisetti  Current antibiotics: none  Changes to prescribed antibiotics recommended:  No antibiotics at this time, likely represents contamination.  Results for orders placed or performed during the hospital encounter of 09/19/18  Blood Culture ID Panel (Reflexed) (Collected: 09/19/2018 11:03 PM)  Result Value Ref Range   Enterococcus species NOT DETECTED NOT DETECTED   Listeria monocytogenes NOT DETECTED NOT DETECTED   Staphylococcus species DETECTED (A) NOT DETECTED   Staphylococcus aureus (BCID) NOT DETECTED NOT DETECTED   Methicillin resistance NOT DETECTED NOT DETECTED   Streptococcus species NOT DETECTED NOT DETECTED   Streptococcus agalactiae NOT DETECTED NOT DETECTED   Streptococcus pneumoniae NOT DETECTED NOT DETECTED   Streptococcus pyogenes NOT DETECTED NOT DETECTED   Acinetobacter baumannii NOT DETECTED NOT DETECTED   Enterobacteriaceae species NOT DETECTED NOT DETECTED   Enterobacter cloacae complex NOT DETECTED NOT DETECTED   Escherichia coli NOT DETECTED NOT DETECTED   Klebsiella oxytoca NOT DETECTED NOT DETECTED   Klebsiella pneumoniae NOT DETECTED NOT DETECTED   Proteus species NOT DETECTED NOT DETECTED   Serratia marcescens NOT DETECTED NOT DETECTED   Haemophilus influenzae NOT DETECTED NOT DETECTED   Neisseria meningitidis NOT DETECTED NOT DETECTED   Pseudomonas aeruginosa NOT DETECTED NOT DETECTED   Candida albicans NOT DETECTED NOT DETECTED   Candida glabrata NOT DETECTED NOT DETECTED   Candida krusei NOT DETECTED NOT DETECTED   Candida parapsilosis NOT DETECTED  NOT DETECTED   Candida tropicalis NOT DETECTED NOT DETECTED    Paulina Fusi, PharmD, BCPS 09/20/2018 5:22 PM

## 2018-09-20 NOTE — Anesthesia Post-op Follow-up Note (Signed)
Anesthesia QCDR form completed.        

## 2018-09-20 NOTE — Consult Note (Signed)
ORTHOPAEDIC CONSULTATION  REQUESTING PHYSICIAN: Enid BaasKalisetti, Radhika, MD  Chief Complaint: Left hip pain status post fall  HPI: Kaylee Robinson is a 73 y.o. female who complains of left hip pain status post fall.  At the bedside this morning, the patient does not recall the mechanism of injury.  The ER admission note states that she fell in her garden.  There is no reported history of syncope.  Patient has a history of Alzheimer's dementia.  Past Medical History:  Diagnosis Date  . Arthritis    bilateral knees   Past Surgical History:  Procedure Laterality Date  . TONSILLECTOMY     adnoidectomy   Social History   Socioeconomic History  . Marital status: Married    Spouse name: Not on file  . Number of children: Not on file  . Years of education: Not on file  . Highest education level: Not on file  Occupational History  . Not on file  Social Needs  . Financial resource strain: Not on file  . Food insecurity    Worry: Not on file    Inability: Not on file  . Transportation needs    Medical: Not on file    Non-medical: Not on file  Tobacco Use  . Smoking status: Current Some Day Smoker    Packs/day: 1.00    Types: Cigarettes  . Smokeless tobacco: Never Used  Substance and Sexual Activity  . Alcohol use: No  . Drug use: Never  . Sexual activity: Not on file  Lifestyle  . Physical activity    Days per week: Not on file    Minutes per session: Not on file  . Stress: Not on file  Relationships  . Social Musicianconnections    Talks on phone: Not on file    Gets together: Not on file    Attends religious service: Not on file    Active member of club or organization: Not on file    Attends meetings of clubs or organizations: Not on file    Relationship status: Not on file  Other Topics Concern  . Not on file  Social History Narrative  . Not on file   Family History  Problem Relation Age of Onset  . Breast cancer Neg Hx    Allergies  Allergen Reactions  . Enalapril  Other (See Comments)  . Naproxen Swelling  . Amoxicillin Rash  . Sulfa Antibiotics Rash   Prior to Admission medications   Medication Sig Start Date End Date Taking? Authorizing Provider  acetaminophen (TYLENOL) 500 MG tablet Take 500 mg by mouth every 6 (six) hours as needed.    Yes [provider]  albuterol (PROVENTIL HFA;VENTOLIN HFA) 108 (90 Base) MCG/ACT inhaler Inhale 2 puffs into the lungs every 6 (six) hours as needed for wheezing or shortness of breath. 09/11/15  Yes Phineas SemenGoodman, Graydon, MD  cetirizine (ZYRTEC) 10 MG tablet Take 10 mg by mouth daily. 09/05/18  Yes [provider]  donepezil (ARICEPT) 5 MG tablet Take 5 mg by mouth at bedtime.   Yes [provider]  fluticasone (FLONASE) 50 MCG/ACT nasal spray USE 2 SPRAY(S) IN EACH NOSTRIL ONCE DAILY 09/05/18  Yes [provider]  memantine (NAMENDA) 5 MG tablet Take 5 mg by mouth 2 (two) times daily. 08/07/18  Yes [provider]  sertraline (ZOLOFT) 50 MG tablet Take 50 mg by mouth daily. 09/12/18  Yes [provider]  traMADol (ULTRAM) 50 MG tablet Take 50 mg by mouth every 12 (twelve)  hours as needed.    Yes [provider]   Dg Chest 1 View  Result Date: 09/19/2018 CLINICAL DATA:  Hip fracture EXAM: CHEST  1 VIEW COMPARISON:  04/24/2018 FINDINGS: Mild hyperinflation. No acute opacity or pleural effusion. Normal cardiomediastinal silhouette with aortic atherosclerosis. No pneumothorax. IMPRESSION: No active disease. Electronically Signed   By: Donavan Foil M.D.   On: 09/19/2018 22:52   Dg Pelvis 1-2 Views  Result Date: 09/19/2018 CLINICAL DATA:  Fall with hip pain EXAM: PELVIS - 1-2 VIEW COMPARISON:  09/19/2018 FINDINGS: Acute comminuted left intertrochanteric fracture. The left femoral head projects in joint. Pubic symphysis and rami are intact. Normal alignment right hip. IMPRESSION: Acute comminuted left intertrochanteric fracture Electronically Signed   By: Donavan Foil  M.D.   On: 09/19/2018 22:51   Dg Femur Min 2 Views Left  Result Date: 09/19/2018 CLINICAL DATA:  73 year old female status post fall with hip pain. EXAM: LEFT FEMUR 2 VIEWS COMPARISON:  Pelvis radiograph today reported separately. FINDINGS: Comminuted left femur intertrochanteric fracture with mild displacement of fracture fragments. The left femoral head remains normally located. No superimposed fracture of the visible left hemipelvis. The left femoral shaft is intact. Preserved alignment at the left knee. No knee joint effusion identified. Distal femur appears intact. IMPRESSION: Acute comminuted left femur intertrochanteric fracture with mild displacement. Electronically Signed   By: Genevie Ann M.D.   On: 09/19/2018 22:51    Positive ROS: All other systems have been reviewed and were otherwise negative with the exception of those mentioned in the HPI and as above.  Physical Exam: General: Alert, no acute distress  MUSCULOSKELETAL: Left lower extremity: Patient skin is intact.  There is no erythema, ecchymosis or swelling.  Her left thigh and leg compartments are soft and compressible.  Patient is lying on her right side.  She has palpable pedal pulses, intact sensation light touch and intact motor function of her left lower extremity.  Assessment: Closed, comminuted left intertrochanteric hip fracture  Plan: I have explained to the patient that she has a left hip fracture.  I have recommended intramedullary fixation for this injury.  The patient's dementia I spoke with her son Kaylee Robinson and explained to him the details of the operation as well as the postoperative course.  I discussed the risks and benefits of surgery with her son.  He understands the risks include but are not limited to infection, bleeding requiring blood transfusion, nerve or blood vessel injury, joint stiffness or loss of motion, persistent pain, weakness or instability, malunion, nonunion and hardware failure and the need for  further surgery. Medical risks include but are not limited to DVT and pulmonary embolism, myocardial infarction, stroke, pneumonia, respiratory failure and death. Patient's son understood these risks and was in agreement with the plan for surgery.   Patient has been cleared for surgery by the hospitalist service.  Reviewed the patient's laboratory studies and radiographic films in preparation for this case.    Thornton Park, MD    09/20/2018 10:43 AM

## 2018-09-21 LAB — BASIC METABOLIC PANEL
Anion gap: 9 (ref 5–15)
BUN: 16 mg/dL (ref 8–23)
CO2: 24 mmol/L (ref 22–32)
Calcium: 8.3 mg/dL — ABNORMAL LOW (ref 8.9–10.3)
Chloride: 101 mmol/L (ref 98–111)
Creatinine, Ser: 0.62 mg/dL (ref 0.44–1.00)
GFR calc Af Amer: >60 mL/min (ref >60)
GFR calc non Af Amer: >60 mL/min (ref >60)
Glucose, Bld: 126 mg/dL — ABNORMAL HIGH (ref 70–99)
Potassium: 4.6 mmol/L (ref 3.5–5.1)
Sodium: 134 mmol/L — ABNORMAL LOW (ref 135–145)

## 2018-09-21 LAB — CBC
HCT: 37.7 % (ref 36.0–46.0)
Hemoglobin: 12.4 g/dL (ref 12.0–15.0)
MCH: 29.8 pg (ref 26.0–34.0)
MCHC: 32.9 g/dL (ref 30.0–36.0)
MCV: 90.6 fL (ref 80.0–100.0)
Platelets: 230 10*3/uL (ref 150–400)
RBC: 4.16 MIL/uL (ref 3.87–5.11)
RDW: 12 % (ref 11.5–15.5)
WBC: 9.4 10*3/uL (ref 4.0–10.5)
nRBC: 0 % (ref 0.0–0.2)

## 2018-09-21 LAB — URINE CULTURE: Culture: NO GROWTH

## 2018-09-21 MED ORDER — HALOPERIDOL LACTATE 5 MG/ML IJ SOLN
1.0000 mg | Freq: Four times a day (QID) | INTRAMUSCULAR | Status: DC | PRN
  Administered 2018-09-21 – 2018-09-22 (×3): 2 mg via INTRAVENOUS
  Filled 2018-09-21 (×3): qty 1

## 2018-09-21 MED ORDER — MELATONIN 5 MG PO TABS
5.0000 mg | ORAL_TABLET | Freq: Every evening | ORAL | Status: DC | PRN
  Administered 2018-09-22 (×2): 5 mg via ORAL
  Filled 2018-09-21 (×3): qty 1

## 2018-09-21 MED ORDER — SODIUM CHLORIDE 0.9 % IV BOLUS
500.0000 mL | Freq: Once | INTRAVENOUS | Status: AC
  Administered 2018-09-21: 500 mL via INTRAVENOUS

## 2018-09-21 MED ORDER — HALOPERIDOL LACTATE 5 MG/ML IJ SOLN: 1.0000 mg | Freq: Four times a day (QID) | INTRAMUSCULAR | Status: DC | PRN

## 2018-09-21 MED ORDER — NON FORMULARY: 2.5000 mg | Freq: Every evening | Status: DC | PRN

## 2018-09-21 NOTE — Progress Notes (Signed)
Subjective:  POD #1 s/p intramedullary fixation of left intertrochanteric hip fracture.   Patient reports left hip pain as mild.  Patient is up out of bed to a chair.  Her daughter-in-law who is also her caretaker is at the bedside.   Objective:   VITALS:   Vitals:   09/21/18 0502 09/21/18 0554 09/21/18 0729 09/21/18 1130  BP: (!) 82/64 (!) 96/53 105/61 (!) 97/49  Pulse: 60 (!) 55 (!) 57 68  Resp: 18     Temp: 97.7 F (36.5 C)  97.7 F (36.5 C) 97.9 F (36.6 C)  TempSrc:   Oral Oral  SpO2: 100%  100% 98%  Weight:        PHYSICAL EXAM: Left lower extremity Neurovascular intact Sensation intact distally Intact pulses distally Dorsiflexion/Plantar flexion intact Incision: moderate drainage from the proximal incision onto honeycomb dressing No cellulitis present Compartment soft  LABS  Results for orders placed or performed during the hospital encounter of 09/19/18 (from the past 24 hour(s))  CBC     Status: None   Collection Time: 09/21/18  6:29 AM  Result Value Ref Range   WBC 9.4 4.0 - 10.5 K/uL   RBC 4.16 3.87 - 5.11 MIL/uL   Hemoglobin 12.4 12.0 - 15.0 g/dL   HCT 37.7 36.0 - 46.0 %   MCV 90.6 80.0 - 100.0 fL   MCH 29.8 26.0 - 34.0 pg   MCHC 32.9 30.0 - 36.0 g/dL   RDW 12.0 11.5 - 15.5 %   Platelets 230 150 - 400 K/uL   nRBC 0.0 0.0 - 0.2 %  Basic metabolic panel     Status: Abnormal   Collection Time: 09/21/18  6:29 AM  Result Value Ref Range   Sodium 134 (L) 135 - 145 mmol/L   Potassium 4.6 3.5 - 5.1 mmol/L   Chloride 101 98 - 111 mmol/L   CO2 24 22 - 32 mmol/L   Glucose, Bld 126 (H) 70 - 99 mg/dL   BUN 16 8 - 23 mg/dL   Creatinine, Ser 0.62 0.44 - 1.00 mg/dL   Calcium 8.3 (L) 8.9 - 10.3 mg/dL   GFR calc non Af Amer >60 >60 mL/min   GFR calc Af Amer >60 >60 mL/min   Anion gap 9 5 - 15    Dg Chest 1 View  Result Date: 09/19/2018 CLINICAL DATA:  Hip fracture EXAM: CHEST  1 VIEW COMPARISON:  04/24/2018 FINDINGS: Mild hyperinflation. No acute opacity or  pleural effusion. Normal cardiomediastinal silhouette with aortic atherosclerosis. No pneumothorax. IMPRESSION: No active disease. Electronically Signed   By: Donavan Foil M.D.   On: 09/19/2018 22:52   Dg Pelvis 1-2 Views  Result Date: 09/19/2018 CLINICAL DATA:  Fall with hip pain EXAM: PELVIS - 1-2 VIEW COMPARISON:  09/19/2018 FINDINGS: Acute comminuted left intertrochanteric fracture. The left femoral head projects in joint. Pubic symphysis and rami are intact. Normal alignment right hip. IMPRESSION: Acute comminuted left intertrochanteric fracture Electronically Signed   By: Donavan Foil M.D.   On: 09/19/2018 22:51   Dg C-arm 1-60 Min  Result Date: 09/20/2018 CLINICAL DATA:  Femur fracture EXAM: LEFT FEMUR 2 VIEWS; DG C-ARM 61-120 MIN COMPARISON:  09/19/2018 FINDINGS: Twenty-one low resolution intraoperative spot views of the left femur. Total fluoroscopy time was 53 seconds. The images demonstrate a comminuted left intertrochanteric fracture with reduction of fracture fragments. Subsequent intramedullary rod and distal screw fixation of the left femur. IMPRESSION: Intraoperative fluoroscopic assistance provided during surgical fixation of left femur intertrochanteric  fracture. Electronically Signed   By: Jasmine PangKim  Fujinaga M.D.   On: 09/20/2018 19:21   Dg Femur Min 2 Views Left  Result Date: 09/20/2018 CLINICAL DATA:  Femur fracture EXAM: LEFT FEMUR 2 VIEWS; DG C-ARM 61-120 MIN COMPARISON:  09/19/2018 FINDINGS: Twenty-one low resolution intraoperative spot views of the left femur. Total fluoroscopy time was 53 seconds. The images demonstrate a comminuted left intertrochanteric fracture with reduction of fracture fragments. Subsequent intramedullary rod and distal screw fixation of the left femur. IMPRESSION: Intraoperative fluoroscopic assistance provided during surgical fixation of left femur intertrochanteric fracture. Electronically Signed   By: Jasmine PangKim  Fujinaga M.D.   On: 09/20/2018 19:21   Dg Femur Min  2 Views Left  Result Date: 09/19/2018 CLINICAL DATA:  73 year old female status post fall with hip pain. EXAM: LEFT FEMUR 2 VIEWS COMPARISON:  Pelvis radiograph today reported separately. FINDINGS: Comminuted left femur intertrochanteric fracture with mild displacement of fracture fragments. The left femoral head remains normally located. No superimposed fracture of the visible left hemipelvis. The left femoral shaft is intact. Preserved alignment at the left knee. No knee joint effusion identified. Distal femur appears intact. IMPRESSION: Acute comminuted left femur intertrochanteric fracture with mild displacement. Electronically Signed   By: Odessa FlemingH  Hall M.D.   On: 09/19/2018 22:51   Dg Femur Port Min 2 Views Left  Result Date: 09/20/2018 CLINICAL DATA:  Postop hip fracture EXAM: LEFT FEMUR PORTABLE 2 VIEWS COMPARISON:  09/19/2018 FINDINGS: Interval intramedullary rod and distal screw fixation of the left femur for intertrochanteric fracture. Gas in the soft tissues consistent with recent surgery. Intact hardware and anatomic alignment. IMPRESSION: Interval intramedullary rod and screw fixation of left femur for intertrochanteric fracture with expected postsurgical change Electronically Signed   By: Jasmine PangKim  Fujinaga M.D.   On: 09/20/2018 19:55    Assessment/Plan: 1 Day Post-Op   Active Problems:   Closed left hip fracture (HCC)  Patient is stable postop.  She has had some episodes of hypotension with systolic pressures into the 80-90s and heart rate in the 50s.  I have ordered a 500 cc bolus of NS.  Her hemoglobin and hematocrit are stable.  Will use narcotics sparingly to avoid further hypotension.  Continue physical therapy.  Foley catheter is been removed.  Continue to monitor labs.  Patient will begin Lovenox for DVT prophylaxis.  The family would like to take the patient home upon discharge from the hospital.    Juanell FairlyKevin Jayon Matton , MD 09/21/2018, 1:35 PM

## 2018-09-21 NOTE — Progress Notes (Signed)
Spoke to [employer], son stating that pt takes 2.5mg  of Melatonin at night to help her sleep. Per NP may order the melatonin.

## 2018-09-21 NOTE — Progress Notes (Signed)
[facility] [place] [facility]   PATIENT NAME: Kaylee KaysLavern Robinson    MR#:  [id number]  DATE OF BIRTH:  09/06/1945  SUBJECTIVE:  CHIEF COMPLAINT:   Chief Complaint  Patient presents with  . Fall  . Fever   Patient is seen at the bedside. Family at the bedside. Patient has hx of dementia so subjective hx obtained for family. Per family, patient had difficulty sleeping last night with intermittent confusion. She denies pain this morning.   REVIEW OF SYSTEMS:  Review of Systems  Unable to perform ROS: Dementia   DRUG ALLERGIES:   Allergies  Allergen Reactions  . Enalapril Other (See Comments)  . Naproxen Swelling  . Amoxicillin Rash  . Sulfa Antibiotics Rash    VITALS:  Blood pressure 105/61, pulse (!) 57, temperature 97.7 F (36.5 C), temperature source Oral, resp. rate 18, weight 58.6 kg, SpO2 100 %.  PHYSICAL EXAMINATION:  Physical Exam   GENERAL:  73 y.o.-year-old elderly patient lying in the bed with no acute distress.  EYES: Pupils equal, round, reactive to light and accommodation. No scleral icterus. Extraocular muscles intact.  HEENT: Head atraumatic, normocephalic. Oropharynx and nasopharynx clear.  NECK:  Supple, no jugular venous distention. No thyroid enlargement, no tenderness.  LUNGS: Normal breath sounds bilaterally, no wheezing, rales,rhonchi or crepitation. No use of accessory muscles of respiration.  Decreased bibasilar breath sounds CARDIOVASCULAR: S1, S2 normal. No  rubs, or gallops.  3/6 systolic murmur is present ABDOMEN: Soft, nontender, nondistended. Bowel sounds present. No organomegaly or mass.  EXTREMITIES: No pedal edema, cyanosis, or clubbing. Decreased ROM in the left Hip due to pain post op. Wound dry and clean, no significant swelling or erythema. NEUROLOGIC: Cranial nerves II through XII are intact. Muscle strength 5/5 in all extremities except left leg due to pain. Sensation intact. Gait not checked.  PSYCHIATRIC: The  patient is alert and oriented to self. Pleasantly confused, was bale to identify family at the bedside by name and relationship to her. SKIN: No obvious rash, lesion, or ulcer.   LABORATORY PANEL:   CBC Recent Labs  Lab 09/21/18 0629  WBC 9.4  HGB 12.4  HCT 37.7  PLT 230   ------------------------------------------------------------------------------------------------------------------  Chemistries  Recent Labs  Lab 09/19/18 2206  09/21/18 0629  NA 133*   < > 134*  K 3.9   < > 4.6  CL 99   < > 101  CO2 23   < > 24  GLUCOSE 101*   < > 126*  BUN 12   < > 16  CREATININE 0.80   < > 0.62  CALCIUM 8.6*   < > 8.3*  AST 19  --   --   ALT 12  --   --   ALKPHOS 103  --   --   BILITOT 0.9  --   --    < > = values in this interval not displayed.   ------------------------------------------------------------------------------------------------------------------  Cardiac Enzymes No results for input(s): TROPONINI in the last 168 hours. ------------------------------------------------------------------------------------------------------------------  RADIOLOGY:  Dg Chest 1 View  Result Date: 09/19/2018 CLINICAL DATA:  Hip fracture EXAM: CHEST  1 VIEW COMPARISON:  04/24/2018 FINDINGS: Mild hyperinflation. No acute opacity or pleural effusion. Normal cardiomediastinal silhouette with aortic atherosclerosis. No pneumothorax. IMPRESSION: No active disease. Electronically Signed   By: Jasmine PangKim  Fujinaga M.D.   On: 09/19/2018 22:52   Dg Pelvis 1-2 Views  Result Date: 09/19/2018 CLINICAL DATA:  Fall with hip pain EXAM: PELVIS -  1-2 VIEW COMPARISON:  09/19/2018 FINDINGS: Acute comminuted left intertrochanteric fracture. The left femoral head projects in joint. Pubic symphysis and rami are intact. Normal alignment right hip. IMPRESSION: Acute comminuted left intertrochanteric fracture Electronically Signed   By: Donavan Foil M.D.   On: 09/19/2018 22:51   Dg C-arm 1-60 Min  Result Date:  09/20/2018 CLINICAL DATA:  Femur fracture EXAM: LEFT FEMUR 2 VIEWS; DG C-ARM 61-120 MIN COMPARISON:  09/19/2018 FINDINGS: Twenty-one low resolution intraoperative spot views of the left femur. Total fluoroscopy time was 53 seconds. The images demonstrate a comminuted left intertrochanteric fracture with reduction of fracture fragments. Subsequent intramedullary rod and distal screw fixation of the left femur. IMPRESSION: Intraoperative fluoroscopic assistance provided during surgical fixation of left femur intertrochanteric fracture. Electronically Signed   By: Donavan Foil M.D.   On: 09/20/2018 19:21   Dg Femur Min 2 Views Left  Result Date: 09/20/2018 CLINICAL DATA:  Femur fracture EXAM: LEFT FEMUR 2 VIEWS; DG C-ARM 61-120 MIN COMPARISON:  09/19/2018 FINDINGS: Twenty-one low resolution intraoperative spot views of the left femur. Total fluoroscopy time was 53 seconds. The images demonstrate a comminuted left intertrochanteric fracture with reduction of fracture fragments. Subsequent intramedullary rod and distal screw fixation of the left femur. IMPRESSION: Intraoperative fluoroscopic assistance provided during surgical fixation of left femur intertrochanteric fracture. Electronically Signed   By: Donavan Foil M.D.   On: 09/20/2018 19:21   Dg Femur Min 2 Views Left  Result Date: 09/19/2018 CLINICAL DATA:  73 year old female status post fall with hip pain. EXAM: LEFT FEMUR 2 VIEWS COMPARISON:  Pelvis radiograph today reported separately. FINDINGS: Comminuted left femur intertrochanteric fracture with mild displacement of fracture fragments. The left femoral head remains normally located. No superimposed fracture of the visible left hemipelvis. The left femoral shaft is intact. Preserved alignment at the left knee. No knee joint effusion identified. Distal femur appears intact. IMPRESSION: Acute comminuted left femur intertrochanteric fracture with mild displacement. Electronically Signed   By: Genevie Ann M.D.    On: 09/19/2018 22:51   Dg Femur Port Min 2 Views Left  Result Date: 09/20/2018 CLINICAL DATA:  Postop hip fracture EXAM: LEFT FEMUR PORTABLE 2 VIEWS COMPARISON:  09/19/2018 FINDINGS: Interval intramedullary rod and distal screw fixation of the left femur for intertrochanteric fracture. Gas in the soft tissues consistent with recent surgery. Intact hardware and anatomic alignment. IMPRESSION: Interval intramedullary rod and screw fixation of left femur for intertrochanteric fracture with expected postsurgical change Electronically Signed   By: Donavan Foil M.D.   On: 09/20/2018 19:55    EKG:   Orders placed or performed during the hospital encounter of 09/19/18  . ED EKG  . ED EKG  . EKG 12-Lead  . EKG 12-Lead    ASSESSMENT AND PLAN:   73 year old female with past medical history significant for dementia, arthritis presents from home after a fall and left hip fracture.  1. Left intertrochanteric hip fracture - s/p intermedullary fixation of left intertrochanteric hip fx POD # 1  - prn pain management - PT/OT consult - Ortho input appreciated  2. Moderate Alzheimer's Dementia- Without behavioral distubances - Continue Aricept, Namenda - Will start Melatonin at bedtime to help with sleep as she is at risk for delirium  3. Depression - Continue Zoloft  4. Tobacco use disorder-started on nicotine patch  5. DVT prophylaxis- Start Lovenox   All the records are reviewed and case discussed with Care Management/Social Workerr. Management plans discussed with the patient, family and they are  in agreement.  CODE STATUS: Full code  TOTAL TIME TAKING CARE OF THIS PATIENT: 39 minutes.   POSSIBLE D/C IN 2-3 DAYS, DEPENDING ON CLINICAL CONDITION.   Loraine LericheElizabeth A Thy Gullikson DNP on 09/21/2018 at 8:35 AM  Between 7am to 6pm - Pager - [phone]  After 6pm go to [email or web address] - Social research officer, governmentpassword EPAS [facility]  [facility]  Office  [phone]  CC: Primary care physician; Leotis ShamesSingh,  Jasmine, MD

## 2018-09-21 NOTE — Plan of Care (Signed)

## 2018-09-21 NOTE — Evaluation (Signed)
Physical Therapy Evaluation Patient Details Name: Kaylee Robinson MRN: [id number] DOB: 04/10/1945 Today's Date: 09/21/2018   History of Present Illness  Kaylee Robinson is a 53yoF who sustained a fall at home tripping over her deaf dog [place], subsequent Left hip fracture, now s/p Left femur ORIF 09/20/18. Pt typically is fully independent, no falls history, no assistive device for mobility.  Clinical Impression  Pt admitted with above diagnosis. Pt currently with functional limitations due to the deficits listed below (see "PT Problem List"). Upon entry, pt in bed, awake and agreeable to participate. The pt is alert and oriented to self, know she is not at home. Pt is generally pleasant but understandably anxious as she has postoperative soreness and requires reorientation to fracture/ORIF q 10-15 seconds. Pt is largely conversational and is substantially altered at time of evaluation per DIL, particularly as she usually doe snot exhibit such short term memory difficulty.  Max-total A for getting to EOB, fair tolerance to bed level exercises. modA to stand and heavy cues to navigate a pivot to the chair.  Functional mobility assessment demonstrates increased effort/time requirements, poor tolerance, and absolute need for physical assistance, whereas the patient performed these at a higher level of independence PTA. Pt will benefit from skilled PT intervention to increase independence and safety with basic mobility in preparation for discharge to the venue listed below.       Follow Up Recommendations SNF;Supervision/Assistance - 24 hour;Other (comment)(DIL reports family will likely elecct to take patient back to home 2/2 concerns regarding dementia)    Equipment Recommendations  Wheelchair (measurements PT);Wheelchair cushion (measurements PT);Other (comment)(elecated leg rests.)    Recommendations for Other Services       Precautions / Restrictions Precautions Precautions: Fall Restrictions LLE  Weight Bearing: Weight bearing as tolerated      Mobility  Bed Mobility Overal bed mobility: Needs Assistance Bed Mobility: Supine to Sit     Supine to sit: Max assist     General bed mobility comments: anxious, painful, cooperative  Transfers Overall transfer level: Needs assistance Equipment used: Rolling walker (2 wheeled);1 person hand held assist Transfers: Sit to/from [facility] Sit to Stand: Mod assist(modA and 30+ seconds to fully rise 2/2 pain) Stand pivot transfers: Mod assist(modA and 30+ seconds to fully rise 2/2 pain; heavy verbal/tactile cues for turing toward chair adn stepping; pt needs minA to slide left foot occasionally)          Ambulation/Gait Ambulation/Gait assistance: (unable/not appropraite at this time)              Science writer    Modified Rankin (Stroke Patients Only)       Balance Overall balance assessment: Needs assistance Sitting-balance support: Single extremity supported;Feet supported Sitting balance-Leahy Scale: Fair     Standing balance support: During functional activity;Bilateral upper extremity supported Standing balance-Leahy Scale: Poor                               Pertinent Vitals/Pain Pain Assessment: (pt is altered, does nto rate when asked multiple times; reports as 'sore') Pain Location: Left hip Pain Intervention(s): Limited activity within patient's tolerance;Monitored during session;Premedicated before session;Repositioned;Patient requesting pain meds-RN notified    Home Living Family/patient expects to be discharged to:: Private residence([place] DIL provides) Living Arrangements: Spouse/significant other;Children;Other relatives(Son, grandson, DIL, husband) Available Help at Discharge: Family Type of Home:  House Home Access: Stairs to enter   Entergy CorporationEntrance Stairs-Number of Steps: 3 steps back, 2 steps front; no railings Home Layout: One  level Home Equipment: Walker - 2 wheels;Cane - single point      Prior Function Level of Independence: Independent with assistive device(s)         Comments: Typically independent with AMB, ADL, no AD for AMB.     Hand Dominance   Dominant Hand: Right    Extremity/Trunk Assessment   Upper Extremity Assessment Upper Extremity Assessment: Generalized weakness    Lower Extremity Assessment Lower Extremity Assessment: Generalized weakness       Communication      Cognition Arousal/Alertness: Awake/alert Behavior During Therapy: WFL for tasks assessed/performed Overall Cognitive Status: Impaired/Different from baseline Area of Impairment: Memory;Orientation                 Orientation Level: Disoriented to;Time;Situation   Memory: Decreased recall of precautions;Decreased short-term memory                General Comments      Exercises General Exercises - Lower Extremity Ankle Circles/Pumps: AROM;Both;5 reps;Limitations Ankle Circles/Pumps Limitations: very limited amplitude on Left Heel Slides: AAROM;Left;10 reps;Supine Hip ABduction/ADduction: AAROM;Left;10 reps;Supine   Assessment/Plan    PT Assessment Patient needs continued PT services  PT Problem List Decreased strength;Decreased range of motion;Decreased activity tolerance;Decreased balance;Decreased mobility;Decreased safety awareness       PT Treatment Interventions Gait training;DME instruction;Balance training;Stair training;Functional mobility training;Therapeutic activities;Therapeutic exercise;Patient/family education;Cognitive remediation    PT Goals (Current goals can be found in the Care Plan section)  Acute Rehab PT Goals Patient Stated Goal: go home PT Goal Formulation: With patient/family Time For Goal Achievement: 10/05/18 Potential to Achieve Goals: Fair    Frequency BID   Barriers to discharge Inaccessible home environment stairs without rail to enter     Co-evaluation               AM-PAC PT "6 Clicks" Mobility  Outcome Measure Help needed turning from your back to your side while in a flat bed without using bedrails?: Total Help needed moving from lying on your back to sitting on the side of a flat bed without using bedrails?: Total Help needed moving to and from a bed to a chair (including a wheelchair)?: A Lot Help needed standing up from a chair using your arms (e.g., wheelchair or bedside chair)?: A Lot Help needed to walk in hospital room?: Total Help needed climbing 3-5 steps with a railing? : Total 6 Click Score: 8    End of Session Equipment Utilized During Treatment: Gait belt Activity Tolerance: Patient tolerated treatment well;Patient limited by pain;Patient limited by fatigue;Other (comment)(AMS, requires reorientation to situiation every 5-10 seconds.) Patient left: in chair;with family/visitor present;with call bell/phone within reach;with chair alarm set;with SCD's reapplied Nurse Communication: Mobility status PT Visit Diagnosis: Unsteadiness on feet (R26.81);Difficulty in walking, not elsewhere classified (R26.2);Muscle weakness (generalized) (M62.81)    Time: [id number] PT Time Calculation (min) (ACUTE ONLY): 47 min   Charges:   PT Evaluation $PT Eval Low Complexity: 1 Low PT Treatments $Gait Training: 8-22 mins $Therapeutic Exercise: 8-22 mins        12:04 PM, 09/21/18 Rosamaria LintsAllan C Buccola, PT, DPT Physical Therapist - [facility]  [phone] (ASCOM)   Buccola,Allan C 09/21/2018, 12:01 PM

## 2018-09-22 LAB — CULTURE, BLOOD (ROUTINE X 2): Special Requests: ADEQUATE

## 2018-09-22 MED ORDER — OLANZAPINE 5 MG PO TABS
2.5000 mg | ORAL_TABLET | Freq: Two times a day (BID) | ORAL | Status: DC | PRN
  Administered 2018-09-22 (×2): 2.5 mg via ORAL
  Filled 2018-09-22 (×2): qty 1

## 2018-09-22 NOTE — TOC Transition Note (Signed)
Transition of Care [facility]) - CM/SW Discharge Note   Patient Details  Name: Kaylee Robinson MRN: [id number] Date of Birth: [birth year]  [facility] CM/SW Contact:  Latanya Maudlin, RN Phone Number: 09/22/2018, 8:55 AM   Clinical Narrative:  [facility] team spoke with son Eddie Dibbles about transition of care needs. Eddie Dibbles is still unsure about what they would like to do at discharge. He fells as if she has had some issues dealing with anesthesia etc and feels she will eventually do better with PT. He is not opposed to short term rehab if she does not improve with her PT abilities. Bed search was initiated by previous [facility] team member. Paul's spouse is more familiar with agencies in the area and they are reviewing those now. With Pauls permission I have also notified Helene Kelp at [facility] of possible need for home health if patient is able to progress enough. If patient does discharge home a wheelchair will likely be needed. Our team will continue to follow for disposition and are will be able to assist with either discharge plan.        Barriers to Discharge: Continued Medical Work up   Patient Goals and CMS Choice Patient states their goals for this hospitalization and ongoing recovery are:: Pain control      Discharge Placement                       Discharge Plan and Services In-house Referral: Clinical Social Work Discharge Planning Services: CM Consult                                 Social Determinants of Health (SDOH) Interventions     Readmission Risk Interventions No flowsheet data found.

## 2018-09-22 NOTE — Progress Notes (Signed)
Pt confused and agitated. Son stating that she has not slept in three night. Home dose of Melatonin given and pt still not able to sleep. Received Haldol this morning for being combative towards son and staff. New iv placed. Mittens on hands so pt is not able to remove iv again. Pt surgical dressing dry and intact. Voiding without difficulty.

## 2018-09-22 NOTE — Progress Notes (Signed)
  Subjective:  POD #2 s/p IM rod for left intertrochanteric hip fracture.   Patient reports left hip pain as mild.  Patient remains confused and continues to try to get out of bed requiring a sitter.  Objective:   VITALS:   Vitals:   09/21/18 0729 09/21/18 1130 09/21/18 2313 09/22/18 0756  BP: 105/61 (!) 97/49 133/62 (!) 152/72  Pulse: (!) 57 68 72 73  Resp:   17 17  Temp: 97.7 F (36.5 C) 97.9 F (36.6 C) 98.1 F (36.7 C) 98.3 F (36.8 C)  TempSrc: Oral Oral Oral   SpO2: 100% 98% 97% 100%  Weight:        PHYSICAL EXAM: Left lower extremity: Neurovascular intact Sensation intact distally Intact pulses distally Dorsiflexion/Plantar flexion intact Incision: dressing C/D/I No cellulitis present Compartment soft  LABS  No results found for this or any previous visit (from the past 24 hour(s)).  Dg C-arm 1-60 Min  Result Date: 09/20/2018 CLINICAL DATA:  Femur fracture EXAM: LEFT FEMUR 2 VIEWS; DG C-ARM 61-120 MIN COMPARISON:  09/19/2018 FINDINGS: Twenty-one low resolution intraoperative spot views of the left femur. Total fluoroscopy time was 53 seconds. The images demonstrate a comminuted left intertrochanteric fracture with reduction of fracture fragments. Subsequent intramedullary rod and distal screw fixation of the left femur. IMPRESSION: Intraoperative fluoroscopic assistance provided during surgical fixation of left femur intertrochanteric fracture. Electronically Signed   By: Donavan Foil M.D.   On: 09/20/2018 19:21   Dg Femur Min 2 Views Left  Result Date: 09/20/2018 CLINICAL DATA:  Femur fracture EXAM: LEFT FEMUR 2 VIEWS; DG C-ARM 61-120 MIN COMPARISON:  09/19/2018 FINDINGS: Twenty-one low resolution intraoperative spot views of the left femur. Total fluoroscopy time was 53 seconds. The images demonstrate a comminuted left intertrochanteric fracture with reduction of fracture fragments. Subsequent intramedullary rod and distal screw fixation of the left femur.  IMPRESSION: Intraoperative fluoroscopic assistance provided during surgical fixation of left femur intertrochanteric fracture. Electronically Signed   By: Donavan Foil M.D.   On: 09/20/2018 19:21   Dg Femur Port Min 2 Views Left  Result Date: 09/20/2018 CLINICAL DATA:  Postop hip fracture EXAM: LEFT FEMUR PORTABLE 2 VIEWS COMPARISON:  09/19/2018 FINDINGS: Interval intramedullary rod and distal screw fixation of the left femur for intertrochanteric fracture. Gas in the soft tissues consistent with recent surgery. Intact hardware and anatomic alignment. IMPRESSION: Interval intramedullary rod and screw fixation of left femur for intertrochanteric fracture with expected postsurgical change Electronically Signed   By: Donavan Foil M.D.   On: 09/20/2018 19:55    Assessment/Plan: 2 Days Post-Op   Active Problems:   Closed left hip fracture (HCC)  Patient's confusion remains the biggest issue postoperatively.  Her hemoglobin hematocrit remained stable.  Vital signs are stable.  Patient is on Lovenox for DVT prophylaxis.  The family intends to take the patient home with home services upon discharge.  Continue physical therapy as appropriate.    Thornton Park , MD 09/22/2018, 3:03 PM

## 2018-09-22 NOTE — Anesthesia Postprocedure Evaluation (Signed)
Anesthesia Post Note  Patient: Derrill Center  Procedure(s) Performed: INTRAMEDULLARY (IM) NAIL INTERTROCHANTRIC (Left )  Patient location during evaluation: PACU Anesthesia Type: Spinal Level of consciousness: oriented and awake and alert Pain management: pain level controlled Vital Signs Assessment: post-procedure vital signs reviewed and stable Respiratory status: spontaneous breathing, respiratory function stable and patient connected to nasal cannula oxygen Cardiovascular status: blood pressure returned to baseline and stable Postop Assessment: no headache, no backache and no apparent nausea or vomiting Anesthetic complications: no     Last Vitals:  Vitals:   09/21/18 2313 09/22/18 0756  BP: 133/62 (!) 152/72  Pulse: 72 73  Resp: 17 17  Temp: 36.7 C 36.8 C  SpO2: 97% 100%    Last Pain:  Vitals:   09/22/18 0946  TempSrc:   PainSc: Colbert S

## 2018-09-22 NOTE — Progress Notes (Signed)
Patient confused, in lowbed, call bell in reach, bed in lowest position. Son went home earlier today. Patient pulled off dressings on left hip, dressings replaced.

## 2018-09-22 NOTE — Progress Notes (Signed)
Patient's husband is at bedside. Patient is adamant about going home, confused, and doesn't know what's going on. Husband educated on how to call for help if needed. Lowbed in lowest position, call bell in reach, bed alarm on.

## 2018-09-22 NOTE — Progress Notes (Signed)
Physical Therapy Treatment Patient Details Name: Kaylee Robinson Shi MRN: [id number] DOB: 09/05/1945 Today's Date: 09/22/2018    History of Present Illness Kaylee Robinson Calvi is a 72yoF who sustained a fall at home tripping over her deaf dog [place], subsequent Left hip fracture, now s/p Left femur ORIF 09/20/18. Pt typically is fully independent, no falls history, no assistive device for mobility.    PT Comments    Confusion remains.  Pt unable to recall injury even after multiple times re-orientating her and is not aware she is in a hospital.  "I need to go to the hospital" is stated frequently.  "I don't know what's wrong with me."   She states she needs to use to bathroom upon arrival.  To edge of bed with mod/max x 1.  Once sitting can sit unsupported but needs hand on assist for safety due to balance and confusion.  She is able to stand pivot to bedside commode with mod a x 1.  Ind self care in sitting.  She then stands with mod a x 1 and is able to remain standing for commode to be removed and recliner to be brought behind her by RN in room.    Confusion and pain remain primary barriers to improved mobility.   Follow Up Recommendations  SNF;Supervision/Assistance - 24 hour;Other (comment)     Equipment Recommendations  Wheelchair (measurements PT);Wheelchair cushion (measurements PT);Other (comment);Rolling walker with 5" wheels;3in1 (PT)    Recommendations for Other Services       Precautions / Restrictions Precautions Precautions: Fall Restrictions Weight Bearing Restrictions: Yes LLE Weight Bearing: Weight bearing as tolerated    Mobility  Bed Mobility Overal bed mobility: Needs Assistance Bed Mobility: Supine to Sit     Supine to sit: Mod assist;Max assist     General bed mobility comments: anxious, painful, cooperative  Transfers Overall transfer level: Needs assistance Equipment used: 1 person hand held assist Transfers: Sit to/from [employer]Stand;Stand Pivot Transfers Sit to  Stand: Mod assist Stand pivot transfers: Mod assist          Ambulation/Gait             General Gait Details: unable to walk   Stairs             Wheelchair Mobility    Modified Rankin (Stroke Patients Only)       Balance Overall balance assessment: Needs assistance Sitting-balance support: Single extremity supported;Feet supported Sitting balance-Leahy Scale: Fair     Standing balance support: During functional activity;Bilateral upper extremity supported Standing balance-Leahy Scale: Poor Standing balance comment: requires significant assist for upright mobility                            Cognition Arousal/Alertness: Awake/alert Behavior During Therapy: WFL for tasks assessed/performed Overall Cognitive Status: Impaired/Different from baseline Area of Impairment: Memory;Orientation                 Orientation Level: Disoriented to;Time;Situation   Memory: Decreased recall of precautions;Decreased short-term memory                Exercises Other Exercises Other Exercises: to commode to void    General Comments        Pertinent Vitals/Pain Pain Assessment: Faces Faces Pain Scale: Hurts whole lot Pain Location: Left hip Pain Descriptors / Indicators: Sore;Operative site guarding Pain Intervention(s): Monitored during session;RN gave pain meds during session;Limited activity within patient's tolerance;Repositioned    Home Living  Prior Function            PT Goals (current goals can now be found in the care plan section) Progress towards PT goals: Progressing toward goals    Frequency    BID      PT Plan Current plan remains appropriate    Co-evaluation              AM-PAC PT "6 Clicks" Mobility   Outcome Measure  Help needed turning from your back to your side while in a flat bed without using bedrails?: Total Help needed moving from lying on your back to sitting on  the side of a flat bed without using bedrails?: Total Help needed moving to and from a bed to a chair (including a wheelchair)?: A Lot Help needed standing up from a chair using your arms (e.g., wheelchair or bedside chair)?: A Lot Help needed to walk in hospital room?: Total Help needed climbing 3-5 steps with a railing? : Total 6 Click Score: 8    End of Session Equipment Utilized During Treatment: Gait belt Activity Tolerance: Patient tolerated treatment well;Patient limited by pain Patient left: in chair;with call bell/phone within reach;with chair alarm set Nurse Communication: Mobility status       Time: 9532-0233 PT Time Calculation (min) (ACUTE ONLY): 19 min  Charges:  $Therapeutic Activity: 8-22 mins                     [name], PTA 09/22/18, 9:23 AM

## 2018-09-22 NOTE — Progress Notes (Addendum)
[facility] NAME: Kaylee Robinson    MR#:  [id number]  DATE OF BIRTH:  1945-11-22  SUBJECTIVE:  CHIEF COMPLAINT:   Chief Complaint  Patient presents with  . Fall  . Fever   Patient is seen at the bedside. Son at the bedside. Patient has hx of dementia so subjective hx obtained for family. Son state she is having difficulties sleeping and that is probably why "she is agitated and more confused". Received Haldol this morning.  REVIEW OF SYSTEMS:  Review of Systems  Unable to perform ROS: Dementia   DRUG ALLERGIES:   Allergies  Allergen Reactions  . Enalapril Other (See Comments)  . Naproxen Swelling  . Amoxicillin Rash  . Sulfa Antibiotics Rash    VITALS:  Blood pressure (!) 152/72, pulse 73, temperature 98.3 F (36.8 C), resp. rate 17, weight 58.6 kg, SpO2 100 %.  PHYSICAL EXAMINATION:  Physical Exam   GENERAL:  73 y.o.-year-old elderly patient lying in the bed with no acute distress.  EYES: Pupils equal, round, reactive to light and accommodation. No scleral icterus. Extraocular muscles intact.  HEENT: Head atraumatic, normocephalic. Oropharynx and nasopharynx clear.  NECK:  Supple, no jugular venous distention. No thyroid enlargement, no tenderness.  LUNGS: Normal breath sounds bilaterally, no wheezing, rales,rhonchi or crepitation. No use of accessory muscles of respiration.  Decreased bibasilar breath sounds CARDIOVASCULAR: S1, S2 normal. No  rubs, or gallops.  3/6 systolic murmur is present ABDOMEN: Soft, nontender, nondistended. Bowel sounds present. No organomegaly or mass.  EXTREMITIES: No pedal edema, cyanosis, or clubbing. Decreased ROM in the left Hip due to pain post op. Wound dry and clean, no significant swelling or erythema. NEUROLOGIC: Alert and remains confused and disoriented to place, time and situation. Requires multiple prompting and re-orientation. Cranial nerves II through XII are intact. Unable to  assess muscle strength due to difficulty following commands. Gait not checked.  PSYCHIATRIC: The patient is alert and oriented to self.  SKIN: No obvious rash, lesion, or ulcer.   LABORATORY PANEL:   CBC Recent Labs  Lab 09/21/18 0629  WBC 9.4  HGB 12.4  HCT 37.7  PLT 230   ------------------------------------------------------------------------------------------------------------------  Chemistries  Recent Labs  Lab 09/19/18 2206  09/21/18 0629  NA 133*   < > 134*  K 3.9   < > 4.6  CL 99   < > 101  CO2 23   < > 24  GLUCOSE 101*   < > 126*  BUN 12   < > 16  CREATININE 0.80   < > 0.62  CALCIUM 8.6*   < > 8.3*  AST 19  --   --   ALT 12  --   --   ALKPHOS 103  --   --   BILITOT 0.9  --   --    < > = values in this interval not displayed.   ------------------------------------------------------------------------------------------------------------------  Cardiac Enzymes No results for input(s): TROPONINI in the last 168 hours. ------------------------------------------------------------------------------------------------------------------  RADIOLOGY:  Dg C-arm 1-60 Min  Result Date: 09/20/2018 CLINICAL DATA:  Femur fracture EXAM: LEFT FEMUR 2 VIEWS; DG C-ARM 61-120 MIN COMPARISON:  09/19/2018 FINDINGS: Twenty-one low resolution intraoperative spot views of the left femur. Total fluoroscopy time was 53 seconds. The images demonstrate a comminuted left intertrochanteric fracture with reduction of fracture fragments. Subsequent intramedullary rod and distal screw fixation of the left femur. IMPRESSION: Intraoperative fluoroscopic assistance provided during surgical fixation of left femur  intertrochanteric fracture. Electronically Signed   By: Jasmine PangKim  Fujinaga M.D.   On: 09/20/2018 19:21   Dg Femur Min 2 Views Left  Result Date: 09/20/2018 CLINICAL DATA:  Femur fracture EXAM: LEFT FEMUR 2 VIEWS; DG C-ARM 61-120 MIN COMPARISON:  09/19/2018 FINDINGS: Twenty-one low resolution  intraoperative spot views of the left femur. Total fluoroscopy time was 53 seconds. The images demonstrate a comminuted left intertrochanteric fracture with reduction of fracture fragments. Subsequent intramedullary rod and distal screw fixation of the left femur. IMPRESSION: Intraoperative fluoroscopic assistance provided during surgical fixation of left femur intertrochanteric fracture. Electronically Signed   By: Jasmine PangKim  Fujinaga M.D.   On: 09/20/2018 19:21   Dg Femur Port Min 2 Views Left  Result Date: 09/20/2018 CLINICAL DATA:  Postop hip fracture EXAM: LEFT FEMUR PORTABLE 2 VIEWS COMPARISON:  09/19/2018 FINDINGS: Interval intramedullary rod and distal screw fixation of the left femur for intertrochanteric fracture. Gas in the soft tissues consistent with recent surgery. Intact hardware and anatomic alignment. IMPRESSION: Interval intramedullary rod and screw fixation of left femur for intertrochanteric fracture with expected postsurgical change Electronically Signed   By: Jasmine PangKim  Fujinaga M.D.   On: 09/20/2018 19:55   EKG:   Orders placed or performed during the hospital encounter of 09/19/18  . ED EKG  . ED EKG  . EKG 12-Lead  . EKG 12-Lead   ASSESSMENT AND PLAN:   73 year old female with past medical history significant for dementia, arthritis presents from home after a fall and left hip fracture.  1. Left intertrochanteric hip fracture - s/p intermedullary fixation of left intertrochanteric hip fx POD # 2 - prn pain management - PT/OT following, recommends SNF at discharge - Ortho input appreciated  2. Moderate Alzheimer's Dementia- With behavioral distubances - Continue Aricept, Namenda - Continue Melatonin at bedtime to help with sleep as she is at risk for delirium  3. Depression - Continue Zoloft  4. Tobacco use disorder-started on nicotine patch  5. DVT prophylaxis- Lovenox   All the records are reviewed and case discussed with Care Management/Social Workerr. Management  plans discussed with the patient, family and they are in agreement.  CODE STATUS: Full code  TOTAL TIME TAKING CARE OF THIS PATIENT: 39 minutes.   POSSIBLE D/C IN 2-3 DAYS, DEPENDING ON CLINICAL CONDITION. Webb SilversmithElizabeth Ouma, DNP, FNP-BC [facility] Nurse Practitioner Between 7am to 6pm - Pager - [phone]  After 6pm go to [email or web address] - Social research officer, governmentpassword EPAS [facility]  [facility]  Office  [phone]  CC: Primary care physician; Leotis ShamesSingh, Jasmine, MD

## 2018-09-23 ENCOUNTER — Encounter: Payer: Self-pay | Admitting: Orthopedic Surgery

## 2018-09-23 LAB — BASIC METABOLIC PANEL
Anion gap: 11 (ref 5–15)
BUN: 10 mg/dL (ref 8–23)
CO2: 25 mmol/L (ref 22–32)
Calcium: 8.5 mg/dL — ABNORMAL LOW (ref 8.9–10.3)
Chloride: 104 mmol/L (ref 98–111)
Creatinine, Ser: 0.56 mg/dL (ref 0.44–1.00)
GFR calc Af Amer: >60 mL/min (ref >60)
GFR calc non Af Amer: >60 mL/min (ref >60)
Glucose, Bld: 95 mg/dL (ref 70–99)
Potassium: 3.4 mmol/L — ABNORMAL LOW (ref 3.5–5.1)
Sodium: 140 mmol/L (ref 135–145)

## 2018-09-23 LAB — SARS CORONAVIRUS 2 BY RT PCR (HOSPITAL ORDER, PERFORMED IN ~~LOC~~ HOSPITAL LAB): SARS Coronavirus 2: NEGATIVE

## 2018-09-23 MED ORDER — ALUM & MAG HYDROXIDE-SIMETH 200-200-20 MG/5ML PO SUSP: 30.0000 mL | ORAL | 0 refills | Status: DC | PRN

## 2018-09-23 MED ORDER — METHOCARBAMOL 500 MG PO TABS: 500.0000 mg | ORAL_TABLET | Freq: Four times a day (QID) | ORAL | 0 refills | Status: DC | PRN

## 2018-09-23 MED ORDER — OLANZAPINE 2.5 MG PO TABS: 2.5000 mg | ORAL_TABLET | Freq: Two times a day (BID) | ORAL | 0 refills | Status: DC | PRN

## 2018-09-23 MED ORDER — BISACODYL 10 MG RE SUPP: 10.0000 mg | Freq: Every day | RECTAL | 0 refills | Status: DC | PRN

## 2018-09-23 MED ORDER — FERROUS SULFATE 325 (65 FE) MG PO TABS: 325.0000 mg | ORAL_TABLET | Freq: Three times a day (TID) | ORAL | 0 refills | Status: DC

## 2018-09-23 MED ORDER — HYDROCODONE-ACETAMINOPHEN 7.5-325 MG PO TABS: 1.0000 | ORAL_TABLET | ORAL | 0 refills | Status: DC | PRN

## 2018-09-23 MED ORDER — MELATONIN 5 MG PO TABS: 5.0000 mg | ORAL_TABLET | Freq: Every evening | ORAL | 0 refills | Status: DC | PRN

## 2018-09-23 MED ORDER — POLYETHYLENE GLYCOL 3350 17 G PO PACK: 17.0000 g | PACK | Freq: Every day | ORAL | 0 refills | Status: DC | PRN

## 2018-09-23 MED ORDER — DOCUSATE SODIUM 100 MG PO CAPS: 100.0000 mg | ORAL_CAPSULE | Freq: Two times a day (BID) | ORAL | 0 refills | Status: DC

## 2018-09-23 NOTE — Progress Notes (Signed)
Called and gave report to Butch Penny at [facility]. Called for EMS transport. Called pt's husband and left him a voice message as to when/where she is going.

## 2018-09-23 NOTE — Progress Notes (Signed)
Physical Therapy Treatment Patient Details Name: Kaylee Robinson MRN: [id number] DOB: 11/14/1945 Today's Date: 09/23/2018    History of Present Illness Kaylee Robinson is a 72yoF who sustained a fall at home tripping over her deaf dog [place], subsequent Left hip fracture, now s/p Left femur ORIF 09/20/18. Pt typically is fully independent, no falls history, no assistive device for mobility.    PT Comments    Pt sleeping; awoken through voice. Agreeable to PT; remains confused to date, place and situation and repetitively asks "when did I get here", "what did I do to my leg". Pt requires heavy cueing for all tasks for technique and to maintain focus on task. Pt demonstrating increased time and assist for all tasks. Min A for supine trunk/LE movement toward edge of bed and Mod A for supine to sit/scooting to edge of bed in sit. Min A from elevated surface to stand with rolling walker and Min/Mod A for short ambulation (22 ft) around bed to chair. Pt does display mild L knee buckling with ambulation, but can follow cues to QS to avoid buckle. No carryover with technique requiring consistent cues. Pt follows cues for all exercises and performs with assist as needed. Pt receives up in chair comfortably and set up for breakfast. Spoke with SW regarding session; also spoke with son Kathlene November(Mike) via phone per SW request. Continue to recommend discharge to skilled nursing facility to allow for improved strength, stability/balance in stand, education on AD use and functional mobility safety before returning home. Per SW pt to discharge to [facility] today.   Follow Up Recommendations  SNF;Supervision/Assistance - 24 hour;Other (comment)     Equipment Recommendations  Other (comment)(TBD at next venue)    Recommendations for Other Services       Precautions / Restrictions Precautions Precautions: Fall Restrictions Weight Bearing Restrictions: Yes LLE Weight Bearing: Weight bearing as tolerated    Mobility  Bed  Mobility Overal bed mobility: Needs Assistance Bed Mobility: Supine to Sit     Supine to sit: Min assist;Mod assist     General bed mobility comments: Heavy verbal/tactile cues for movement and to stay focused/on task. Min A for hip/LE shift to edge of bed in supine. Mod A for trunk elevation and scooting to edge of bed in sit.   Transfers Overall transfer level: Needs assistance Equipment used: Rolling walker (2 wheeled) Transfers: Sit to/from Stand Sit to Stand: Min assist;From elevated surface         General transfer comment: cues for hand placement. Min A to elevate/steady. Min assisted gentle weight shift prior to ambulation attempt  Ambulation/Gait Ambulation/Gait assistance: Min assist;Mod assist Gait Distance (Feet): 22 Feet Assistive device: Rolling walker (2 wheeled) Gait Pattern/deviations: Step-to pattern;Decreased step length - right;Decreased step length - left;Decreased stance time - left;Decreased dorsiflexion - right;Decreased dorsiflexion - left;Decreased weight shift to left;Antalgic(mild L knee buckling)     General Gait Details: Short steps in 3 point pattern with slower L step and decreased wt shift to L with mild knee buckling intermittently and quick R step. Pt follows cues for L knee QS with weight bearing; however, no memory carryover with continued stepping requiring consistent verbal/tactile reminders. Alternates between Min and Mod A during ambulation to steady and heavy cues for chair approach this session.    Stairs             Wheelchair Mobility    Modified Rankin (Stroke Patients Only)       Balance Overall balance assessment: Needs assistance  Sitting-balance support: Bilateral upper extremity supported;Feet supported Sitting balance-Leahy Scale: Fair Sitting balance - Comments: some difficulty remaining RLE on floor; improves with increased sit time and with exercises edge of bed   Standing balance support: Bilateral upper  extremity supported Standing balance-Leahy Scale: Poor                              Cognition Arousal/Alertness: Awake/alert Behavior During Therapy: WFL for tasks assessed/performed Overall Cognitive Status: Impaired/Different from baseline Area of Impairment: Memory;Orientation                 Orientation Level: Disoriented to;Time;Situation   Memory: Decreased recall of precautions;Decreased short-term memory                Exercises General Exercises - Lower Extremity Ankle Circles/Pumps: AROM;Both;20 reps Quad Sets: Strengthening;Both;20 reps;Other (comment)(also in stand with wt shift 10x) Gluteal Sets: Strengthening;Both;10 reps Long Arc Quad: AROM;AAROM;Left;10 reps;Other (comment)(2 sets; AROM R) Hip ABduction/ADduction: AAROM;20 reps;Left;Supine(seated on R 10x) Straight Leg Raises: AAROM;Both;10 reps;Other (comment)(2 sets) Hip Flexion/Marching: AROM;Both;10 reps;Other (comment)(2 sets)    General Comments        Pertinent Vitals/Pain Pain Assessment: Faces Faces Pain Scale: Hurts little more Pain Location: L knee Pain Descriptors / Indicators: Grimacing;Discomfort;Sore(with weightbearing and movement)    Home Living                      Prior Function            PT Goals (current goals can now be found in the care plan section) Progress towards PT goals: Progressing toward goals    Frequency    BID      PT Plan Current plan remains appropriate    Co-evaluation              AM-PAC PT "6 Clicks" Mobility   Outcome Measure  Help needed turning from your back to your side while in a flat bed without using bedrails?: A Lot Help needed moving from lying on your back to sitting on the side of a flat bed without using bedrails?: A Lot Help needed moving to and from a bed to a chair (including a wheelchair)?: A Lot Help needed standing up from a chair using your arms (e.g., wheelchair or bedside chair)?: A  Lot Help needed to walk in hospital room?: A Lot Help needed climbing 3-5 steps with a railing? : Total 6 Click Score: 11    End of Session Equipment Utilized During Treatment: Gait belt Activity Tolerance: Patient tolerated treatment well Patient left: in chair;with call bell/phone within reach;with chair alarm set Nurse Communication: Other (comment)(SW and son regarding session) PT Visit Diagnosis: Unsteadiness on feet (R26.81);Difficulty in walking, not elsewhere classified (R26.2);Muscle weakness (generalized) (M62.81)     Time: 3335-4562 PT Time Calculation (min) (ACUTE ONLY): 48 min  Charges:  $Gait Training: 8-22 mins $Therapeutic Exercise: 8-22 mins $Therapeutic Activity: 8-22 mins                      Larae Grooms, PTA 09/23/2018, 11:08 AM

## 2018-09-23 NOTE — TOC Progression Note (Signed)
Transition of Care [facility]) - Progression Note    Patient Details  Name: Kaylee Robinson MRN: [id number] Date of Birth: March 20, 1946  [facility] CM/SW Contact  Kenijah Benningfield, Lenice Llamas Phone Number: [phone]  09/23/2018, 9:02 AM  Clinical Narrative: Clinical Social Worker (CSW) contacted patient's son Eddie Dibbles and made him aware that per hospitalist NP patient is stable for D/C today. Per son he would like to see how she does with PT today before making a decision about going to SNF or bringing her home with home health. CSW explained that [facility] requires authorization before going to SNF which can take come time. Son reported that if patient goes to SNF he wants [facility] and is agreeable for [facility] to start [facility] SN[facility] authorization today. [facility] liaison is aware of above and will start [facility] SNF authorization today. CSW will call son back after PT session today.     Expected Discharge Plan: [place] Barriers to Discharge: Continued Medical Work up  Expected Discharge Plan and Services Expected Discharge Plan: [place] In-house Referral: Clinical Social Work Discharge Planning Services: CM Consult   Living arrangements for the past 2 months: Single Family Home                                       Social Determinants of Health (SDOH) Interventions    Readmission Risk Interventions No flowsheet data found.

## 2018-09-23 NOTE — TOC Transition Note (Signed)
Transition of Care [facility]) - CM/SW Discharge Note   Patient Details  Name: Kaylee Robinson MRN: [id number] Date of Birth: 06/03/1945  Transition of Care [facility] CM/SW Contact:  Leotha Westermeyer, Lenice Llamas Phone Number: [phone]  09/23/2018, 2:00 PM   Clinical Narrative: PT recommended SNF today and PT spoke to patient's son Eddie Dibbles via telephone. Son is in agreement with patient going to [facility] today. Patient is medically stable for D/C to [facility] today. Per Otila Kluver [facility] liaison [facility] SNF authorization has been received and patient had a negative covid test today 7/6. Per Otila Kluver patient can come to [facility] today to room 708. RN will call report and arrange EMS for transport. Clinical Education officer, museum (CSW) sent D/C orders to [facility] via [facility]. Patient's son Eddie Dibbles is aware of above. Please reconsult if future social work needs arise. CSW signing off.     Final next level of care: Skilled Nursing Facility Barriers to Discharge: No Barriers Identified   Patient Goals and CMS Choice Patient states their goals for this hospitalization and ongoing recovery are:: Pain control   Choice offered to / list presented to : Adult Children  Discharge Placement PASRR number recieved: 09/20/18            Patient [facility] bed at: [facility] [place] Patient to be transferred to facility by: [facility] Name of family member notified: Patient's son Eddie Dibbles is aware of D/C today. Patient and family notified of of transfer: 09/23/18  Discharge Plan and Services In-house Referral: Clinical Social Work Discharge Planning Services: CM Consult                                 Social Determinants of Health (SDOH) Interventions     Readmission Risk Interventions No flowsheet data found.

## 2018-09-23 NOTE — Discharge Summary (Signed)
[facility] [place] at [facility]   PATIENT NAME: Kaylee KaysLavern Robinson    MR#:  [id number]  DATE OF BIRTH:  06/09/1945  DATE OF ADMISSION:  09/19/2018   ADMITTING PHYSICIAN: Hannah BeatJan A Mansy, MD  DATE OF DISCHARGE:09/23/18  PRIMARY CARE PHYSICIAN: Leotis ShamesSingh, Jasmine, MD   ADMISSION DIAGNOSIS:   Fall [W19.XXXA] Closed fracture of left hip, initial encounter (HCC) [S72.002A] Fall, initial encounter [W19.XXXA] Fever, unspecified fever cause [R50.9]  DISCHARGE DIAGNOSIS:   Active Problems:   Closed left hip fracture (HCC)  SECONDARY DIAGNOSIS:   Past Medical History:  Diagnosis Date  . Arthritis    bilateral knees    HOSPITAL COURSE:  73 year old female with past medical history significant for Alzheimer's dementia, hypertension, GERD, mild COPD and varicose veins and Athritis presents from home with left hip pain following a mechanical fall.  1. Left intertrochanterichip fracture - s/p intermedullary fixation of left intertrochanteric hip fx POD # 3 - Continue prn pain management - PT/OT eval during this admission and SNF recommended for continued Rehab - Will discharge to [facility] for Rehab - Follow with ortho as outpatient in about 10 days  2. Moderate Alzheimer's Dementia- With behavioral distubances - Continue Aricept, Namenda - Continue Melatonin at bedtime to help with sleep as she is at risk for delirium - Zyprexa as needed for agitation  3. Depression - Continue Zoloft  4. Tobacco use disorder- Smoking cessation provided  DISCHARGE CONDITIONS:   Stable. Confused at baseline CONSULTS OBTAINED:   Orthopaedic: Loletta SpecterKransinski, Kevin MD  DRUG ALLERGIES:   Allergies  Allergen Reactions  . Enalapril Other (See Comments)  . Naproxen Swelling  . Amoxicillin Rash  . Sulfa Antibiotics Rash   DISCHARGE MEDICATIONS:   Allergies as of 09/23/2018      Reactions   Enalapril Other (See Comments)   Naproxen Swelling   Amoxicillin Rash   Sulfa  Antibiotics Rash      Medication List    STOP taking these medications   acetaminophen 500 MG tablet Commonly known as: TYLENOL   traMADol 50 MG tablet Commonly known as: ULTRAM     TAKE these medications   albuterol 108 (90 Base) MCG/ACT inhaler Commonly known as: VENTOLIN HFA Inhale 2 puffs into the lungs every 6 (six) hours as needed for wheezing or shortness of breath.   alum & mag hydroxide-simeth 200-200-20 MG/5ML suspension Commonly known as: MAALOX/MYLANTA Take 30 mLs by mouth every 4 (four) hours as needed for indigestion.   bisacodyl 10 MG suppository Commonly known as: DULCOLAX Place 1 suppository (10 mg total) rectally daily as needed for moderate constipation.   cetirizine 10 MG tablet Commonly known as: ZYRTEC Take 10 mg by mouth daily.   docusate sodium 100 MG capsule Commonly known as: COLACE Take 1 capsule (100 mg total) by mouth 2 (two) times daily.   donepezil 5 MG tablet Commonly known as: ARICEPT Take 5 mg by mouth at bedtime.   ferrous sulfate 325 (65 FE) MG tablet Take 1 tablet (325 mg total) by mouth 3 (three) times daily after meals.   fluticasone 50 MCG/ACT nasal spray Commonly known as: FLONASE USE 2 SPRAY(S) IN EACH NOSTRIL ONCE DAILY   HYDROcodone-acetaminophen 7.5-325 MG tablet Commonly known as: NORCO Take 1 tablet by mouth every 4 (four) hours as needed for severe pain (pain score 7-10).   Melatonin 5 MG Tabs Take 1 tablet (5 mg total) by mouth at bedtime as needed (sleep).   memantine 5 MG tablet Commonly  known as: NAMENDA Take 5 mg by mouth 2 (two) times daily.   methocarbamol 500 MG tablet Commonly known as: ROBAXIN Take 1 tablet (500 mg total) by mouth every 6 (six) hours as needed for muscle spasms.   OLANZapine 2.5 MG tablet Commonly known as: ZYPREXA Take 1 tablet (2.5 mg total) by mouth 2 (two) times daily as needed (agitation).   polyethylene glycol 17 g packet Commonly known as: MIRALAX / GLYCOLAX Take 17 g by  mouth daily as needed for mild constipation.   sertraline 50 MG tablet Commonly known as: ZOLOFT Take 50 mg by mouth daily.       DISCHARGE INSTRUCTIONS:    DIET:   Regular diet  ACTIVITY:   Per PT recommendations  OXYGEN:   Home Oxygen: No.  Oxygen Delivery: room air  DISCHARGE LOCATION:   SNF   If you experience worsening of your admission symptoms, develop shortness of breath, life threatening emergency, suicidal or homicidal thoughts you must seek medical attention immediately by calling 911 or calling your MD immediately  if symptoms less severe.  You Must read complete instructions/literature along with all the possible adverse reactions/side effects for all the Medicines you take and that have been prescribed to you. Take any new Medicines after you have completely understood and accpet all the possible adverse reactions/side effects.   Please note  You were cared for by a hospitalist during your hospital stay. If you have any questions about your discharge medications or the care you received while you were in the hospital after you are discharged, you can call the unit and asked to speak with the hospitalist on call if the hospitalist that took care of you is not available. Once you are discharged, your primary care physician will handle any further medical issues. Please note that NO REFILLS for any discharge medications will be authorized once you are discharged, as it is imperative that you return to your primary care physician (or establish a relationship with a primary care physician if you do not have one) for your aftercare needs so that they can reassess your need for medications and monitor your lab values.  On the day of Discharge:  VITAL SIGNS:   Blood pressure (!) 147/78, pulse 75, temperature 98.3 F (36.8 C), resp. rate 16, weight 58.6 kg, SpO2 99 %.  PHYSICAL EXAMINATION:    GENERAL:  73 y.o.-year-old patient lying in the bed with no acute  distress.  EYES: Pupils equal, round, reactive to light and accommodation. No scleral icterus. Extraocular muscles intact.  HEENT: Head atraumatic, normocephalic. Oropharynx and nasopharynx clear.  NECK:  Supple, no jugular venous distention. No thyroid enlargement, no tenderness.  LUNGS: Normal breath sounds bilaterally, no wheezing, rales,rhonchi or crepitation. No use of accessory muscles of respiration.  CARDIOVASCULAR: S1, S2 normal. No murmurs, rubs, or gallops.  ABDOMEN: Soft, non-tender, non-distended. Bowel sounds present. No organomegaly or mass.  EXTREMITIES: No pedal edema, cyanosis, or clubbing.  NEUROLOGIC: Cranial nerves II through XII are intact. Moves all extremities against gravity. Sensation intact. Gait not checked.  PSYCHIATRIC: The patient is alert and oriented x 1.  SKIN: No obvious rash, lesion, or ulcer.   DATA REVIEW:   CBC Recent Labs  Lab 09/21/18 0629  WBC 9.4  HGB 12.4  HCT 37.7  PLT 230    Chemistries  Recent Labs  Lab 09/19/18 2206  09/23/18 0517  NA 133*   < > 140  K 3.9   < > 3.4*  CL 99   < > 104  CO2 23   < > 25  GLUCOSE 101*   < > 95  BUN 12   < > 10  CREATININE 0.80   < > 0.56  CALCIUM 8.6*   < > 8.5*  AST 19  --   --   ALT 12  --   --   ALKPHOS 103  --   --   BILITOT 0.9  --   --    < > = values in this interval not displayed.     Microbiology Results  Results for orders placed or performed during the hospital encounter of 09/19/18  SARS Coronavirus 2 (CEPHEID - Performed in [facility] hospital lab), Hosp Order     Status: None   Collection Time: 09/19/18 10:06 PM   Specimen: Nasopharyngeal Swab  Result Value Ref Range Status   SARS Coronavirus 2 NEGATIVE NEGATIVE Final    Comment: (NOTE) If result is NEGATIVE SARS-CoV-2 target nucleic acids are NOT DETECTED. The SARS-CoV-2 RNA is generally detectable in upper and lower  respiratory specimens during the acute phase of infection. The lowest  concentration of SARS-CoV-2  viral copies this assay can detect is 250  copies / mL. A negative result does not preclude SARS-CoV-2 infection  and should not be used as the sole basis for treatment or other  patient management decisions.  A negative result may occur with  improper specimen collection / handling, submission of specimen other  than nasopharyngeal swab, presence of viral mutation(s) within the  areas targeted by this assay, and inadequate number of viral copies  (<250 copies / mL). A negative result must be combined with clinical  observations, patient history, and epidemiological information. If result is POSITIVE SARS-CoV-2 target nucleic acids are DETECTED. The SARS-CoV-2 RNA is generally detectable in upper and lower  respiratory specimens dur ing the acute phase of infection.  Positive  results are indicative of active infection with SARS-CoV-2.  Clinical  correlation with patient history and other diagnostic information is  necessary to determine patient infection status.  Positive results do  not rule out bacterial infection or co-infection with other viruses. If result is PRESUMPTIVE POSTIVE SARS-CoV-2 nucleic acids MAY BE PRESENT.   A presumptive positive result was obtained on the submitted specimen  and confirmed on repeat testing.  While 2019 novel coronavirus  (SARS-CoV-2) nucleic acids may be present in the submitted sample  additional confirmatory testing may be necessary for epidemiological  and / or clinical management purposes  to differentiate between  SARS-CoV-2 and other Sarbecovirus currently known to infect humans.  If clinically indicated additional testing with an alternate test  methodology [phone]([id number]) is advised. The SARS-CoV-2 RNA is generally  detectable in upper and lower respiratory sp ecimens during the acute  phase of infection. The expected result is Negative. Fact Sheet for Patients:  BoilerBrush.com.cyhttps://www.fda.gov/media/136312/download Fact Sheet for Healthcare  Providers: https://pope.com/https://www.fda.gov/media/136313/download This test is not yet approved or cleared by the [place] FDA and has been authorized for detection and/or diagnosis of SARS-CoV-2 by FDA under an Emergency Use Authorization (EUA).  This EUA will remain in effect (meaning this test can be used) for the duration of the COVID-19 declaration under Section 564(b)(1) of the Act, 21 U.S.C. section 360bbb-3(b)(1), unless the authorization is terminated or revoked sooner. Performed at [facility], [address] [place], [place] [address]   Urine culture     Status: None   Collection Time: 09/19/18 11:03 PM  Specimen: Urine, Random  Result Value Ref Range Status   Specimen Description   Final    URINE, RANDOM Performed at [facility], [address], [place], [place] [address]    Special Requests   Final    NONE Performed at [facility], [address] [place], [place] [address]    Culture   Final    NO GROWTH Performed at [facility], [place] [address] [place], [place] [address]    Report Status 09/21/2018 FINAL  Final  Blood culture (routine x 2)     Status: Abnormal   Collection Time: 09/19/18 11:03 PM   Specimen: BLOOD  Result Value Ref Range Status   Specimen Description   Final    BLOOD LEFT FATTY CASTS Performed at [facility], [address], [place], [place] [address]    Special Requests   Final    BOTTLES DRAWN AEROBIC AND ANAEROBIC Blood Culture adequate volume Performed at [facility], [address] [place], [place] [address]    Culture  Setup Time   Final    GRAM POSITIVE COCCI AEROBIC BOTTLE ONLY CRITICAL RESULT CALLED TO, READ BACK BY AND VERIFIED WITH: LISA KLUTZZ @1642  09/20/18 MJU    Culture (A)  Final    STAPHYLOCOCCUS SPECIES (COAGULASE NEGATIVE) THE SIGNIFICANCE OF ISOLATING THIS ORGANISM FROM A SINGLE SET OF BLOOD CULTURES WHEN MULTIPLE SETS ARE DRAWN IS UNCERTAIN. PLEASE NOTIFY THE  MICROBIOLOGY DEPARTMENT WITHIN ONE WEEK IF SPECIATION AND SENSITIVITIES ARE REQUIRED. Performed at [facility], [place] [address], [place], [place] [address]    Report Status 09/22/2018 FINAL  Final  Blood Culture ID Panel (Reflexed)     Status: Abnormal   Collection Time: 09/19/18 11:03 PM  Result Value Ref Range Status   Enterococcus species NOT DETECTED NOT DETECTED Final   Listeria monocytogenes NOT DETECTED NOT DETECTED Final   Staphylococcus species DETECTED (A) NOT DETECTED Final    Comment: Methicillin (oxacillin) susceptible coagulase negative staphylococcus. Possible blood culture contaminant (unless isolated from more than one blood culture draw or clinical case suggests pathogenicity). No antibiotic treatment is indicated for blood  culture contaminants. CRITICAL RESULT CALLED TO, READ BACK BY AND VERIFIED WITH: LISA KLUTTZ @1642  09/20/18 MJU    Staphylococcus aureus (BCID) NOT DETECTED NOT DETECTED Final   Methicillin resistance NOT DETECTED NOT DETECTED Final   Streptococcus species NOT DETECTED NOT DETECTED Final   Streptococcus agalactiae NOT DETECTED NOT DETECTED Final   Streptococcus pneumoniae NOT DETECTED NOT DETECTED Final   Streptococcus pyogenes NOT DETECTED NOT DETECTED Final   Acinetobacter baumannii NOT DETECTED NOT DETECTED Final   Enterobacteriaceae species NOT DETECTED NOT DETECTED Final   Enterobacter cloacae complex NOT DETECTED NOT DETECTED Final   Escherichia coli NOT DETECTED NOT DETECTED Final   Klebsiella oxytoca NOT DETECTED NOT DETECTED Final   Klebsiella pneumoniae NOT DETECTED NOT DETECTED Final   Proteus species NOT DETECTED NOT DETECTED Final   Serratia marcescens NOT DETECTED NOT DETECTED Final   Haemophilus influenzae NOT DETECTED NOT DETECTED Final   Neisseria meningitidis NOT DETECTED NOT DETECTED Final   Pseudomonas aeruginosa NOT DETECTED NOT DETECTED Final   Candida albicans NOT DETECTED NOT DETECTED Final   Candida glabrata NOT  DETECTED NOT DETECTED Final   Candida krusei NOT DETECTED NOT DETECTED Final   Candida parapsilosis NOT DETECTED NOT DETECTED Final   Candida tropicalis NOT DETECTED NOT DETECTED Final    Comment: Performed at [facility], [address] [place], [place] [address]  Blood culture (routine x 2)     Status: None (Preliminary result)   Collection Time: 09/20/18 12:46 AM   Specimen: BLOOD  Result Value Ref Range Status   Specimen Description BLOOD LEFT ANTECUBITAL  Final   Special Requests   Final    BOTTLES DRAWN AEROBIC AND ANAEROBIC Blood Culture adequate volume   Culture   Final    NO GROWTH 3 DAYS Performed at [facility], [address] [place], [place] [address]    Report Status PENDING  Incomplete  Surgical pcr screen     Status: None   Collection Time: 09/20/18  4:19 AM   Specimen: Nasal Mucosa; Nasal Swab  Result Value Ref Range Status   MRSA, PCR NEGATIVE NEGATIVE Final   Staphylococcus aureus NEGATIVE NEGATIVE Final    Comment: (NOTE) The Xpert SA Assay (FDA approved for NASAL specimens in patients 73 years of age and older), is one component of a comprehensive surveillance program. It is not intended to diagnose infection nor to guide or monitor treatment. Performed at [facility], [address] [place], [place] [address]     RADIOLOGY:  No results found.   Management plans discussed with the patient, family and they are in agreement.  CODE STATUS:     Code Status Orders  (From admission, onward)         Start     Ordered   09/20/18 0147  Full code  Continuous     09/20/18 0146        Code Status History    This patient has a current code status but no historical code status.   Advance Care Planning Activity      TOTAL TIME TAKING CARE OF THIS PATIENT: 35 minutes.   Webb SilversmithElizabeth Giordan [place], DNP, FNP-BC [facility] Nurse Practitioner Between 7am to 6pm - Pager - [phone]  After 6pm go to [email or web address] -  password [employer] [email or web address] [email or web address]  [facility]  Office  [phone]   CC: Primary care physician; Leotis ShamesSingh, Jasmine, MD  Note: This dictation was prepared with Dragon dictation along with smaller phrase technology. Any transcriptional errors that result from this process are unintentional.

## 2018-09-23 NOTE — Progress Notes (Signed)
EMS here picking up patient.

## 2018-09-23 NOTE — Care Management Important Message (Signed)
Important Message  Patient Details  Name: Kaylee Robinson MRN: [id number] Date of Birth: 01-22-1946   Medicare Important Message Given:  Yes     Juliann Pulse A Achol Azpeitia 09/23/2018, 12:02 PM

## 2018-09-23 NOTE — Progress Notes (Signed)
Pt husband at bedside. Pt fell asleep around 1 am with good quality sleep. Pt still very confused and impulsive. Surgical dressing dry and intact. Voiding without difficulty.

## 2018-09-23 NOTE — Progress Notes (Signed)
  Subjective:  POD #3 s/p intramedullary fixation for left intertrochanteric hip fracture.  Patient is up out of bed in a chair eating lunch and more alert today.   Patient reports left hip pain as mild.    Objective:   VITALS:   Vitals:   09/22/18 1624 09/22/18 2325 09/23/18 0812 09/23/18 0947  BP: (!) 150/77 (!) 154/83 (!) 147/78   Pulse: 81 80 76 75  Resp: 20 19 16    Temp: 98.7 F (37.1 C) 99.7 F (37.6 C) 98.3 F (36.8 C)   TempSrc:  Oral    SpO2: 100% 100% 100% 99%  Weight:        PHYSICAL EXAM: Left lower extremity Neurovascular intact Sensation intact distally Intact pulses distally Dorsiflexion/Plantar flexion intact Incision: dressing C/D/I No cellulitis present Compartment soft  LABS  Results for orders placed or performed during the hospital encounter of 09/19/18 (from the past 24 hour(s))  Basic metabolic panel     Status: Abnormal   Collection Time: 09/23/18  5:17 AM  Result Value Ref Range   Sodium 140 135 - 145 mmol/L   Potassium 3.4 (L) 3.5 - 5.1 mmol/L   Chloride 104 98 - 111 mmol/L   CO2 25 22 - 32 mmol/L   Glucose, Bld 95 70 - 99 mg/dL   BUN 10 8 - 23 mg/dL   Creatinine, Ser 0.56 0.44 - 1.00 mg/dL   Calcium 8.5 (L) 8.9 - 10.3 mg/dL   GFR calc non Af Amer >60 >60 mL/min   GFR calc Af Amer >60 >60 mL/min   Anion gap 11 5 - 15    No results found.  Assessment/Plan: 3 Days Post-Op   Active Problems:   Closed left hip fracture (HCC)  Continue physical therapy.  Patient's family indicates they want her to come home with home services upon discharge from the hospital.  Patient should remain on Lovenox 40 mg daily for DVT prophylaxis x4 weeks postop.  She will follow-up in my office in approximately 10 days after discharge.   Thornton Park , MD 09/23/2018, 1:30 PM

## 2018-09-25 LAB — CULTURE, BLOOD (ROUTINE X 2)
Culture: NO GROWTH
Special Requests: ADEQUATE

## 2019-01-07 ENCOUNTER — Other Ambulatory Visit: Payer: Self-pay | Admitting: Internal Medicine

## 2019-01-07 DIAGNOSIS — R634 Abnormal weight loss: Secondary | ICD-10-CM

## 2019-01-07 DIAGNOSIS — R748 Abnormal levels of other serum enzymes: Secondary | ICD-10-CM

## 2019-01-07 DIAGNOSIS — E8809 Other disorders of plasma-protein metabolism, not elsewhere classified: Secondary | ICD-10-CM

## 2019-01-10 ENCOUNTER — Ambulatory Visit: Payer: Medicare Other | Attending: Internal Medicine

## 2020-02-04 IMAGING — CR DG CHEST 2V
2 series · 2 of 2 positions shown · non-contrast
Comparison: Chest x-ray dated March 15, 2016.

CLINICAL DATA: Shortness of breath.

EXAM:
CHEST - 2 VIEW

[chest pa]
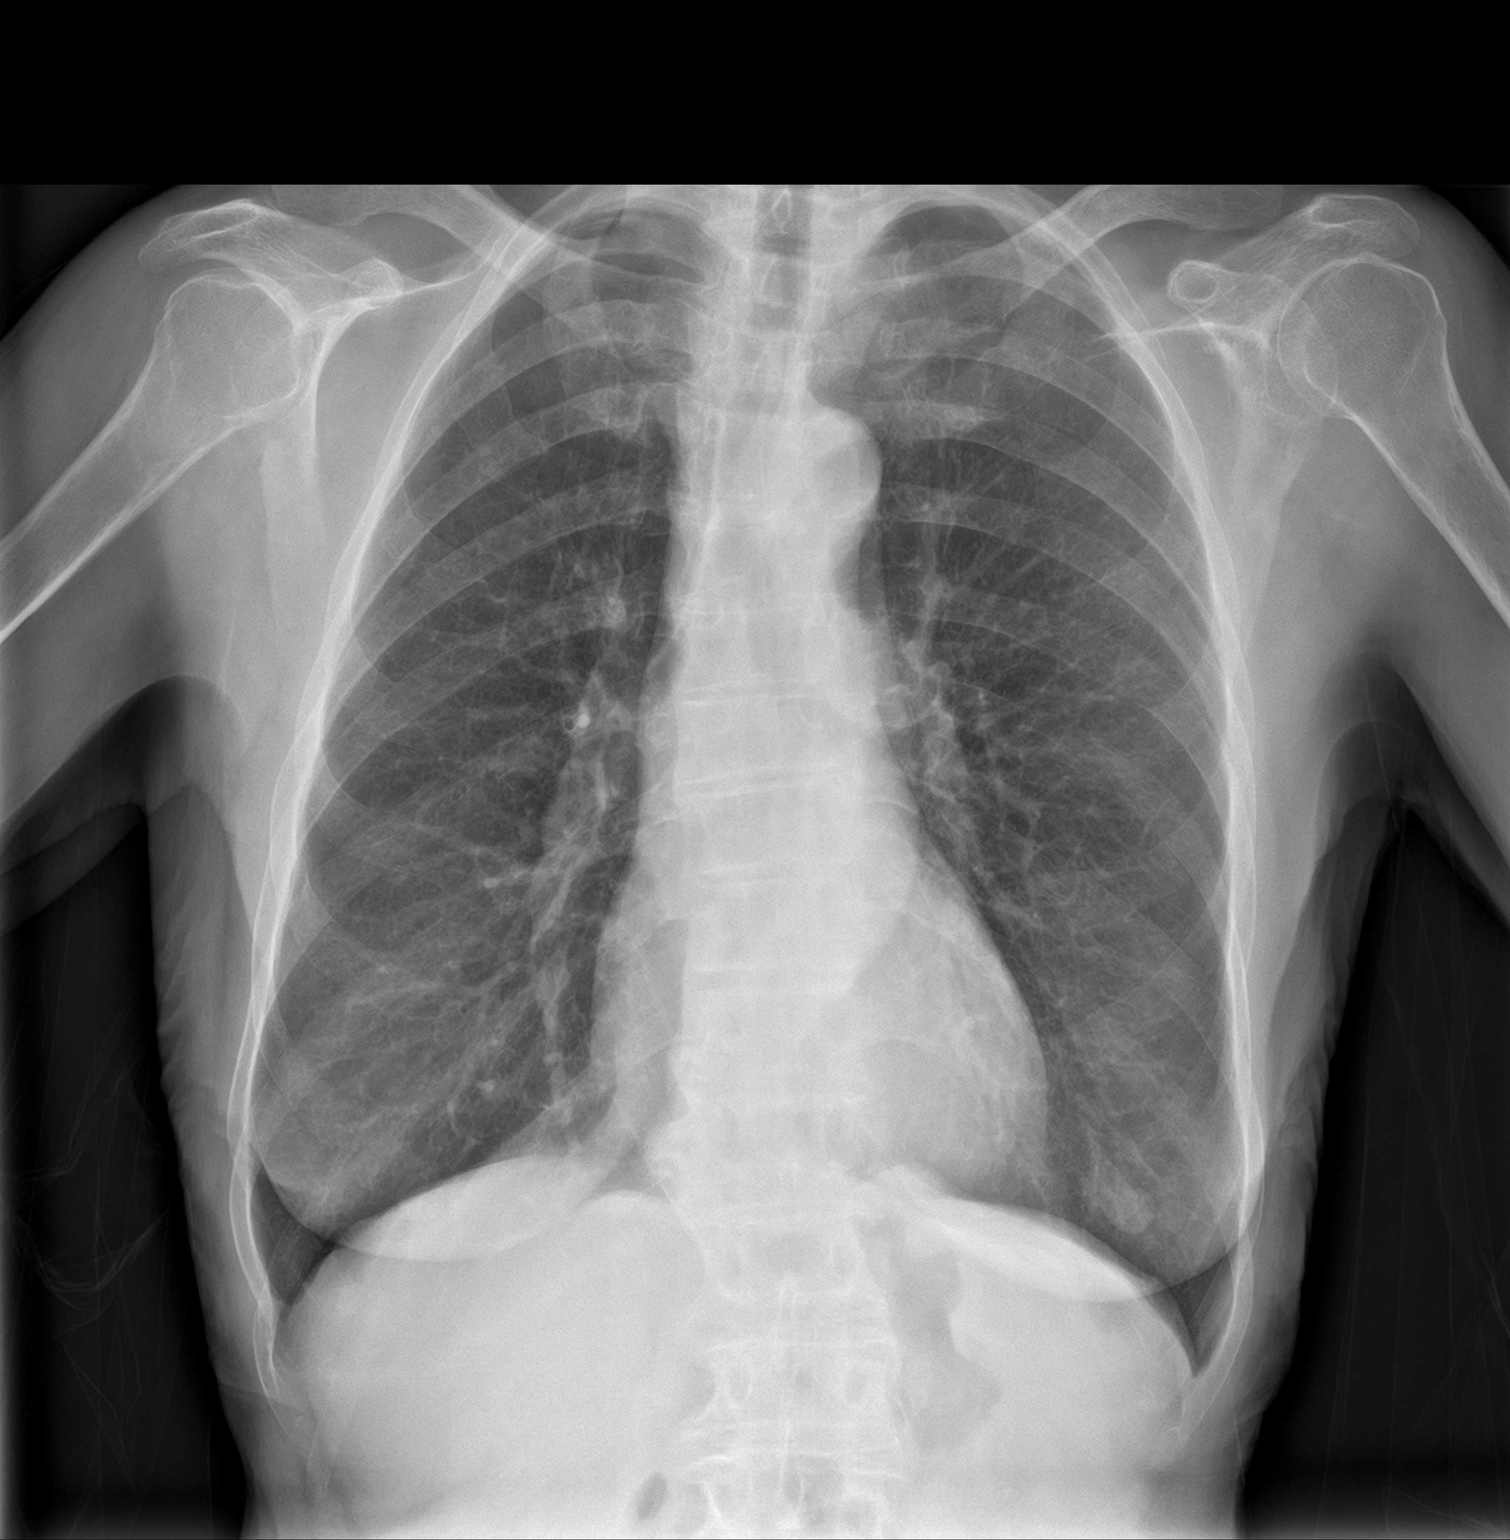

[chest lat]
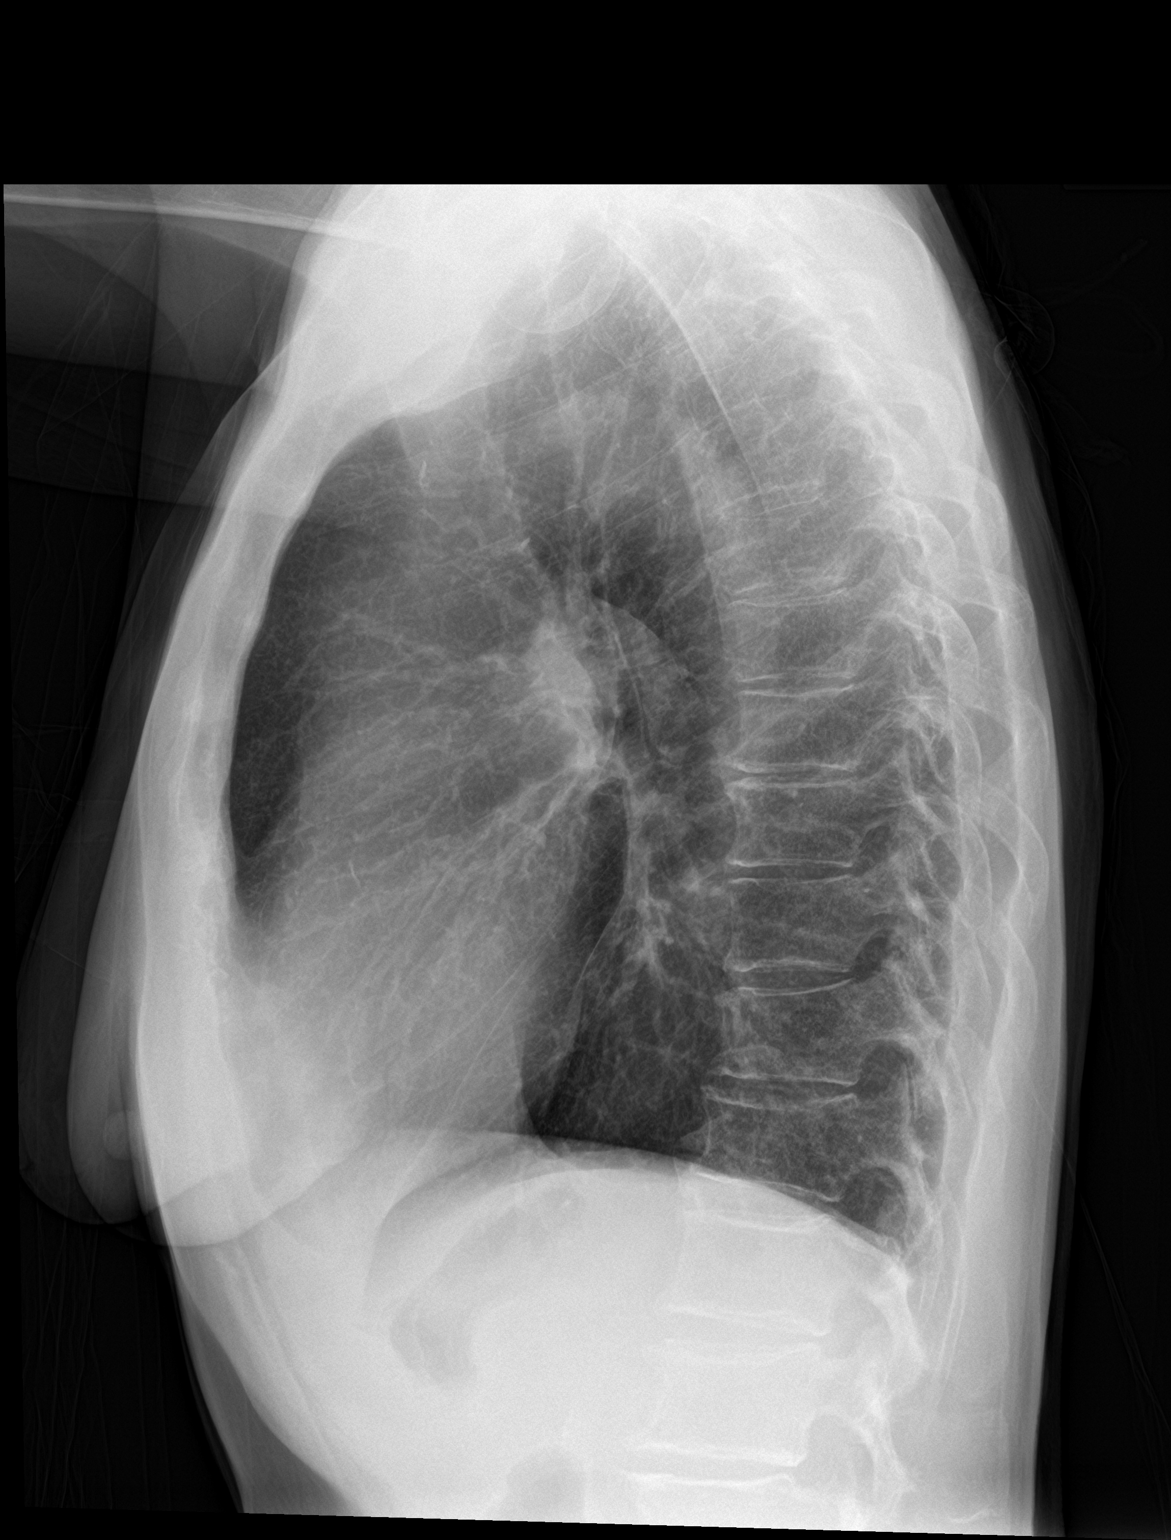

[2 of 2 positions shown; findings below may reference images not displayed]

FINDINGS: The heart size and mediastinal contours are within normal limits.
Normal pulmonary vascularity. The lungs remain hyperinflated.
Bilateral nipple shadows again noted. No focal consolidation,
pleural effusion, or pneumothorax. No acute osseous abnormality.
IMPRESSION: Mild hyperinflation, suspect COPD. No active cardiopulmonary
disease.

## 2022-06-13 ENCOUNTER — Telehealth: Payer: Self-pay

## 2022-06-19 ENCOUNTER — Other Ambulatory Visit: Payer: Self-pay

## 2022-06-19 ENCOUNTER — Emergency Department: Payer: 59

## 2022-06-19 ENCOUNTER — Encounter: Payer: Self-pay | Admitting: Intensive Care

## 2022-06-19 ENCOUNTER — Emergency Department
Admission: EM | Admit: 2022-06-19 | Discharge: 2022-06-19 | Disposition: A | Discharge status: Home / Self Care | Payer: 59 | Attending: Emergency Medicine | Admitting: Emergency Medicine

## 2022-06-19 DIAGNOSIS — M7981 Nontraumatic hematoma of soft tissue: Secondary | ICD-10-CM | POA: Insufficient documentation

## 2022-06-19 DIAGNOSIS — L0390 Cellulitis, unspecified: Secondary | ICD-10-CM | POA: Insufficient documentation

## 2022-06-19 DIAGNOSIS — R2231 Localized swelling, mass and lump, right upper limb: Secondary | ICD-10-CM | POA: Diagnosis present

## 2022-06-19 DIAGNOSIS — T148XXA Other injury of unspecified body region, initial encounter: Secondary | ICD-10-CM

## 2022-06-19 DIAGNOSIS — F0390 Unspecified dementia without behavioral disturbance: Secondary | ICD-10-CM | POA: Insufficient documentation

## 2022-06-19 LAB — COMPREHENSIVE METABOLIC PANEL
ALT: 17 U/L (ref 0–44)
AST: 26 U/L (ref 15–41)
Albumin: 4 g/dL (ref 3.5–5.0)
Alkaline Phosphatase: 65 U/L (ref 38–126)
Anion gap: 8 (ref 5–15)
BUN: 26 mg/dL — ABNORMAL HIGH (ref 8–23)
CO2: 25 mmol/L (ref 22–32)
Calcium: 8.7 mg/dL — ABNORMAL LOW (ref 8.9–10.3)
Chloride: 102 mmol/L (ref 98–111)
Creatinine, Ser: 0.93 mg/dL (ref 0.44–1.00)
GFR, Estimated: >60 mL/min (ref >60)
Glucose, Bld: 93 mg/dL (ref 70–99)
Potassium: 3.6 mmol/L (ref 3.5–5.1)
Sodium: 135 mmol/L (ref 135–145)
Total Bilirubin: 1.1 mg/dL (ref 0.3–1.2)
Total Protein: 6.4 g/dL — ABNORMAL LOW (ref 6.5–8.1)

## 2022-06-19 LAB — CBC WITH DIFFERENTIAL/PLATELET
Abs Immature Granulocytes: 0.01 10*3/uL (ref 0.00–0.07)
Basophils Absolute: 0.1 10*3/uL (ref 0.0–0.1)
Basophils Relative: 1 %
Eosinophils Absolute: 0.1 10*3/uL (ref 0.0–0.5)
Eosinophils Relative: 2 %
HCT: 39.2 % (ref 36.0–46.0)
Hemoglobin: 12.8 g/dL (ref 12.0–15.0)
Immature Granulocytes: 0 %
Lymphocytes Relative: 24 %
Lymphs Abs: 1.8 10*3/uL (ref 0.7–4.0)
MCH: 31.1 pg (ref 26.0–34.0)
MCHC: 32.7 g/dL (ref 30.0–36.0)
MCV: 95.1 fL (ref 80.0–100.0)
Monocytes Absolute: 0.6 10*3/uL (ref 0.1–1.0)
Monocytes Relative: 8 %
Neutro Abs: 4.9 10*3/uL (ref 1.7–7.7)
Neutrophils Relative %: 65 %
Platelets: 215 10*3/uL (ref 150–400)
RBC: 4.12 MIL/uL (ref 3.87–5.11)
RDW: 11.6 % (ref 11.5–15.5)
WBC: 7.4 10*3/uL (ref 4.0–10.5)
nRBC: 0 % (ref 0.0–0.2)

## 2022-06-19 MED ORDER — DOXYCYCLINE HYCLATE 100 MG PO TABS: 100.0000 mg | ORAL_TABLET | Freq: Two times a day (BID) | ORAL | 0 refills | Status: DC

## 2022-06-19 MED ORDER — DOXYCYCLINE HYCLATE 100 MG PO TABS
100.0000 mg | ORAL_TABLET | Freq: Once | ORAL | Status: AC
  Administered 2022-06-19: 100 mg via ORAL
  Filled 2022-06-19: qty 1

## 2022-06-19 NOTE — ED Provider Notes (Signed)
[facility] Provider Note    Event Date/Time   First MD Initiated Contact with Patient 06/19/22 1646     (approximate)  History   Chief Complaint: Fall, Hand Pain, and Foot Pain  HPI  Kaylee Robinson is a 77 y.o. female with a past medical history of arthritis, dementia, presents emergency department for swelling to the right hand.  According to the son for the past 2 days or so the patient had swelling to the back of the right hand, they do not know of any falls but states it is possible that the patient could have fallen while unwitnessed.  Also noted some redness to the left foot.  Denies any known fever.  States they do have a cat at home and sometimes she will get scratched by the cat but no known scratches or bites.  Physical Exam   Triage Vital Signs: ED Triage Vitals  Enc Vitals Group     BP 06/19/22 1518 120/76     Pulse Rate 06/19/22 1518 82     Resp 06/19/22 1518 16     Temp 06/19/22 1518 98.1 F (36.7 C)     Temp Source 06/19/22 1518 Oral     SpO2 06/19/22 1518 98 %     Weight 06/19/22 1515 110 lb (49.9 kg)     Height 06/19/22 1515 5\' 7"  (1.702 m)     Head Circumference --      [facility] Flow --      Pain Score --      Pain Loc --      Pain Edu? --      Excl. in [place]? --     Most recent vital signs: Vitals:   06/19/22 1518  BP: 120/76  Pulse: 82  Resp: 16  Temp: 98.1 F (36.7 C)  SpO2: 98%    General: Awake, no distress.  CV:  Good peripheral perfusion.  Regular rate and rhythm  Resp:  Normal effort.  Equal breath sounds bilaterally.  Abd:  No distention.  Soft, nontender.  No rebound or guarding. Other:  Patient has what appears to be hematoma to the dorsal aspect of the right hand, mild to moderate amount.  Good range of motion in all joints and in the hand.  Patient has no tenderness to palpation of the left foot but does have some slight erythema on the top of the left foot.    RADIOLOGY  I have reviewed and interpreted the  x-ray image of the hand I do not see any obvious fracture. Radiology is read the x-ray of the hand is soft tissue swelling without fracture  Foot x-ray shows possible nondisplaced fracture of the base of the third metatarsal.  No tenderness to palpation on my examination at this point.  MEDICATIONS ORDERED IN ED: Medications  doxycycline (VIBRA-TABS) tablet 100 mg (has no administration in time range)     IMPRESSION / MDM / ASSESSMENT AND PLAN / ED COURSE  I reviewed the triage vital signs and the nursing notes.  Patient's presentation is most consistent with acute presentation with potential threat to life or bodily function.  Patient presents emergency department after a possible fall injury or infection.  Overall the patient appears well, no distress.  Does have some hematoma to the dorsal aspect of the right hand there is a small scab to the pointer finger with some mild surrounding erythema.  Exam is most consistent with hematoma likely due to an injury although x-rays are  negative and great range of motion in the joints.  Given the small scab with some mild erythema we will cover with antibiotics as a precaution.  No point tenderness over the foot but there is mild erythema again we will cover with antibiotics as a precaution.  Patient will follow-up with her doctor.  Patient and son agreeable to plan of care.  FINAL CLINICAL IMPRESSION(S) / ED DIAGNOSES   Hematoma Cellulitis  Rx / DC Orders   Doxycycline  Note:  This document was prepared using Dragon voice recognition software and may include unintentional dictation errors.   Harvest Dark, MD 06/19/22 [phone]

## 2022-06-19 NOTE — ED Triage Notes (Signed)
Patient presents with right hand swelling and redness. Left foot swollen, red and bruised. Patient has advanced dementia. She lives with her son and the son reports she must have fallen unwitnessed.   The patient also has a cat and they are insure if the cat might have scratched her right hand that caused swelling. Wound present to right hand

## 2022-08-07 ENCOUNTER — Encounter: Payer: Self-pay | Admitting: Neurology

## 2022-08-07 ENCOUNTER — Ambulatory Visit: Payer: 59 | Admitting: Neurology

## 2022-08-11 ENCOUNTER — Emergency Department
Admission: EM | Admit: 2022-08-11 | Discharge: 2022-08-31 | Disposition: A | Discharge status: Home / Self Care | Payer: 59 | Attending: Emergency Medicine | Admitting: Emergency Medicine

## 2022-08-11 ENCOUNTER — Other Ambulatory Visit: Payer: Self-pay

## 2022-08-11 DIAGNOSIS — Z0000 Encounter for general adult medical examination without abnormal findings: Secondary | ICD-10-CM | POA: Diagnosis present

## 2022-08-11 DIAGNOSIS — G309 Alzheimer's disease, unspecified: Secondary | ICD-10-CM | POA: Insufficient documentation

## 2022-08-11 DIAGNOSIS — R001 Bradycardia, unspecified: Secondary | ICD-10-CM | POA: Insufficient documentation

## 2022-08-11 DIAGNOSIS — R8290 Unspecified abnormal findings in urine: Secondary | ICD-10-CM | POA: Insufficient documentation

## 2022-08-11 DIAGNOSIS — F0150 Vascular dementia without behavioral disturbance: Secondary | ICD-10-CM | POA: Diagnosis not present

## 2022-08-11 DIAGNOSIS — I10 Essential (primary) hypertension: Secondary | ICD-10-CM | POA: Insufficient documentation

## 2022-08-11 DIAGNOSIS — Z139 Encounter for screening, unspecified: Secondary | ICD-10-CM

## 2022-08-11 HISTORY — DX: Essential (primary) hypertension: I10

## 2022-08-11 HISTORY — DX: Vascular dementia, unspecified severity, without behavioral disturbance, psychotic disturbance, mood disturbance, and anxiety: F01.50

## 2022-08-11 HISTORY — DX: Dementia in other diseases classified elsewhere, unspecified severity, without behavioral disturbance, psychotic disturbance, mood disturbance, and anxiety: F02.80

## 2022-08-11 LAB — BASIC METABOLIC PANEL
Anion gap: 9 (ref 5–15)
BUN: 20 mg/dL (ref 8–23)
CO2: 26 mmol/L (ref 22–32)
Calcium: 8.9 mg/dL (ref 8.9–10.3)
Chloride: 102 mmol/L (ref 98–111)
Creatinine, Ser: 0.99 mg/dL (ref 0.44–1.00)
GFR, Estimated: 59 mL/min — ABNORMAL LOW (ref >60)
Glucose, Bld: 106 mg/dL — ABNORMAL HIGH (ref 70–99)
Potassium: 4.1 mmol/L (ref 3.5–5.1)
Sodium: 137 mmol/L (ref 135–145)

## 2022-08-11 LAB — CBC
HCT: 40.3 % (ref 36.0–46.0)
Hemoglobin: 13.2 g/dL (ref 12.0–15.0)
MCH: 30.6 pg (ref 26.0–34.0)
MCHC: 32.8 g/dL (ref 30.0–36.0)
MCV: 93.5 fL (ref 80.0–100.0)
Platelets: 229 10*3/uL (ref 150–400)
RBC: 4.31 MIL/uL (ref 3.87–5.11)
RDW: 11.7 % (ref 11.5–15.5)
WBC: 5.5 10*3/uL (ref 4.0–10.5)
nRBC: 0 % (ref 0.0–0.2)

## 2022-08-11 MED ORDER — DOCUSATE SODIUM 100 MG PO CAPS
100.0000 mg | ORAL_CAPSULE | Freq: Two times a day (BID) | ORAL | Status: DC
  Administered 2022-08-11 – 2022-08-30 (×39): 100 mg via ORAL
  Filled 2022-08-11 (×39): qty 1

## 2022-08-11 MED ORDER — MEMANTINE HCL 5 MG PO TABS
5.0000 mg | ORAL_TABLET | Freq: Two times a day (BID) | ORAL | Status: DC
  Administered 2022-08-11 – 2022-08-30 (×39): 5 mg via ORAL
  Filled 2022-08-11 (×39): qty 1

## 2022-08-11 MED ORDER — TRAZODONE HCL 50 MG PO TABS
50.0000 mg | ORAL_TABLET | ORAL | Status: AC
  Administered 2022-08-11: 50 mg via ORAL
  Filled 2022-08-11: qty 1

## 2022-08-11 MED ORDER — TRAZODONE HCL 50 MG PO TABS: 50.0000 mg | ORAL_TABLET | Freq: Every day | ORAL | Status: DC

## 2022-08-11 MED ORDER — FERROUS SULFATE 325 (65 FE) MG PO TABS
325.0000 mg | ORAL_TABLET | Freq: Three times a day (TID) | ORAL | Status: DC
  Administered 2022-08-12 – 2022-08-31 (×54): 325 mg via ORAL
  Filled 2022-08-11 (×53): qty 1

## 2022-08-11 MED ORDER — MELATONIN 5 MG PO TABS
5.0000 mg | ORAL_TABLET | Freq: Every evening | ORAL | Status: DC | PRN
  Administered 2022-08-11 – 2022-08-30 (×14): 5 mg via ORAL
  Filled 2022-08-11 (×15): qty 1

## 2022-08-11 MED ORDER — LORAZEPAM 0.5 MG PO TABS
0.5000 mg | ORAL_TABLET | Freq: Every day | ORAL | Status: DC | PRN
  Administered 2022-08-11 – 2022-08-23 (×12): 0.5 mg via ORAL
  Filled 2022-08-11 (×13): qty 1

## 2022-08-11 MED ORDER — POLYETHYLENE GLYCOL 3350 17 G PO PACK: 17.0000 g | PACK | Freq: Every day | ORAL | Status: DC | PRN

## 2022-08-11 MED ORDER — ALUM & MAG HYDROXIDE-SIMETH 200-200-20 MG/5ML PO SUSP: 30.0000 mL | ORAL | Status: DC | PRN

## 2022-08-11 NOTE — ED Notes (Signed)
Pt repeatedly calling out for husband and getting anxious about him not being here. Pt given prn ativan for anxiety, see MAR

## 2022-08-11 NOTE — ED Triage Notes (Signed)
Pt to ED via [facility]EM[facility] from home. Pt has vascular dementia, anonymous reporter called DSS yesterday. DSS went to home today, pt was found locked in bedroom with padlock on door and windows boarded up, pt's urine and feces on floor of pt room and also cat feces and urine on her floor. Also entire home had extreme hoarding and cat feces everywhere. CSW states pt med rx have also not been filled since 9/23.   DSS staff who is here with pt states per DSS the home environment is not safe for pt and they are taking over legal guardianship of pt (will have by next Tue) and she left APS protective order paperwork for pt chart. CSW is [employer], work cell [phone] and desk number is [phone].  Sheriff also at bedside. CSW is staying until pt roomed to look for wounds and take pictures to document case.   Per CSW, if son/wife come to see pt they cannot see pt until cleared by them.  AEMS VS: SB, HR 56, 113/65, CBG 128, 98% on RA, 18# L forearm.   Pt is asking "where's my husband?" But husband is actually deceased. Oriented only to self.

## 2022-08-11 NOTE — ED Notes (Signed)
Pt assisted to the bathroom.

## 2022-08-11 NOTE — ED Provider Notes (Signed)
[facility] Provider Note    Event Date/Time   First MD Initiated Contact with Patient 08/11/22 1931     (approximate)   History   APS case and placement   HPI  Kaylee Robinson is a 77 y.o. female   Past medical history of Alzheimer's and vascular dementia, arthritis and hypertension presents emergency department as an APS case.  By report, anonymous reported called DSS yesterday who went to the home and found the patient locked in a bedroom with padlock on the door and windows boarded up with urine and feces on the floor the patient's room and cat feces and urine on the floor as well.  DSS staff states home environment is not safe for the patient they are taking over legal guardianship with protective order paperwork here.  The patient has no acute medical complaints and keeps asking for her husband.        Physical Exam   Triage Vital Signs: ED Triage Vitals  Enc Vitals Group     BP 08/11/22 1843 124/79     Pulse Rate 08/11/22 1843 (!) 56     Resp 08/11/22 1843 18     Temp 08/11/22 1843 98.4 F (36.9 C)     Temp Source 08/11/22 1843 Oral     SpO2 08/11/22 1843 98 %     Weight 08/11/22 1844 120 lb (54.4 kg)     Height 08/11/22 1844 5\' 6"  (1.676 m)     Head Circumference --      Peak Flow --      Pain Score --      Pain Loc --      Pain Edu? --      Excl. in GC? --     Most recent vital signs: Vitals:   08/11/22 1843  BP: 124/79  Pulse: (!) 56  Resp: 18  Temp: 98.4 F (36.9 C)  SpO2: 98%    General: Awake, no distress.  CV:  Good peripheral perfusion.  Resp:  Normal effort.  Abd:  No distention.  Other:  All extremities no signs of trauma normal vital signs afebrile comfortable appearing.  Lungs clear abdomen soft and nontender   ED Results / Procedures / Treatments   Labs (all labs ordered are listed, but only abnormal results are displayed) Labs Reviewed  BASIC METABOLIC PANEL - Abnormal; Notable for the following  components:      Result Value   Glucose, Bld 106 (*)    GFR, Estimated 59 (*)    All other components within normal limits  CBC  CBG MONITORING, ED     I ordered and reviewed the above labs they are notable for normal cell counts and electrolytes  EKG  ED ECG REPORT I, Pilar Jarvis, the attending physician, personally viewed and interpreted this ECG.   Date: 08/11/2022  EKG Time: 1850  Rate: 56  Rhythm: sinus bradycardia  Axis: nl  Intervals:short pr  ST&T Change: no stemi   PROCEDURES:  Critical Care performed: No  Procedures   MEDICATIONS ORDERED IN ED: Medications - No data to display  IMPRESSION / MDM / ASSESSMENT AND PLAN / ED COURSE  I reviewed the triage vital signs and the nursing notes.                                Patient's presentation is most consistent with acute presentation with potential threat to life  or bodily function.  Differential diagnosis includes, but is not limited to, medical clearance for APS protective services   The patient is on the cardiac monitor to evaluate for evidence of arrhythmia and/or significant heart rate changes.  MDM: This is a patient with the above past medical history and HPI Adult Protective Services here taking over legal guardianship for the patient.  She has no acute medical complaints and looks well on my exam with normal blood work.  Medically cleared for social work/APS placement.        FINAL CLINICAL IMPRESSION(S) / ED DIAGNOSES   Final diagnoses:  Encounter for medical screening examination     Rx / DC Orders   ED Discharge Orders     None        Note:  This document was prepared using Dragon voice recognition software and may include unintentional dictation errors.    Pilar Jarvis, MD 08/11/22 2021

## 2022-08-12 DIAGNOSIS — Z0000 Encounter for general adult medical examination without abnormal findings: Secondary | ICD-10-CM | POA: Diagnosis not present

## 2022-08-12 LAB — URINALYSIS, W/ REFLEX TO CULTURE (INFECTION SUSPECTED)
Bacteria, UA: NONE SEEN
Bilirubin Urine: NEGATIVE
Glucose, UA: NEGATIVE mg/dL
Ketones, ur: NEGATIVE mg/dL
Nitrite: NEGATIVE
Protein, ur: NEGATIVE mg/dL
Specific Gravity, Urine: 1.006 (ref 1.005–1.030)
pH: 6 (ref 5.0–8.0)

## 2022-08-12 MED ORDER — HALOPERIDOL LACTATE 5 MG/ML IJ SOLN
5.0000 mg | Freq: Four times a day (QID) | INTRAMUSCULAR | Status: DC | PRN
  Administered 2022-08-12 – 2022-08-25 (×8): 5 mg via INTRAMUSCULAR
  Filled 2022-08-12 (×8): qty 1

## 2022-08-12 NOTE — ED Provider Notes (Signed)
-----------------------------------------   2:13 AM on 08/12/2022 -----------------------------------------  Patient is increasingly agitated and disruptive, some concern that she is going to harm herself by trying to climb out of bed.  I previously ordered a dose of trazodone 50 mg p.o. to help her sleep.  However now she is yelling out, having hallucinations, etc.  I ordered haloperidol 5 mg IV every 6 hours as needed for agitation.   Loleta Rose, MD 08/12/22 [phone]

## 2022-08-12 NOTE — ED Notes (Addendum)
Pt continues to be confused. Pt asking for [place] to call her mom and dad to come pick her up. Then asking were her husband is and when is he going to be here. (Husband is deceased) Pt has not been aggressive and is easy to redirect. Resting in bed at this time.

## 2022-08-12 NOTE — ED Notes (Signed)
Pt placed into hospital bed and bed alarm in place. Pt pleasantly confused asking where her husband is.

## 2022-08-12 NOTE — ED Notes (Signed)
Pt was assisted from bed to toilet by this RN and Ariel NT. New brief was given to pt and pt was assisted back to bed. Warm blankets given. Pt in bed resting.

## 2022-08-12 NOTE — ED Notes (Signed)
This RN to bedside, bed alarm alarming at this time. Pt is standing to the commode asking where her husband is. Pt easily redirectable back onto the bed at this time. Pt clean and dry. Bed alarm reactivated.

## 2022-08-12 NOTE — ED Notes (Signed)
Pt bed alarm was going off. This tech went in pt room and pt was trying to get out of bed because she had to used the bathroom. This tech helped pt out of bed and on the toilet to use the toilet. Pt finished using the bathroom and this tech help pt back into bed. Pt back in bed and no other needs at the moment.

## 2022-08-12 NOTE — ED Notes (Signed)
Pt given breakfast tray

## 2022-08-12 NOTE — ED Notes (Addendum)
Dinner meal tray delivered at this time, setup assistance required. No feeding assistance needed.  

## 2022-08-12 NOTE — ED Notes (Signed)
Hospital meal provided, pt tolerated w/o complaints.  Waste discarded appropriately.  

## 2022-08-12 NOTE — ED Notes (Signed)
Pt linens changed at this time. New chux placed. Pt resting comfortably with snack and beverage provided.

## 2022-08-12 NOTE — Progress Notes (Signed)
CSW contacted [employer] with [facility] APS, [phone]. CSW left an voicemail requesting an update on the plan of care. The notes state Kaylee Robinson plans to file for guardianship by Tuesday.

## 2022-08-13 DIAGNOSIS — Z0000 Encounter for general adult medical examination without abnormal findings: Secondary | ICD-10-CM | POA: Diagnosis not present

## 2022-08-13 LAB — URINE CULTURE

## 2022-08-13 NOTE — ED Provider Notes (Signed)
Emergency Medicine Observation Re-evaluation Note  Kaylee Robinson is a 77 y.o. female, seen on rounds today.  Pt initially presented to the ED for complaints of APS case and placement Currently, the patient is calm, resting.  Physical Exam  BP 117/68 (BP Location: Right Arm)   Pulse (!) 50   Temp 98 F (36.7 C) (Oral)   Resp 17   Ht 5\' 6"  (1.676 m)   Wt 54.4 kg   SpO2 97%   BMI 19.37 kg/m    ED Course / MDM  EKG:   I have reviewed the labs performed to date as well as medications administered while in observation.  Recent changes in the last 24 hours include none.  Plan  Current plan is for SW disposition.    Shaune Pollack, MD 08/13/22 [phone]

## 2022-08-13 NOTE — ED Notes (Addendum)
Pt has hitting and getting aggressive with Clinical research associate

## 2022-08-13 NOTE — ED Notes (Signed)
Bed Bath given by [employer] with assistance from [place]

## 2022-08-13 NOTE — ED Notes (Signed)
Pt given sips of fresh water per request

## 2022-08-13 NOTE — ED Notes (Signed)
Pt asked for more water. Pt given a sip of fresh water again at 0148

## 2022-08-13 NOTE — ED Notes (Signed)
Pt restless, talking non-stop, and climbing out of bed. Pt not responding to redirection at this time. Pt stating she was hungry. Pt provided multiple graham crackers and multiple sips of water to satisfy the pts needs.

## 2022-08-13 NOTE — ED Notes (Signed)
Pt informed this EDT that she was thirsty repeatedly until pt was yelling that she needed water. Pt provided water 3 times within a 5 minute time-span to help reduce pts thirst.

## 2022-08-13 NOTE — ED Notes (Signed)
Pt given a sip of fresh water due to continuous complaints of being thirsty.

## 2022-08-13 NOTE — Progress Notes (Signed)
Patients adult protective services worker, Junious Silk with [facility] APS, [phone] is in the process of filing for guardianship. CSW has not received a call back about patients plan of care.

## 2022-08-14 DIAGNOSIS — Z0000 Encounter for general adult medical examination without abnormal findings: Secondary | ICD-10-CM | POA: Diagnosis not present

## 2022-08-14 MED ORDER — ACETAMINOPHEN 325 MG PO TABS
650.0000 mg | ORAL_TABLET | Freq: Once | ORAL | Status: AC
  Administered 2022-08-14: 650 mg via ORAL
  Filled 2022-08-14: qty 2

## 2022-08-14 NOTE — ED Notes (Signed)
Dietary contacted for lunch delivery

## 2022-08-14 NOTE — ED Notes (Signed)
Lunch tray delivered.

## 2022-08-14 NOTE — ED Provider Notes (Signed)
-----------------------------------------   7:55 AM on 08/14/2022 -----------------------------------------   Blood pressure 124/68, pulse 86, temperature 98.2 F (36.8 C), temperature source Oral, resp. rate 18, height 1.676 m (5\' 6" ), weight 54.4 kg, SpO2 100 %.  The patient is calm and cooperative at this time.  There have been no acute events since the last update.  Awaiting disposition plan from [facility] team.   Loleta Rose, MD 08/14/22 [phone]

## 2022-08-14 NOTE — ED Notes (Signed)
Pt requesting meds for hip pain. Orders placed

## 2022-08-15 DIAGNOSIS — Z0000 Encounter for general adult medical examination without abnormal findings: Secondary | ICD-10-CM | POA: Diagnosis not present

## 2022-08-15 NOTE — ED Notes (Signed)
Pt woke up stating " I have to use the bathroom" This NT noticed that bedding was wet so this NT changed lien,gown and blankets. Pt is now back in bed resting.

## 2022-08-15 NOTE — Progress Notes (Addendum)
  Patients adult protective services worker, Junious Silk with [facility] APS, [phone] is in the process of filing for guardianship. CSW has not received a call back about patients plan of care.      LVM today with Crysta for updates on planl, TOC supervisor informed. TOC supervisor to Email Ellin Saba DSS supervisor to request updates on disposition.   Darolyn Rua, [place], MSW, [place] [phone]

## 2022-08-15 NOTE — ED Notes (Signed)
Pt with increased agitation and is getting visibly upset with safety sitter and is throwing items at safety sitter, de escalation tactics unsuccessful. See MAR for intervention.

## 2022-08-15 NOTE — TOC Initial Note (Signed)
Transition of Care [facility]) - Initial/Assessment Note    Patient Details  Name: Elfredia Machen MRN: [id number] Date of Birth: May 18, 1945  Transition [facility]) CM/SW Contact:    Darolyn Rua, LCSW Phone Number: 08/15/2022, 4:52 PM  Clinical Narrative:                        Junious Silk [phone] called this CSW back to report they are working towards guardianship and have a protective order, they have applied for special assistance medicaid and are looking at [employer]. She requests fl2 be sent to her along with H&P or first MD progress note made by MD at:   [email or web address]       Expected Discharge Plan: Memory Care Barriers to Discharge:  (placement - guardianship)   Patient Goals and CMS Choice            Expected Discharge Plan and Services                                              Prior Living Arrangements/Services                       Activities of Daily Living      Permission Sought/Granted                  Emotional Assessment              Admission diagnosis:  Med Eval Patient Active Problem List   Diagnosis Date Noted   Closed left hip fracture (HCC) 09/20/2018   PCP:  [facility] Pharmacy:  No Pharmacies Listed    Social Determinants of Health (SDOH) Social History: SDOH Screenings   Tobacco Use: High Risk (08/11/2022)   SDOH Interventions:     Readmission Risk Interventions     No data to display

## 2022-08-15 NOTE — [place] FL2 (Signed)
[place] MEDICAID FL2 LEVEL OF CARE FORM     IDENTIFICATION  Patient Name: Kaylee Robinson Birthdate: 01-05-1946 Sex: female Admission Date (Current Location): 08/11/2022  [facility] and [place] Number:  Chiropodist and Address:  [facility], [address] [place], [place] [address]      Provider Number: [id number]  Attending Physician Name and Address:  No att. providers found  Relative Name and Phone Number:  Renae Fickle (son)  [phone]    Current Level of Care: Hospital Recommended Level of Care: Memory Care Prior Approval Number:    Date Approved/Denied:   PASRR Number: [id number]  Discharge Plan: Other (Comment) [facility])    Current Diagnoses: Patient Active Problem List   Diagnosis Date Noted   Closed left hip fracture (HCC) 09/20/2018    Orientation RESPIRATION BLADDER Height & Weight     Self  Normal Incontinent Weight: 120 lb (54.4 kg) Height:  5\' 6"  (167.6 cm)  BEHAVIORAL SYMPTOMS/MOOD NEUROLOGICAL BOWEL NUTRITION STATUS      Incontinent Diet (see discharge summary)  AMBULATORY STATUS COMMUNICATION OF NEEDS Skin   Limited Assist Verbally Normal                       Personal Care Assistance Level of Assistance  Bathing, Feeding, Dressing, Total care Bathing Assistance: Limited assistance Feeding assistance: Independent Dressing Assistance: Limited assistance Total Care Assistance: Limited assistance   Functional Limitations Info             SPECIAL CARE FACTORS FREQUENCY                       Contractures Contractures Info: Not present    Additional Factors Info  Code Status, Allergies Code Status Info: full Allergies Info: Enalapril  Naproxen  Amoxicillin  Sulfa Antibiotics           Current Medications (08/15/2022):  This is the current hospital active medication list Current Facility-Administered Medications  Medication Dose Route Frequency Provider Last Rate Last Admin   alum &  mag hydroxide-simeth (MAALOX/MYLANTA) 200-200-20 MG/5ML suspension 30 mL  30 mL Oral Q4H PRN Pilar Jarvis, MD       docusate sodium (COLACE) capsule 100 mg  100 mg Oral BID Pilar Jarvis, MD   100 mg at 08/15/22 0908   ferrous sulfate tablet 325 mg  325 mg Oral TID Nigel Mormon, MD   325 mg at 08/15/22 1224   haloperidol lactate (HALDOL) injection 5 mg  5 mg Intramuscular Q6H PRN Loleta Rose, MD   5 mg at 08/13/22 2020   LORazepam (ATIVAN) tablet 0.5 mg  0.5 mg Oral Daily PRN Pilar Jarvis, MD   0.5 mg at 08/13/22 2151   melatonin tablet 5 mg  5 mg Oral QHS PRN Pilar Jarvis, MD   5 mg at 08/14/22 2231   memantine (NAMENDA) tablet 5 mg  5 mg Oral BID Pilar Jarvis, MD   5 mg at 08/15/22 0908   polyethylene glycol (MIRALAX / GLYCOLAX) packet 17 g  17 g Oral Daily PRN Pilar Jarvis, MD       Current Outpatient Medications  Medication Sig Dispense Refill   alum & mag hydroxide-simeth (MAALOX/MYLANTA) 200-200-20 MG/5ML suspension Take 30 mLs by mouth every 4 (four) hours as needed for indigestion. 355 mL 0   cetirizine (ZYRTEC) 10 MG tablet Take 10 mg by mouth daily.     docusate sodium (COLACE) 100 MG capsule Take 1 capsule (  100 mg total) by mouth 2 (two) times daily. 10 capsule 0   ferrous sulfate 325 (65 FE) MG tablet Take 1 tablet (325 mg total) by mouth 3 (three) times daily after meals. 30 tablet 0   LORazepam (ATIVAN) 0.5 MG tablet Take by mouth. Take one half tablet to one tablet (0.25-0.5 mg dose) by mouth daily as needed for Anxiety. Max Daily Amount: 0.5 mg     Melatonin 5 MG TABS Take 1 tablet (5 mg total) by mouth at bedtime as needed (sleep). 30 tablet 0   memantine (NAMENDA) 5 MG tablet Take 5 mg by mouth 2 (two) times daily.     polyethylene glycol (MIRALAX / GLYCOLAX) 17 g packet Take 17 g by mouth daily as needed for mild constipation. 14 each 0   albuterol (PROVENTIL HFA;VENTOLIN HFA) 108 (90 Base) MCG/ACT inhaler Inhale 2 puffs into the lungs every 6 (six) hours as needed for wheezing or  shortness of breath. (Patient not taking: Reported on 08/11/2022) 1 Inhaler 0   bisacodyl (DULCOLAX) 10 MG suppository Place 1 suppository (10 mg total) rectally daily as needed for moderate constipation. (Patient not taking: Reported on 08/11/2022) 12 suppository 0   donepezil (ARICEPT) 5 MG tablet Take 5 mg by mouth at bedtime. (Patient not taking: Reported on 08/11/2022)     doxycycline (VIBRA-TABS) 100 MG tablet Take 1 tablet (100 mg total) by mouth 2 (two) times daily. (Patient not taking: Reported on 08/11/2022) 20 tablet 0   fluticasone (FLONASE) 50 MCG/ACT nasal spray USE 2 SPRAY(S) IN EACH NOSTRIL ONCE DAILY (Patient not taking: Reported on 08/11/2022)     HYDROcodone-acetaminophen (NORCO) 7.5-325 MG tablet Take 1 tablet by mouth every 4 (four) hours as needed for severe pain (pain score 7-10). (Patient not taking: Reported on 08/11/2022) 30 tablet 0   methocarbamol (ROBAXIN) 500 MG tablet Take 1 tablet (500 mg total) by mouth every 6 (six) hours as needed for muscle spasms. (Patient not taking: Reported on 08/11/2022) 30 tablet 0   OLANZapine (ZYPREXA) 2.5 MG tablet Take 1 tablet (2.5 mg total) by mouth 2 (two) times daily as needed (agitation). (Patient not taking: Reported on 08/11/2022) 30 tablet 0   sertraline (ZOLOFT) 50 MG tablet Take 50 mg by mouth daily. (Patient not taking: Reported on 08/11/2022)       Discharge Medications: Please see discharge summary for a list of discharge medications.  Relevant Imaging Results:  Relevant Lab Results:   Additional Information SSN: [id number]  Darolyn Rua, LCSW

## 2022-08-16 DIAGNOSIS — Z0000 Encounter for general adult medical examination without abnormal findings: Secondary | ICD-10-CM | POA: Diagnosis not present

## 2022-08-16 NOTE — ED Notes (Addendum)
Pt urinated in the bed this NT and NT Mayra cleaned up pt and applied new sheets and new gown.    

## 2022-08-16 NOTE — ED Notes (Signed)
Pt is currently sleeping. Rise and fall of chest noted. Snoring is audible as well.

## 2022-08-16 NOTE — TOC Progression Note (Signed)
Transition of Care [facility]) - Progression Note    Patient Details  Name: Kaylee Robinson MRN: [id number] Date of Birth: Dec 15, 1945  Transition of Care [facility]) CM/SW Contact  Margarito Liner, LCSW Phone Number: 08/16/2022, 9:31 AM  Clinical Narrative:  Sent signed Clovis Cao and initial MD note to [employer] with [facility] DSS in a secure email.   Expected Discharge Plan: Memory Care Barriers to Discharge:  (placement - guardianship)  Expected Discharge Plan and Services                                               Social Determinants of Health (SDOH) Interventions SDOH Screenings   Tobacco Use: High Risk (08/11/2022)    Readmission Risk Interventions     No data to display

## 2022-08-16 NOTE — ED Notes (Signed)
Pt woke up having to use the bathroom.This NT took Pt to bathroom, NT then notice that Pt bed was wet this NT changed gown and lien. Pt is now back in bed resting.

## 2022-08-16 NOTE — ED Notes (Addendum)
Pt will not quit talking so room mate can sleep. Pt urinated on her bedding. Pt was given a clean gown and sat in recliner while this tech changed her bed. This tech thought pt might like the recliner better since she was not wanting to stay in the bed and will not quit talking to room mate can sleep. The recliner was not a succsesful attempt as pt kept trying to get out of it as well. Pt helped back to bed and stronly encouraged to try to rest as it was night time and roome mate was trying to sleep. This tech has tried several different approaches to help pt relax. Messaged her lower legs and feet, stroked her hair, attempted to message her hands, encouraged pt to watch tv, etc.

## 2022-08-16 NOTE — ED Notes (Signed)
Pt continues to want to get out of bed and keeps talking. Pt is very confused. Room mate can not sleep because Pt will not be quiet.

## 2022-08-16 NOTE — ED Notes (Signed)
Pt is attempting to get out of bed. States needs to check on son. Needs constant redirection.

## 2022-08-16 NOTE — ED Notes (Signed)
Pt will not stay in bed or quit talking so room mate can sleep.

## 2022-08-16 NOTE — ED Notes (Signed)
Pt constantly trying to get out of be will addressing other PT in the room .

## 2022-08-17 DIAGNOSIS — Z0000 Encounter for general adult medical examination without abnormal findings: Secondary | ICD-10-CM | POA: Diagnosis not present

## 2022-08-17 NOTE — ED Provider Notes (Signed)
Emergency Medicine Observation Re-evaluation Note  Kaylee Robinson is a 77 y.o. female, seen on rounds today.  Pt initially presented to the ED for complaints of APS case and placement  Currently, the patient is calm, no acute complaints.  Physical Exam  Blood pressure (!) 120/53, pulse (!) 59, temperature 98 F (36.7 C), temperature source Oral, resp. rate 18, height 5\' 6"  (1.676 m), weight 54.4 kg, SpO2 99 %. Physical Exam General: NAD Lungs: CTAB Psych: not agitated  ED Course / MDM  EKG:    I have reviewed the labs performed to date as well as medications administered while in observation.  Recent changes in the last 24 hours include no acute events overnight.    Plan  Current plan is for [facility] placement   Sharman Cheek, MD 08/17/22 [phone]

## 2022-08-17 NOTE — TOC Progression Note (Signed)
Transition of Care [facility]) - Progression Note    Patient Details  Name: Kaylee Robinson MRN: [id number] Date of Birth: Apr 11, 1945  Transition of Care [facility]) CM/SW Contact  Darolyn Rua, [place] Phone Number: 08/17/2022, 9:22 AM  Clinical Narrative:     CSW spoke with Junious Silk with APS who reports she did received requested clinicals and fl2, she has sent over to [facility] and is pending response from them of when they can come complete patient's in person assessment. She reports she will keep this CSW updated.   Expected Discharge Plan: Memory Care Barriers to Discharge:  (placement - guardianship)  Expected Discharge Plan and Services                                               Social Determinants of Health (SDOH) Interventions SDOH Screenings   Tobacco Use: High Risk (08/11/2022)    Readmission Risk Interventions     No data to display

## 2022-08-17 NOTE — ED Notes (Signed)
Assumed care from [facility]. Pt in bed at this time. Safety sitter at bedside.

## 2022-08-17 NOTE — ED Notes (Signed)
Pt states to RN "give me a lighter and a small cigarette. I need one right now." RN advised pt she can not have that or smoke in hospitals but we can get a nicotine patch if she would like. Pt states "give me one now you dumb whore hopping bitch." Pt trying to get out of bed and holding napkin requesting RN to light the small piece of napkin. RN redirected pt to lay in bed.

## 2022-08-17 NOTE — ED Notes (Signed)
Pt ambulated to restroom. Pt had a bm and urinated.

## 2022-08-17 NOTE — ED Notes (Signed)
Pt ambulated to restroom. Pt voided and returned to bed. 

## 2022-08-17 NOTE — ED Notes (Signed)
Pt refusing vital signs and constantly getting out of bed stating "I need to get out".

## 2022-08-17 NOTE — ED Notes (Signed)
Pt getting out of bed stating "I need to go to the bathroom right now before I shit" RN assisted pt to restroom, pt did not urinate or have bowel movement. Pt states "yes I did".

## 2022-08-17 NOTE — ED Notes (Signed)
Pt refuses to stay in the bed. Pt continue to get up asking for her son and her husband pt has almost fallen twice and still refuses to sit down. RN notified

## 2022-08-17 NOTE — ED Notes (Signed)
Pt sisters called for update on pt. RN advised pt family we can not share information with anyone at this time. Sister states "well someone needs to tell me something, what do I have to do to get it?" RN restated we can not share any information.

## 2022-08-17 NOTE — ED Provider Notes (Signed)
Emergency Medicine Observation Re-evaluation Note  Kaylee Robinson is a 77 y.o. female, seen on rounds today.  Pt initially presented to the ED for complaints of APS case and placement  Currently, the patient is awake, laying in bed in no acute distress. No reported issues from nursing team.   Physical Exam  BP (!) 120/53   Pulse (!) 59   Temp 98 F (36.7 C) (Oral)   Resp 18   Ht 5\' 6"  (1.676 m)   Wt 54.4 kg   SpO2 99%   BMI 19.37 kg/m  Physical Exam General: Laying comfortably in bed  ED Course / MDM  EKG:EKG Interpretation  Date/Time:  Friday Aug 11 2022 18:50:50 EDT Ventricular Rate:  56 PR Interval:  80 QRS Duration: 102 QT Interval:  434 QTC Calculation: 418 R Axis:   40 Text Interpretation: Sinus bradycardia with short PR ST & T wave abnormality, consider anterolateral ischemia Abnormal ECG When compared with ECG of 19-Sep-2018 22:06, PREVIOUS ECG IS PRESENT Confirmed by UNCONFIRMED, DOCTOR ([id number]), editor Kearney Hard ([id number]) on 08/15/2022 3:44:00 PM  I have reviewed the labs and medications over the past 24 hours. Recent changes in the last 24 hours are none.    Plan  Current plan is for dispo per social work.    Trinna Post, MD 08/17/22 [phone]

## 2022-08-17 NOTE — ED Notes (Signed)
Pt cleaned up and assisted to restroom and back to bed. Pt given clean gown. Clean pads placed on bed and new blankets given to pt. Pt resting pleasantly.

## 2022-08-17 NOTE — ED Notes (Signed)
Pt yelling "I need a damn cigarette" Pt trying to get out of bed. RN redirected pt and pt states "give me a damn cigarette. Fuck you and your town. Im not a dog". RN attempted to redirect pt and gave pt water.

## 2022-08-18 DIAGNOSIS — Z0000 Encounter for general adult medical examination without abnormal findings: Secondary | ICD-10-CM | POA: Diagnosis not present

## 2022-08-18 NOTE — ED Notes (Signed)
Pt given dinner tray and beverage  

## 2022-08-18 NOTE — ED Notes (Signed)
Pt ambulated to restroom with assistance due to being drowsy from medications. Pt voided and returned to bed.

## 2022-08-18 NOTE — ED Provider Notes (Signed)
-----------------------------------------   6:18 AM on 08/18/2022 -----------------------------------------   Blood pressure (!) 120/53, pulse (!) 59, temperature 98 F (36.7 C), temperature source Oral, resp. rate 18, height 5\' 6"  (1.676 m), weight 54.4 kg, SpO2 99 %.  The patient is calm and cooperative at this time.  There have been no acute events since the last update.  Awaiting disposition plan from Social Work team.   Irean Hong, MD 08/18/22 [phone]

## 2022-08-18 NOTE — ED Notes (Addendum)
Pt repeatedly asking for me to give her breakfast. This tech told pt that breakfast has not been brought up yet, pt states "ill just go home then. I don't treat you like this"  Pt stating "I need to get out of here. You're not my damn boss. You're a crazy woman. I'll come back tomorrow. Jerrye Noble are some sorry ass people here."

## 2022-08-18 NOTE — ED Notes (Signed)
Pt ambulated to restroom. Pt voided and returned to bed. 

## 2022-08-18 NOTE — ED Notes (Addendum)
Pt getting agitated with other pt constantly yelling out. Pt yelling "that's enough". Pt also stating "I can't stay here tonight" and trying to leave.

## 2022-08-18 NOTE — ED Notes (Signed)
Pt eating breakfast. While eating, pt is asking for food saying she is hungry. Pt redirected to where her food is.

## 2022-08-18 NOTE — ED Notes (Signed)
Pt states she did not eat breakfast. This tech watched pt eat breakfast, Rn threw tray away when pt was finished. Pt asking what she had to eat, this tech told pt. Pt states "no I did not. I wish" Pt very adamant that she did not have anything to eat "all day today"  Pt ambulated on own to restroom in room, urinated in toilet and walked back to the bed complaining of not having pants, however pt has blue scrub pants on at this time. Pt saying " I bought cigarettes so I will use them" pt was advised on not being able to smoke while in the hospital. Pt states "well then I'm getting out of here."

## 2022-08-18 NOTE — ED Notes (Signed)
Pt wondering around in room and going back and forth to door trying to leave stating she needs to go outside and see her husband. Pt not easily redirected. Sitter remains at bedside.

## 2022-08-19 DIAGNOSIS — Z0000 Encounter for general adult medical examination without abnormal findings: Secondary | ICD-10-CM | POA: Diagnosis not present

## 2022-08-19 NOTE — ED Notes (Signed)
Pt consumed 100% of meal tray and beverage provided.

## 2022-08-19 NOTE — ED Notes (Signed)
RN to bedside for rounding. Upon entering room, the pt family at bedside with tech present. RN notified charge nurse of family presence. Charge to bedside with this RN and told family the patient is not allowed to have any visitors. Security present. Family calmly exited room and department.

## 2022-08-19 NOTE — ED Notes (Signed)
Pt showered at this time. Peri care preformed. Clean gown donned. Pt returned to bed and is resting comfortably. Chest rise and fall noted at this time.

## 2022-08-19 NOTE — ED Notes (Addendum)
Family no longer at bedside. 

## 2022-08-19 NOTE — ED Notes (Addendum)
Assumed care from previous sitter Emlie. Pt sleeping. Rise and fall of chest noted at this time.

## 2022-08-19 NOTE — ED Notes (Signed)
Pt appears to be resting in bed at this time. Rise and fall of chest noted. Sitter remains at bedside.

## 2022-08-19 NOTE — ED Notes (Signed)
Assumed care from Abbey,RN. Pt in bed, safety sitter at bedside.

## 2022-08-19 NOTE — ED Notes (Signed)
Family at bedside. 

## 2022-08-19 NOTE — ED Notes (Signed)
Pt up at this time. Pt voided in bed at this time. Pt toileted, new linens, chux, and brief placed at this time.

## 2022-08-20 DIAGNOSIS — Z0000 Encounter for general adult medical examination without abnormal findings: Secondary | ICD-10-CM | POA: Diagnosis not present

## 2022-08-20 NOTE — ED Notes (Signed)
This Rn received a secure chat from Recruitment consultant, that patient is agitated and repeatedly trying to get out of bed even after redirection. This Rn spoke with patient, and patient states that she wants to leave and repeatedly attempts to get out of the bed.

## 2022-08-21 DIAGNOSIS — Z0000 Encounter for general adult medical examination without abnormal findings: Secondary | ICD-10-CM | POA: Diagnosis not present

## 2022-08-21 NOTE — ED Notes (Signed)
Pt appears to be comfortable and resting, can observe even RR that are unlabored, no changes noted, side rails up x2 for safety, Safety sitter remains at bedside, lights on dim to promote rest, NAD noted, no further concerns as of present.

## 2022-08-22 DIAGNOSIS — Z0000 Encounter for general adult medical examination without abnormal findings: Secondary | ICD-10-CM | POA: Diagnosis not present

## 2022-08-22 MED ORDER — TUBERCULIN PPD 5 UNIT/0.1ML ID SOLN
5.0000 [IU] | INTRADERMAL | Status: AC
  Administered 2022-08-23: 5 [IU] via INTRADERMAL
  Filled 2022-08-22: qty 0.1

## 2022-08-22 MED ORDER — NICOTINE 14 MG/24HR TD PT24
14.0000 mg | MEDICATED_PATCH | Freq: Every day | TRANSDERMAL | Status: DC
  Administered 2022-08-22 – 2022-08-30 (×9): 14 mg via TRANSDERMAL
  Filled 2022-08-22 (×10): qty 1

## 2022-08-22 NOTE — ED Notes (Signed)
Patient resting in bed with eyes closed. Resp even, unlabored on RA. Safety sitter at bedside. No distress noted.

## 2022-08-22 NOTE — ED Notes (Signed)
Pt restless and unable to fall asleep. Pt spread her rice from dinner on the ground and poured her cup of water on top of the rice stating she was "watering the seeds."  Clean catch urine specimen collected and sent at this time. Full linen change performed on bed and on pt due to pt spilling water everywhere. Pt positioned in bed and required multiple reminders of where she is, what time it is, and that she has already had dinner. EDT safety sitter remains at bedside.

## 2022-08-22 NOTE — TOC Progression Note (Addendum)
Transition of Care [facility]) - Progression Note    Patient Details  Name: Kaylee Robinson MRN: [id number] Date of Birth: Jan 22, 1946  [facility]) CM/SW Contact  Darleene Cleaver, [place] Phone Number: 08/22/2022, 9:35 AM  Clinical Narrative:     CSW received email from Sharlyne Cai at APS, [facility] can accept patient in their memory care unit, once updated FL2 has been completed.  CSW to update FL2, and email to APS.  TOC to continue to follow.    6:25pm  CSW emailed signed FL2 to Erskine Emery at APS and requested physician to order PPD skin test.  Expected Discharge Plan: Memory Care Barriers to Discharge:  (placement - guardianship)  Expected Discharge Plan and Services    [facility], Memory Care.                                           Social Determinants of Health (SDOH) Interventions SDOH Screenings   Tobacco Use: High Risk (08/11/2022)    Readmission Risk Interventions     No data to display

## 2022-08-22 NOTE — TOC Progression Note (Signed)
Transition of Care [facility]) - Progression Note    Patient Details  Name: Kaylee Robinson MRN: [id number] Date of Birth: 12-06-1945  Transition of Care [facility]) CM/SW Contact  Darleene Cleaver, [place] Phone Number: 08/18/2022, 9:25 AM  Clinical Narrative:     Limmie Patricia reviewing patient per APS.   Expected Discharge Plan: Memory Care Barriers to Discharge:  (placement - guardianship)  Expected Discharge Plan and Services                                               Social Determinants of Health (SDOH) Interventions SDOH Screenings   Tobacco Use: High Risk (08/11/2022)    Readmission Risk Interventions     No data to display

## 2022-08-22 NOTE — TOC Progression Note (Signed)
Transition of Care [facility]) - Progression Note    Patient Details  Name: Kaylee Robinson MRN: [id number] Date of Birth: 1945/04/07  Transition of Care [facility]) CM/SW Contact  Darleene Cleaver, [place] Phone Number: 08/21/2022, 9:23 AM  Clinical Narrative:     Per Milinda Pointer at APS, patient has been assessed by [facility], they are waiting for a decision for patient to be accepted.  Expected Discharge Plan: Memory Care Barriers to Discharge:  (placement - guardianship)  Expected Discharge Plan and Services  [facility] if approved.                                             Social Determinants of Health (SDOH) Interventions SDOH Screenings   Tobacco Use: High Risk (08/11/2022)    Readmission Risk Interventions     No data to display

## 2022-08-22 NOTE — ED Notes (Signed)
Pt agitated, walking around room, stating same 2-3 words over and over. Pt was re-oriented to surroundings. Bed linens changed, pt placed in new gown.

## 2022-08-22 NOTE — ED Provider Notes (Signed)
-----------------------------------------   6:16 AM on 08/22/2022 -----------------------------------------   Blood pressure (!) 137/111, pulse (!) 103, temperature 98.4 F (36.9 C), temperature source Oral, resp. rate 20, height 1.676 m (5\' 6" ), weight 54.4 kg, SpO2 98 %.  The patient is calm and cooperative at this time.  There have been no acute events since the last update.  Awaiting disposition plan from [facility] team.   Loleta Rose, MD 08/22/22 [phone]

## 2022-08-23 DIAGNOSIS — Z0000 Encounter for general adult medical examination without abnormal findings: Secondary | ICD-10-CM | POA: Diagnosis not present

## 2022-08-23 MED ORDER — HALOPERIDOL 5 MG PO TABS
5.0000 mg | ORAL_TABLET | Freq: Once | ORAL | Status: AC | PRN
  Administered 2022-08-23: 5 mg via ORAL
  Filled 2022-08-23: qty 1

## 2022-08-23 MED ORDER — ACETAMINOPHEN 325 MG PO TABS
650.0000 mg | ORAL_TABLET | Freq: Four times a day (QID) | ORAL | Status: DC | PRN
  Administered 2022-08-23 – 2022-08-30 (×3): 650 mg via ORAL
  Filled 2022-08-23 (×3): qty 2

## 2022-08-23 NOTE — ED Notes (Signed)
Pt given lunch tray. Keeps asking about husband and her cigarettes. I wrote down on a piece of paper the answers and taped above sink.

## 2022-08-23 NOTE — ED Notes (Signed)
Pt ate 100% of breakfast at this time.

## 2022-08-23 NOTE — ED Notes (Addendum)
This tech tried taking pt tray because pt was finished and pt smacked this techs arm.  She keeps trying to find her husband and son but I keep telling pt they are at work.

## 2022-08-23 NOTE — ED Provider Notes (Signed)
Emergency Medicine Observation Re-evaluation Note  Kaylee Robinson is a 77 y.o. female, seen on rounds today.  Pt initially presented to the ED for complaints of APS case and placement  Currently, the patient is awake, resting in bed. No reported issues from nursing team.   Physical Exam  BP (!) 156/69   Pulse (!) 51   Temp 98.1 F (36.7 C)   Resp 18   Ht 5\' 6"  (1.676 m)   Wt 54.4 kg   SpO2 98%   BMI 19.37 kg/m  Physical Exam General: Resting comfortably in bed  ED Course / MDM  EKG:EKG Interpretation  Date/Time:  Friday Aug 11 2022 18:50:50 EDT Ventricular Rate:  56 PR Interval:  80 QRS Duration: 102 QT Interval:  434 QTC Calculation: 418 R Axis:   40 Text Interpretation: Sinus bradycardia with short PR ST & T wave abnormality, consider anterolateral ischemia Abnormal ECG When compared with ECG of 19-Sep-2018 22:06, PREVIOUS ECG IS PRESENT Confirmed by UNCONFIRMED, DOCTOR ([id number]), editor Kearney Hard ([id number]) on 08/15/2022 3:44:00 PM  Plan  Current plan is for [facility] disposition.     Trinna Post, MD 08/23/22 [phone]

## 2022-08-23 NOTE — ED Notes (Addendum)
Pt stated that she was hungry. This NT notified nurse. Nurse brought PT a dinner tray Pt has ate 75 % of dinner tray. Pt is now resting in bed asking " where is mike ? "

## 2022-08-23 NOTE — ED Notes (Signed)
Patient resting in bed with eyes closed. Resp even, unlabored on RA. No distress noted at this time. Safety sitter at bedside. 

## 2022-08-23 NOTE — ED Notes (Signed)
Pt walked to bathroom and given a bath by this NT. Pt given new sheets and gown. Pt resting at this time no other needs at this time.

## 2022-08-24 DIAGNOSIS — Z0000 Encounter for general adult medical examination without abnormal findings: Secondary | ICD-10-CM | POA: Diagnosis not present

## 2022-08-24 MED ORDER — LORAZEPAM 0.5 MG PO TABS
0.5000 mg | ORAL_TABLET | Freq: Two times a day (BID) | ORAL | Status: DC | PRN
  Administered 2022-08-24 – 2022-08-30 (×8): 0.5 mg via ORAL
  Filled 2022-08-24 (×8): qty 1

## 2022-08-24 NOTE — ED Notes (Signed)
Pt received meal tray at this time 

## 2022-08-24 NOTE — ED Notes (Signed)
Pt went to the bathroom and soiled her brief in the process of getting there. This EDT helped the pt undress and gave the pt a wash up at the sink. Pt was give a new brief and gown. Pt was then placed back in bed and covered with a blanket. Pt is currently laying in bed. Talking to her( deceased) husband Kathlene November get in here and get in the bed its time to go to sleep.")

## 2022-08-24 NOTE — ED Provider Notes (Signed)
-----------------------------------------   5:10 AM on 08/24/2022 -----------------------------------------   Blood pressure (!) 156/69, pulse (!) 51, temperature 98.1 F (36.7 C), resp. rate 18, height 5\' 6"  (1.676 m), weight 54.4 kg, SpO2 98 %.  The patient is calm and cooperative at this time.  There have been no acute events since the last update.  Awaiting disposition plan from Social Work team.   Irean Hong, MD 08/24/22 [phone]

## 2022-08-24 NOTE — ED Notes (Addendum)
Assumed care of pt Pt sleeping at this time. Per report, pt was medicated and has a sitting at bedside. Pt breathing is unlabored at this time. VS will be obtained when pt wakes

## 2022-08-24 NOTE — ED Notes (Signed)
Patient sleeping. NAD noted. RR even and unlabored

## 2022-08-24 NOTE — ED Notes (Signed)
Pt ambulated to toilet, voided , and returned to bed.

## 2022-08-24 NOTE — ED Notes (Signed)
Pt ambulated to toilet, voided , and returned to bed. 

## 2022-08-24 NOTE — ED Notes (Signed)
Patient sleeping at this time.

## 2022-08-24 NOTE — ED Notes (Signed)
Pt stating "I'm hungry", pt provided with sandwich tray at this time.

## 2022-08-24 NOTE — ED Notes (Signed)
Patient lien changed, and eating lunch at this time.

## 2022-08-24 NOTE — ED Notes (Signed)
Patient sleeping. RR even and unlabored.

## 2022-08-24 NOTE — ED Notes (Signed)
Pt given a cup of water and a pack of graham crackers at this time. 

## 2022-08-24 NOTE — ED Notes (Signed)
Pt does not recall eating the sandwich tray.. Pt repeatedly stating "I'm hungry, get me a cookie". Pt given graham crackers at this time.

## 2022-08-24 NOTE — ED Provider Notes (Signed)
Emergency Medicine Observation Re-evaluation Note  Kaylee Robinson is a 77 y.o. female, seen on rounds today.  Pt initially presented to the ED for complaints of APS case and placement  Currently, the patient is awake, laying in bed. No reported issues from nursing team.   Physical Exam  BP 135/68 (BP Location: Right Arm)   Pulse (!) 58   Temp 98.1 F (36.7 C)   Resp 16   Ht 5\' 6"  (1.676 m)   Wt 54.4 kg   SpO2 98%   BMI 19.37 kg/m  Physical Exam General: Laying comfortably in bed  ED Course / MDM  EKG:EKG Interpretation  Date/Time:  Friday Aug 11 2022 18:50:50 EDT Ventricular Rate:  56 PR Interval:  80 QRS Duration: 102 QT Interval:  434 QTC Calculation: 418 R Axis:   40 Text Interpretation: Sinus bradycardia with short PR ST & T wave abnormality, consider anterolateral ischemia Abnormal ECG When compared with ECG of 19-Sep-2018 22:06, PREVIOUS ECG IS PRESENT Confirmed by UNCONFIRMED, DOCTOR ([id number]), editor Kearney Hard ([id number]) on 08/15/2022 3:44:00 PM  Plan  Current plan is for dispo per social work. Per last TOC note, plan for memory care placement.     Trinna Post, MD 08/24/22 [phone]

## 2022-08-25 DIAGNOSIS — Z0000 Encounter for general adult medical examination without abnormal findings: Secondary | ICD-10-CM | POA: Diagnosis not present

## 2022-08-25 NOTE — ED Notes (Signed)
Pt not compliant with VS

## 2022-08-25 NOTE — TOC Progression Note (Signed)
Transition of Care [facility]) - Progression Note    Patient Details  Name: Kaylee Robinson MRN: [id number] Date of Birth: 1946-01-29  Transition of Care [facility]) CM/SW Contact  Darleene Cleaver, [place] Phone Number: 08/25/2022, 6:15 PM  Clinical Narrative:     CSW emailed DSS to find out update on [facility] ALF placement.  CSW asked bedside nurse if patient's PPD has been read yet since ALF will need the result in order to accept.  TOC to continue to follow patient's progress throughout discharge planning.  Expected Discharge Plan: Memory Care Barriers to Discharge:  (placement - guardianship)  Expected Discharge Plan and Services                                               Social Determinants of Health (SDOH) Interventions SDOH Screenings   Tobacco Use: High Risk (08/11/2022)    Readmission Risk Interventions     No data to display

## 2022-08-25 NOTE — ED Notes (Signed)
PPD results negative

## 2022-08-25 NOTE — ED Notes (Signed)
NT called this RN because pt is getting more agitated and threw a cookie at her roommate. Per tech pt keeps trying to leave and is getting agitated. Will administer PRN Haldol

## 2022-08-25 NOTE — ED Notes (Signed)
Pt's bed linen was changed and the bed was wiped down. Pt was given a full-shower. Pt did take the washcloth and wipe her own face during her shower. Pt has on a fresh change of clothes. Pt is currently in bed eating graham crackers. EDT at bedside.

## 2022-08-25 NOTE — ED Notes (Signed)
Assisted pt to the toilet. Pt is back in bed. No other needs found at this moment.

## 2022-08-25 NOTE — ED Notes (Signed)
Pt stated that " Im hungry, I didn't eat anything today". This NT provided Pt with crackers w/ peanut butter and a cup of Shasta cola. Pt is now in bed eating.

## 2022-08-25 NOTE — ED Notes (Signed)
Pt given breakfast tray

## 2022-08-25 NOTE — ED Notes (Signed)
Assisted pt back to bed from using the bathroom. Clean brief and pants provided.  Drink and snack provided to pt.

## 2022-08-26 DIAGNOSIS — Z0000 Encounter for general adult medical examination without abnormal findings: Secondary | ICD-10-CM | POA: Diagnosis not present

## 2022-08-26 NOTE — ED Notes (Signed)
Pt is still asleep. Pt resting comfortably.

## 2022-08-26 NOTE — ED Notes (Signed)
Pt ambulated to toilet, voided, and returned to bed. 

## 2022-08-26 NOTE — ED Notes (Signed)
Pt ambulated to toilet, voided , and returned to bed. Pt has no other needs at this time.

## 2022-08-26 NOTE — ED Provider Notes (Signed)
-----------------------------------------   5:35 AM on 08/26/2022 -----------------------------------------   Blood pressure 135/68, pulse (!) 58, temperature 98.4 F (36.9 C), temperature source Oral, resp. rate 16, height 5\' 6"  (1.676 m), weight 54.4 kg, SpO2 98 %.  The patient is calm and cooperative at this time.  There have been no acute events since the last update.  Awaiting disposition plan from Social Work team.   Irean Hong, MD 08/26/22 [phone]

## 2022-08-26 NOTE — ED Notes (Signed)
Patient given dinner tray. Patient is eating at this time.

## 2022-08-26 NOTE — ED Notes (Signed)
Pt c/o being hungry. RN brought graham crackers at this time. pt consumed 2 bags of graham crackers. After consuming graham crackers pt stated "I'm hungry." pt reminded of consumption of graham crackers. Pt stated "No, I didn't."

## 2022-08-26 NOTE — ED Notes (Signed)
Pt ambulated to the bathroom at this time. Stand by assistance provided.

## 2022-08-26 NOTE — ED Notes (Addendum)
Pt received meal tray at this time pt consumed 100% of meal tray and beverage offered at this time.

## 2022-08-26 NOTE — ED Notes (Signed)
Pt assisted to toilet after pt climbed out of bd. Pt yelling for "Kathlene November" and is banging on her bed. Pt repeating "Get [place]. Where's Kathlene November. Kathlene November?" Pt not willing to urinate in the toilet at this time. Pt directed back to bed and was positioned for comfort.

## 2022-08-26 NOTE — ED Notes (Signed)
Report received from NT [facility]. Pt sleeping at this time. Chest rise and fall noted.

## 2022-08-26 NOTE — ED Notes (Signed)
Patient ambulated to toilet with standby assistance from this EDT. Patient urinated and then returned to bed.

## 2022-08-26 NOTE — ED Notes (Signed)
Pt got out of bed. Pt wanted to use the bathroom. Pt urinated. Pt is back in bed, resting comfortably.

## 2022-08-26 NOTE — ED Notes (Signed)
Pt ambulated to toilet at this time. Pt voided. Pt ambulated back to bed. Stand by assist. Pt c/o being hungry. Pt reminded of food consumption. Pt stated "I did not have anything to eat all day."

## 2022-08-26 NOTE — ED Notes (Signed)
Pt yelling out loudly the same words and phrases she was yelling earlier. Pt able to be redirected for a short duration of time before pt begins yelling out again.

## 2022-08-26 NOTE — ED Notes (Signed)
Pt yelling at this time. Pt c/o not having any food. This tech reminded pt that she has just finished eating her lunch. Pt denied eating anything and proceeds to yell.

## 2022-08-27 DIAGNOSIS — Z0000 Encounter for general adult medical examination without abnormal findings: Secondary | ICD-10-CM | POA: Diagnosis not present

## 2022-08-27 NOTE — ED Notes (Signed)
This tech took over safety sitting with Pt. Pt is sleeping. Rise and fall of chest noted.

## 2022-08-27 NOTE — ED Provider Notes (Signed)
Emergency Medicine Observation Re-evaluation Note  Physical Exam   BP (!) 172/72   Pulse 62   Temp 97.8 F (36.6 C) (Axillary)   Resp 18   Ht 5\' 6"  (1.676 m)   Wt 54.4 kg   SpO2 97%   BMI 19.37 kg/m   Patient appears in no acute distress.  ED Course / MDM   No reported events during my shift at the time of this note.   Pt is awaiting dispo from SW   Pilar Jarvis MD    Pilar Jarvis, MD 08/27/22 [phone]

## 2022-08-27 NOTE — ED Notes (Signed)
This tech ending safety sitting.

## 2022-08-27 NOTE — ED Notes (Signed)
Pt given graham crackers. No other needs voiced at this time.

## 2022-08-27 NOTE — ED Notes (Signed)
Pt used the bathroom. Pt had a medium bowel movement.

## 2022-08-27 NOTE — ED Notes (Signed)
Set up dinner tray for pt. Pt eating at this time.

## 2022-08-27 NOTE — ED Notes (Signed)
Dietary delivered breakfast tray. Will give to pt when they wake up.

## 2022-08-27 NOTE — ED Notes (Signed)
Pt lunch has arrived. Pt is eating.

## 2022-08-27 NOTE — ED Notes (Signed)
Pt got up to use the bathroom. Pt urinated.

## 2022-08-27 NOTE — ED Notes (Addendum)
PT woke up. Assisted to the toilet to void. PT's brief  and chucks pad on the bed were wet. Pt was given dry bief and chucks pad. Pt eating breakfast. No other needs at this time.

## 2022-08-27 NOTE — ED Notes (Signed)
Pt provided clean gown and combed pt's hair.

## 2022-08-27 NOTE — ED Notes (Signed)
Could not find a previous nicotine patch

## 2022-08-28 DIAGNOSIS — Z0000 Encounter for general adult medical examination without abnormal findings: Secondary | ICD-10-CM | POA: Diagnosis not present

## 2022-08-28 NOTE — ED Notes (Signed)
Patient resting in bed with eyes closed Resp even, unlabored on RA. No distress noted. Sitter at bedside.

## 2022-08-28 NOTE — TOC Progression Note (Signed)
Transition of Care [facility]) - Progression Note    Patient Details  Name: Kaylee Robinson MRN: [id number] Date of Birth: 09/13/1945  Transition of Care [facility]) CM/SW Contact  Darleene Cleaver, [place] Phone Number: 08/28/2022, 5:49 PM  Clinical Narrative:     Patient has a bed at [facility], DSS thought [facility] accepted patient last week.  Per Milinda Pointer at DSS she will reach out to [facility] again to see when they can accept her.   Expected Discharge Plan: Memory Care Barriers to Discharge:  (placement - guardianship)  Expected Discharge Plan and Services                                               Social Determinants of Health (SDOH) Interventions SDOH Screenings   Tobacco Use: High Risk (08/11/2022)    Readmission Risk Interventions     No data to display

## 2022-08-28 NOTE — ED Notes (Signed)
Pt with meal tray eating lunch at this time.

## 2022-08-28 NOTE — ED Provider Notes (Signed)
-----------------------------------------   9:17 PM on 08/28/2022 -----------------------------------------  Blood pressure 113/61, pulse 63, temperature 98 F (36.7 C), temperature source Oral, resp. rate 18, height 5\' 6"  (1.676 m), weight 54.4 kg, SpO2 92 %.  The patient is calm and cooperative at this time.  There have been no acute events since the last update.  Awaiting disposition plan from Behavioral Medicine and/or Social Work team(s).    Dionne Bucy, MD 08/28/22 [phone]

## 2022-08-28 NOTE — ED Notes (Signed)
This NT feed pt applesauce. Pt ate whole container of apple sauce.

## 2022-08-29 DIAGNOSIS — Z0000 Encounter for general adult medical examination without abnormal findings: Secondary | ICD-10-CM | POA: Diagnosis not present

## 2022-08-29 NOTE — TOC Progression Note (Signed)
Transition of Care [facility]) - Progression Note    Patient Details  Name: Kaylee Robinson MRN: [id number] Date of Birth: 09/25/1945  Transition of Care [facility]) CM/SW Contact  Darleene Cleaver, [place] Phone Number: 08/29/2022, 6:08 PM  Clinical Narrative:     CSW attempted to call Junious Silk APS worker following patient to get an update on [facility] placement.  CSW also emailed again the [facility], and the PPD results so they can coordinate with [facility] on a discharge date.  CSW awaiting for communication back from APS worker.  Expected Discharge Plan: Memory Care Barriers to Discharge:  (placement - guardianship)  Expected Discharge Plan and Services                                               Social Determinants of Health (SDOH) Interventions SDOH Screenings   Tobacco Use: High Risk (08/11/2022)    Readmission Risk Interventions     No data to display

## 2022-08-29 NOTE — ED Notes (Signed)
Pt resting in bed at this time. Alert. NAD. Sitter at bedside.

## 2022-08-29 NOTE — ED Notes (Signed)
Pt eating dinner tray at this time with EDT at bedside

## 2022-08-29 NOTE — ED Notes (Signed)
Pt given pepsi and graham crackers. PRN ativan given for increasing agitation

## 2022-08-29 NOTE — ED Notes (Signed)
Pt eating breakfast at this time.  

## 2022-08-29 NOTE — ED Notes (Signed)
Pt Is still eating her dinner. Pt continues to ask for Kathlene November her husband. Pt did get up to use the toilet in the room with assistance. Pt was able to clean herself with no assistance. Pt is corporative

## 2022-08-29 NOTE — ED Notes (Signed)
Patient resting in bed with eyes closed. Resp even, unlabored on RA. No distress noted at this time. Sitter at bedside.

## 2022-08-29 NOTE — ED Notes (Signed)
Pt is currently eating lunch with EDT at bedside.

## 2022-08-30 DIAGNOSIS — Z0000 Encounter for general adult medical examination without abnormal findings: Secondary | ICD-10-CM | POA: Diagnosis not present

## 2022-08-30 NOTE — ED Notes (Signed)
Patient appears to be restless and continues to ambulate to and from the toilet and request food. Patient appears to be worried about her dog and requesting to go home. Patient able to take medications orally.  Hyperverbal at this time. Melatonin given to help with sleep.

## 2022-08-30 NOTE — ED Notes (Signed)
Pt eating dinner at this time

## 2022-08-30 NOTE — ED Notes (Signed)
This EDT and EDT Melanie cleaned up pt and was taken to the toilet. New linens and brief provided at this time. Warm blankets given. No other needs at this time

## 2022-08-30 NOTE — ED Notes (Signed)
Pt ambulated to toilet, voided, and returned to bed. 

## 2022-08-30 NOTE — ED Notes (Signed)
Thi tech is now sitting 1:2 with PT

## 2022-08-30 NOTE — ED Notes (Signed)
Dinner tray given to patient at this time.  

## 2022-08-30 NOTE — ED Notes (Signed)
Pt uncooperative with sitter at bedside. Unable to be redirected.

## 2022-08-30 NOTE — ED Provider Notes (Signed)
-----------------------------------------   6:13 AM on 08/30/2022 -----------------------------------------   Blood pressure 135/70, pulse 70, temperature 97.8 F (36.6 C), temperature source Axillary, resp. rate 16, height 5\' 6"  (1.676 m), weight 54.4 kg, SpO2 94 %.  The patient is calm and cooperative at this time.  There have been no acute events since the last update.  Awaiting disposition plan from Social Work team.   Irean Hong, MD 08/30/22 [phone]

## 2022-08-30 NOTE — ED Notes (Addendum)
Patient is resting comfortably. Chest rising and falling

## 2022-08-30 NOTE — ED Notes (Signed)
Patient said she needed to pee. This EDT assisted patient to the toilet in the patient's room. Patient urinated and had a smear of stool. This EDT cleaned patient and applied a new brief. This EDT also changed the paper chucks and blankets on the patient's bed. Patient returned to bed safely.

## 2022-08-30 NOTE — ED Notes (Signed)
Pt ambulated to toilet, voided, and returned to the bed.

## 2022-08-30 NOTE — ED Notes (Signed)
Patient continues to exit the bed. Sitter at bedside to assist patient.In addition, patient continues to be restless and worried about her "dog" and her family. Attempted diversion failed. Patient to given ativan to help with anxiousness and restlessness.

## 2022-08-30 NOTE — ED Notes (Signed)
Pt given sandwich tray at this time  

## 2022-08-31 DIAGNOSIS — Z0000 Encounter for general adult medical examination without abnormal findings: Secondary | ICD-10-CM | POA: Diagnosis not present

## 2022-08-31 MED ORDER — LORAZEPAM 0.5 MG PO TABS: 0.5000 mg | ORAL_TABLET | Freq: Two times a day (BID) | ORAL | 0 refills | Status: AC | PRN

## 2022-08-31 NOTE — ED Notes (Signed)
Patient lying in bed at this time with eyes closed. Chest rise and fall noted. NAD noted at this time. Safety sitter at bedside. Resp even and non labored.

## 2022-08-31 NOTE — ED Notes (Signed)
Report called to [facility] at [phone] talked with [facility], report also given to DSS, pt from dpt via wc in DSS companionship to be transport to [facility].  When cleaning the room two bags of belongings were found and will be returned to pt.

## 2022-08-31 NOTE — ED Notes (Signed)
[facility] called and updated about pt's belongings and also advised to remove nicotine patch.  Renee verbalized understanding.  Secretary talked with dss and they will pick up pt's belongings and take them to [facility].

## 2022-08-31 NOTE — ED Notes (Signed)
Pt ambulated to bathroom with standby assistance from this tech. Pt back in bed.

## 2022-08-31 NOTE — ED Notes (Signed)
Pt awake at this time. Pt expressed need to void. Pt ambulated to the toilet. Pt voided. Brief changed. New chux pad placed. Pt returned to bed.

## 2022-08-31 NOTE — ED Notes (Signed)
Patient lying in the bed at this time with eyes closed. Chest rise and fall noted. Skin appears pink in color. Resp even and non labored.

## 2022-08-31 NOTE — ED Notes (Addendum)
Patient lying in bed at this time with eyes closed. Chest rise and fall noted. Resp are even and nonlabored.  Skin appears pink in color. Safety sitter present at bedside.

## 2022-08-31 NOTE — ED Provider Notes (Signed)
    08/30/2022    8:14 PM 08/30/2022    8:47 AM 08/29/2022    7:09 PM  Vitals with BMI  Systolic 132 123 161  Diastolic 65 66 70  Pulse 51 75 70    Patient resting comfortably.  There have been no acute events since last update.  She is awaiting disposition by social work team.   Georga Hacking, MD 08/31/22 [phone]

## 2022-08-31 NOTE — TOC Progression Note (Addendum)
Transition of Care [facility]) - Progression Note    Patient Details  Name: Kaylee Robinson MRN: [id number] Date of Birth: 15-Jul-1945  Transition of Care [facility]) CM/SW Contact  Darleene Cleaver, [place] Phone Number: 08/30/2022, 6:47pm  Clinical Narrative:     CSW received email from Erskine Emery at [phone], [facility] has agreed to accept patient.    Social Workers Aundria Rud and Elkins from DSS will be at the hospital on Thursday 6/13 morning at 9:30 am to pick her up and transport her to [facility].    DSS has protective order in place, only speak to them regarding patient's disposition.  Expected Discharge Plan: Memory Care Barriers to Discharge:  (placement - guardianship)  Expected Discharge Plan and Services  [facility].                                             Social Determinants of Health (SDOH) Interventions SDOH Screenings   Tobacco Use: High Risk (08/11/2022)    Readmission Risk Interventions     No data to display

## 2022-08-31 NOTE — ED Notes (Signed)
Patient lying on stretcher with eyes closed. Chest rise and fall noted. Skin appears pink. Resp even and non labored. NAD noted at this time. Safety Sitter at bedside.

## 2022-08-31 NOTE — ED Notes (Signed)
Report given at this time. Pt sleeping. Chest rise and fall noted.

## 2022-08-31 NOTE — ED Provider Notes (Signed)
Emergency Medicine Observation Re-evaluation Note  Physical Exam   BP 139/68 (BP Location: Left Arm)   Pulse (!) 50   Temp 97.7 F (36.5 C) (Oral)   Resp 16   Ht 5\' 6"  (1.676 m)   Wt 54.4 kg   SpO2 100%   BMI 19.37 kg/m    ED Course / MDM  I spoke to social worker's placement pending this morning.  Reviewed latest labs and vital signs as well as notes.  Patient to be discharged later this morning.  Pilar Jarvis MD    Pilar Jarvis, MD 08/31/22 [phone]

## 2022-08-31 NOTE — ED Notes (Addendum)
Patient awaking and oriented to only self at this time. Nad noted. Resp even and unlabored. Skin pwd.  Patient attempting to sit up in the bed. Sitter at bedside.

## 2022-08-31 NOTE — ED Notes (Signed)
Pt ate 100% of breakfast and drank 240 mL of juice.

## 2022-11-20 ENCOUNTER — Emergency Department: Payer: 59

## 2022-11-20 ENCOUNTER — Encounter: Payer: Self-pay | Admitting: Emergency Medicine

## 2022-11-20 ENCOUNTER — Other Ambulatory Visit: Payer: Self-pay

## 2022-11-20 ENCOUNTER — Emergency Department
Admission: EM | Admit: 2022-11-20 | Discharge: 2022-11-20 | Disposition: A | Discharge status: Home / Self Care | Payer: 59 | Attending: Emergency Medicine | Admitting: Emergency Medicine

## 2022-11-20 DIAGNOSIS — M25551 Pain in right hip: Secondary | ICD-10-CM | POA: Insufficient documentation

## 2022-11-20 DIAGNOSIS — Z20822 Contact with and (suspected) exposure to covid-19: Secondary | ICD-10-CM | POA: Diagnosis not present

## 2022-11-20 DIAGNOSIS — G309 Alzheimer's disease, unspecified: Secondary | ICD-10-CM | POA: Diagnosis not present

## 2022-11-20 DIAGNOSIS — M7918 Myalgia, other site: Secondary | ICD-10-CM | POA: Insufficient documentation

## 2022-11-20 DIAGNOSIS — E860 Dehydration: Secondary | ICD-10-CM

## 2022-11-20 LAB — COMPREHENSIVE METABOLIC PANEL
ALT: 21 U/L (ref 0–44)
AST: 40 U/L (ref 15–41)
Albumin: 3.3 g/dL — ABNORMAL LOW (ref 3.5–5.0)
Alkaline Phosphatase: 74 U/L (ref 38–126)
Anion gap: 10 (ref 5–15)
BUN: 31 mg/dL — ABNORMAL HIGH (ref 8–23)
CO2: 27 mmol/L (ref 22–32)
Calcium: 8.6 mg/dL — ABNORMAL LOW (ref 8.9–10.3)
Chloride: 104 mmol/L (ref 98–111)
Creatinine, Ser: 0.83 mg/dL (ref 0.44–1.00)
GFR, Estimated: >60 mL/min (ref >60)
Glucose, Bld: 109 mg/dL — ABNORMAL HIGH (ref 70–99)
Potassium: 4.4 mmol/L (ref 3.5–5.1)
Sodium: 141 mmol/L (ref 135–145)
Total Bilirubin: 1 mg/dL (ref 0.3–1.2)
Total Protein: 6.6 g/dL (ref 6.5–8.1)

## 2022-11-20 LAB — URINALYSIS, W/ REFLEX TO CULTURE (INFECTION SUSPECTED)
Glucose, UA: NEGATIVE mg/dL
Hgb urine dipstick: NEGATIVE
Leukocytes,Ua: NEGATIVE
Nitrite: NEGATIVE
Protein, ur: 30 mg/dL — AB
Specific Gravity, Urine: 1.025 (ref 1.005–1.030)
pH: 6 (ref 5.0–8.0)

## 2022-11-20 LAB — CBC WITH DIFFERENTIAL/PLATELET
Abs Immature Granulocytes: 0.03 10*3/uL (ref 0.00–0.07)
Basophils Absolute: 0 10*3/uL (ref 0.0–0.1)
Basophils Relative: 0 %
Eosinophils Absolute: 0 10*3/uL (ref 0.0–0.5)
Eosinophils Relative: 0 %
HCT: 41.5 % (ref 36.0–46.0)
Hemoglobin: 13.1 g/dL (ref 12.0–15.0)
Immature Granulocytes: 0 %
Lymphocytes Relative: 14 %
Lymphs Abs: 1.5 10*3/uL (ref 0.7–4.0)
MCH: 29.4 pg (ref 26.0–34.0)
MCHC: 31.6 g/dL (ref 30.0–36.0)
MCV: 93.3 fL (ref 80.0–100.0)
Monocytes Absolute: 1.3 10*3/uL — ABNORMAL HIGH (ref 0.1–1.0)
Monocytes Relative: 12 %
Neutro Abs: 8 10*3/uL — ABNORMAL HIGH (ref 1.7–7.7)
Neutrophils Relative %: 74 %
Platelets: 273 10*3/uL (ref 150–400)
RBC: 4.45 MIL/uL (ref 3.87–5.11)
RDW: 11.8 % (ref 11.5–15.5)
WBC: 10.9 10*3/uL — ABNORMAL HIGH (ref 4.0–10.5)
nRBC: 0 % (ref 0.0–0.2)

## 2022-11-20 LAB — SARS CORONAVIRUS 2 BY RT PCR: SARS Coronavirus 2 by RT PCR: NEGATIVE

## 2022-11-20 LAB — LACTIC ACID, PLASMA: Lactic Acid, Venous: 1.7 mmol/L (ref 0.5–1.9)

## 2022-11-20 LAB — POC URINE PREG, ED: Preg Test, Ur: NEGATIVE

## 2022-11-20 LAB — LIPASE, BLOOD: Lipase: 35 U/L (ref 11–51)

## 2022-11-20 MED ORDER — SODIUM CHLORIDE 0.9 % IV BOLUS
1000.0000 mL | Freq: Once | INTRAVENOUS | Status: AC
  Administered 2022-11-20: 1000 mL via INTRAVENOUS

## 2022-11-20 NOTE — ED Provider Notes (Addendum)
[facility] Provider Note    Event Date/Time   First MD Initiated Contact with Patient 11/20/22 [phone]     (approximate)   History   Generalized Body Aches   HPI  Kaylee Robinson is a 77 y.o. female with a past medical history of Alzheimer's who presents today via EMS for evaluation of right hip pain.  Patient is unable to provide any history, rambles, and then says she is cold.  Currently denies pain.  I spoke with her nurse Judeth Cornfield at [facility] who reports that she called EMS today because when Veblen went to wake the patient up the patient was complaining of right hip pain and right wrist pain.  Judeth Cornfield reports that she would not bear weight on her right hip.  She reports that her mental status is unchanged today although she does not normally complain of pain and today she is saying "oh God it hurts."  No fever.  No known falls, and Judeth Cornfield reports that she was in her bed when she went to get her up this morning, however Judeth Cornfield is unsure how she was last night because she just got back from vacation.  RN Bonita Quin also spoke with patient's nephew who reports that she hollers "all the time."  Patient Active Problem List   Diagnosis Date Noted   Closed left hip fracture (HCC) 09/20/2018          Physical Exam   Triage Vital Signs: ED Triage Vitals  Encounter Vitals Group     BP 11/20/22 0924 (!) 147/74     Systolic BP Percentile --      Diastolic BP Percentile --      Pulse Rate 11/20/22 0924 64     Resp 11/20/22 0924 18     Temp 11/20/22 0924 98.2 F (36.8 C)     Temp Source 11/20/22 0924 Axillary     SpO2 11/20/22 0924 99 %     Weight 11/20/22 0929 121 lb 4.1 oz (55 kg)     Height 11/20/22 0929 5\' 6"  (1.676 m)     Head Circumference --      Peak Flow --      Pain Score --      Pain Loc --      Pain Education --      Exclude from Growth Chart --     Most recent vital signs: Vitals:   11/20/22 0924 11/20/22 1325  BP: (!)  147/74 (!) 140/70  Pulse: 64 65  Resp: 18 18  Temp: 98.2 F (36.8 C) (!) 97.5 F (36.4 C)  SpO2: 99% 99%    Physical Exam Vitals and nursing note reviewed.  Constitutional:      General: Awake and alert. No acute distress.  Chronically ill-appearing    Appearance: Normal appearance. The patient is normal weight.  HENT:     Head: Normocephalic and atraumatic.     Mouth: Mucous membranes are dry Eyes:     General: PERRL. Normal EOMs        Right eye: No discharge.        Left eye: No discharge.     Conjunctiva/sclera: Conjunctivae normal.  Cardiovascular:     Rate and Rhythm: Normal rate    Pulses: Normal pulses.  Pulmonary:     Effort: Pulmonary effort is normal. No respiratory distress.     Breath sounds: Normal breath sounds.  No chest wall tenderness or ecchymosis noted. Abdominal:     Abdomen is  soft. There is no abdominal tenderness. No rebound or guarding. No distention.  No ecchymosis Musculoskeletal:        General: No swelling. Normal range of motion.     Cervical back: Normal range of motion and neck supple.  Normal range of motion of bilateral upper extremities, no wrist tenderness, normal grip strength. Negative logroll the hips bilaterally, does not wince when I lift both of her legs up off of the stretcher. Mild right wrist tenderness without obvious deformity, erythema, ecchymosis, or open wounds.  Able to squeeze my hands. Skin:    General: Skin is warm and dry.     Capillary Refill: Capillary refill takes less than 2 seconds.     Findings: No rash, ecchymosis, or erythema noted Neurological:     Mental Status: The patient is awake and alert.  At her mental baseline per EMS and per Judeth Cornfield at [facility]     ED Results / Procedures / Treatments   Labs (all labs ordered are listed, but only abnormal results are displayed) Labs Reviewed  CBC WITH DIFFERENTIAL/PLATELET - Abnormal; Notable for the following components:      Result Value   WBC 10.9  (*)    Neutro Abs 8.0 (*)    Monocytes Absolute 1.3 (*)    All other components within normal limits  COMPREHENSIVE METABOLIC PANEL - Abnormal; Notable for the following components:   Glucose, Bld 109 (*)    BUN 31 (*)    Calcium 8.6 (*)    Albumin 3.3 (*)    All other components within normal limits  URINALYSIS, W/ REFLEX TO CULTURE (INFECTION SUSPECTED) - Abnormal; Notable for the following components:   Bilirubin Urine SMALL (*)    Ketones, ur TRACE (*)    Protein, ur 30 (*)    Bacteria, UA RARE (*)    All other components within normal limits  SARS CORONAVIRUS 2 BY RT PCR  LACTIC ACID, PLASMA  LIPASE, BLOOD  POC URINE PREG, ED     EKG     RADIOLOGY I independently reviewed and interpreted imaging and agree with radiologists findings.     PROCEDURES:  Critical Care performed:   Procedures   MEDICATIONS ORDERED IN ED: Medications  sodium chloride 0.9 % bolus 1,000 mL (0 mLs Intravenous Stopped 11/20/22 1324)     IMPRESSION / MDM / ASSESSMENT AND PLAN / ED COURSE  I reviewed the triage vital signs and the nursing notes.   Differential diagnosis includes, but is not limited to, infection, fracture, COVID, electrolyte disarray, dehydration.  Patient is awake and alert, hemodynamically stable and afebrile.  I discussed her history and presentation with Judeth Cornfield, her nurse at the [facility].  Given poor historian and lack of clear history, patient underwent broad testing including imaging, laboratory testing, COVID testing and urinalysis.  Patient has no pain with passive range of motion of her shoulders, elbows, wrists, hips, knees, ankles.  Labs are overall reassuring, negative lactate.  Her BUN to creatinine is suggestive of prerenal dehydration, and therefore she was hydrated with 1 L of normal saline.  She became much more alert after this, talkative, denies any pain.  Her vital signs have remained normal throughout her entire emergency department stay.   Urinalysis is not suggestive of infection.  X-rays of her hips revealed no acute findings, though x-ray of her wrist reveals old healing distal radius fracture.  Patient was moved from stretcher to CT scanner, to x-ray, and also rolled for urine sample,  declined having any pain.  CT head and neck were normal as well.  COVID was negative.  Patient was reevaluated several times, declines having any pain, tells me she would like to go to work because it is a Monday, consistent with her dementia.  Findings were discussed with the nurse by the RN.  I also discussed with the patient who reports that she is feeling significantly improved.  We discussed return precautions and outpatient follow-up.  Patient was discharged in stable condition.  She was transported back to [facility] by EMS.  Follow up: Received call from [place] from the [facility] who again reports that the patient is complaining of pain.  I discussed with her the reassuring workup today but advised that she is welcome to bring her back for further evaluation if needed.   Patient's presentation is most consistent with acute complicated illness / injury requiring diagnostic workup.   Clinical Course as of 11/20/22 1456  Mon Nov 20, 2022  1310 Patient reports that she feels significantly improved, able to move all extremities normally [JP]    Clinical Course User Index [JP] Keyvon Herter, Herb Grays, PA-C     FINAL CLINICAL IMPRESSION(S) / ED DIAGNOSES   Final diagnoses:  Dehydration     Rx / DC Orders   ED Discharge Orders     None        Note:  This document was prepared using Dragon voice recognition software and may include unintentional dictation errors.   Jackelyn Hoehn, PA-C 11/20/22 1422    PoggiHerb Grays, PA-C 11/20/22 1457    Pilar Jarvis, MD 11/20/22 [phone]

## 2022-11-20 NOTE — ED Triage Notes (Signed)
Presents via EMS from [facility]  Per staff they informed EMS that she did not have a fall  But was having some right hip and wrist pain with pain/swelling to left ankle  Subjective fever at scene  On arrival to ED she is complaining of neck pain and headache

## 2022-11-20 NOTE — ED Notes (Signed)
Family was at bedside, patient's nephew and his wife. Patient was easily awakened, recognized her nephew and his wife and was able to answer questions appropriately. Patient has garbled speech, but can be understood.

## 2022-11-20 NOTE — Discharge Instructions (Signed)
Your blood work, urinalysis, x-rays, and CT scans are reassuring today.  You were found to be slightly dehydrated and you were hydrated with IV fluids.  Please follow-up with your outpatient provider.  Please return for any new, worsening, or change in symptoms or other concerns.  It was a pleasure caring for you today.

## 2022-12-06 ENCOUNTER — Emergency Department: Payer: 59

## 2022-12-06 ENCOUNTER — Other Ambulatory Visit: Payer: Self-pay

## 2022-12-06 DIAGNOSIS — Z886 Allergy status to analgesic agent status: Secondary | ICD-10-CM

## 2022-12-06 DIAGNOSIS — F02C3 Dementia in other diseases classified elsewhere, severe, with mood disturbance: Secondary | ICD-10-CM | POA: Diagnosis present

## 2022-12-06 DIAGNOSIS — M170 Bilateral primary osteoarthritis of knee: Secondary | ICD-10-CM | POA: Diagnosis present

## 2022-12-06 DIAGNOSIS — Z880 Allergy status to penicillin: Secondary | ICD-10-CM

## 2022-12-06 DIAGNOSIS — Y939 Activity, unspecified: Secondary | ICD-10-CM

## 2022-12-06 DIAGNOSIS — F0284 Dementia in other diseases classified elsewhere, unspecified severity, with anxiety: Secondary | ICD-10-CM | POA: Diagnosis present

## 2022-12-06 DIAGNOSIS — Z882 Allergy status to sulfonamides status: Secondary | ICD-10-CM

## 2022-12-06 DIAGNOSIS — S60221A Contusion of right hand, initial encounter: Secondary | ICD-10-CM | POA: Diagnosis present

## 2022-12-06 DIAGNOSIS — S72001A Fracture of unspecified part of neck of right femur, initial encounter for closed fracture: Principal | ICD-10-CM | POA: Diagnosis present

## 2022-12-06 DIAGNOSIS — F419 Anxiety disorder, unspecified: Secondary | ICD-10-CM | POA: Diagnosis present

## 2022-12-06 DIAGNOSIS — M25551 Pain in right hip: Secondary | ICD-10-CM | POA: Diagnosis not present

## 2022-12-06 DIAGNOSIS — R9431 Abnormal electrocardiogram [ECG] [EKG]: Secondary | ICD-10-CM | POA: Diagnosis present

## 2022-12-06 DIAGNOSIS — Z681 Body mass index (BMI) 19 or less, adult: Secondary | ICD-10-CM

## 2022-12-06 DIAGNOSIS — F02C18 Dementia in other diseases classified elsewhere, severe, with other behavioral disturbance: Secondary | ICD-10-CM | POA: Diagnosis present

## 2022-12-06 DIAGNOSIS — F0154 Vascular dementia, unspecified severity, with anxiety: Secondary | ICD-10-CM | POA: Diagnosis present

## 2022-12-06 DIAGNOSIS — Z23 Encounter for immunization: Secondary | ICD-10-CM

## 2022-12-06 DIAGNOSIS — F17210 Nicotine dependence, cigarettes, uncomplicated: Secondary | ICD-10-CM | POA: Diagnosis present

## 2022-12-06 DIAGNOSIS — F32A Depression, unspecified: Secondary | ICD-10-CM | POA: Diagnosis present

## 2022-12-06 DIAGNOSIS — G309 Alzheimer's disease, unspecified: Secondary | ICD-10-CM | POA: Diagnosis present

## 2022-12-06 DIAGNOSIS — E43 Unspecified severe protein-calorie malnutrition: Secondary | ICD-10-CM | POA: Diagnosis present

## 2022-12-06 DIAGNOSIS — D62 Acute posthemorrhagic anemia: Secondary | ICD-10-CM | POA: Diagnosis not present

## 2022-12-06 DIAGNOSIS — I10 Essential (primary) hypertension: Secondary | ICD-10-CM | POA: Diagnosis present

## 2022-12-06 DIAGNOSIS — W050XXA Fall from non-moving wheelchair, initial encounter: Secondary | ICD-10-CM | POA: Diagnosis present

## 2022-12-06 DIAGNOSIS — F01C3 Vascular dementia, severe, with mood disturbance: Secondary | ICD-10-CM | POA: Diagnosis present

## 2022-12-06 DIAGNOSIS — Z888 Allergy status to other drugs, medicaments and biological substances status: Secondary | ICD-10-CM

## 2022-12-06 NOTE — ED Triage Notes (Addendum)
BIB AEMS from [facility]. Witnessed fall at facility. Did not hit head or experience LOC. No daily thinners. C/o bilateral hip pain. Landed on R hip and hx of prior fx to R hip. Slight external rotation noted without shortening. Hx of dementia. Pt at baseline. Breathing unlabored.   EMS VS:  188/89 93% RA HR 70 RR 18  Reports fall while attempting to get out of wheelchair. Pt oriented and following commands.

## 2022-12-07 ENCOUNTER — Inpatient Hospital Stay
Admission: EM | Admit: 2022-12-07 | Discharge: 2022-12-13 | DRG: 521 | Disposition: A | Discharge status: Skilled Nursing Facility | Payer: 59 | Attending: Hospitalist | Admitting: Hospitalist

## 2022-12-07 ENCOUNTER — Inpatient Hospital Stay: Payer: 59

## 2022-12-07 ENCOUNTER — Emergency Department: Payer: 59

## 2022-12-07 ENCOUNTER — Encounter
Admission: EM | Disposition: A | Discharge status: Skilled Nursing Facility | Payer: Self-pay | Source: Home / Self Care | Attending: Internal Medicine

## 2022-12-07 ENCOUNTER — Inpatient Hospital Stay: Payer: 59 | Admitting: Anesthesiology

## 2022-12-07 ENCOUNTER — Encounter: Payer: Self-pay | Admitting: Family Medicine

## 2022-12-07 ENCOUNTER — Other Ambulatory Visit: Payer: Self-pay

## 2022-12-07 DIAGNOSIS — F32A Depression, unspecified: Secondary | ICD-10-CM | POA: Diagnosis present

## 2022-12-07 DIAGNOSIS — Y939 Activity, unspecified: Secondary | ICD-10-CM | POA: Diagnosis not present

## 2022-12-07 DIAGNOSIS — F17210 Nicotine dependence, cigarettes, uncomplicated: Secondary | ICD-10-CM | POA: Diagnosis present

## 2022-12-07 DIAGNOSIS — F0284 Dementia in other diseases classified elsewhere, unspecified severity, with anxiety: Secondary | ICD-10-CM | POA: Diagnosis present

## 2022-12-07 DIAGNOSIS — M25551 Pain in right hip: Secondary | ICD-10-CM | POA: Diagnosis present

## 2022-12-07 DIAGNOSIS — E43 Unspecified severe protein-calorie malnutrition: Secondary | ICD-10-CM | POA: Diagnosis present

## 2022-12-07 DIAGNOSIS — D62 Acute posthemorrhagic anemia: Secondary | ICD-10-CM | POA: Diagnosis not present

## 2022-12-07 DIAGNOSIS — Z888 Allergy status to other drugs, medicaments and biological substances status: Secondary | ICD-10-CM | POA: Diagnosis not present

## 2022-12-07 DIAGNOSIS — Z681 Body mass index (BMI) 19 or less, adult: Secondary | ICD-10-CM | POA: Diagnosis not present

## 2022-12-07 DIAGNOSIS — F03918 Unspecified dementia, unspecified severity, with other behavioral disturbance: Secondary | ICD-10-CM | POA: Diagnosis not present

## 2022-12-07 DIAGNOSIS — F419 Anxiety disorder, unspecified: Secondary | ICD-10-CM

## 2022-12-07 DIAGNOSIS — Z882 Allergy status to sulfonamides status: Secondary | ICD-10-CM | POA: Diagnosis not present

## 2022-12-07 DIAGNOSIS — S72001A Fracture of unspecified part of neck of right femur, initial encounter for closed fracture: Secondary | ICD-10-CM | POA: Diagnosis present

## 2022-12-07 DIAGNOSIS — F02C3 Dementia in other diseases classified elsewhere, severe, with mood disturbance: Secondary | ICD-10-CM | POA: Diagnosis present

## 2022-12-07 DIAGNOSIS — M170 Bilateral primary osteoarthritis of knee: Secondary | ICD-10-CM | POA: Diagnosis present

## 2022-12-07 DIAGNOSIS — W050XXA Fall from non-moving wheelchair, initial encounter: Secondary | ICD-10-CM | POA: Diagnosis present

## 2022-12-07 DIAGNOSIS — F01C3 Vascular dementia, severe, with mood disturbance: Secondary | ICD-10-CM | POA: Diagnosis present

## 2022-12-07 DIAGNOSIS — Z886 Allergy status to analgesic agent status: Secondary | ICD-10-CM | POA: Diagnosis not present

## 2022-12-07 DIAGNOSIS — I10 Essential (primary) hypertension: Secondary | ICD-10-CM | POA: Diagnosis present

## 2022-12-07 DIAGNOSIS — Z880 Allergy status to penicillin: Secondary | ICD-10-CM | POA: Diagnosis not present

## 2022-12-07 DIAGNOSIS — F0154 Vascular dementia, unspecified severity, with anxiety: Secondary | ICD-10-CM | POA: Diagnosis present

## 2022-12-07 DIAGNOSIS — R9431 Abnormal electrocardiogram [ECG] [EKG]: Secondary | ICD-10-CM | POA: Diagnosis present

## 2022-12-07 DIAGNOSIS — G309 Alzheimer's disease, unspecified: Secondary | ICD-10-CM | POA: Diagnosis present

## 2022-12-07 DIAGNOSIS — F02C18 Dementia in other diseases classified elsewhere, severe, with other behavioral disturbance: Secondary | ICD-10-CM | POA: Diagnosis present

## 2022-12-07 DIAGNOSIS — T148XXA Other injury of unspecified body region, initial encounter: Secondary | ICD-10-CM

## 2022-12-07 DIAGNOSIS — S60221A Contusion of right hand, initial encounter: Secondary | ICD-10-CM | POA: Diagnosis present

## 2022-12-07 DIAGNOSIS — Z23 Encounter for immunization: Secondary | ICD-10-CM | POA: Diagnosis present

## 2022-12-07 HISTORY — PX: HIP ARTHROPLASTY: SHX981

## 2022-12-07 LAB — CBC WITH DIFFERENTIAL/PLATELET
Abs Immature Granulocytes: 0.08 10*3/uL — ABNORMAL HIGH (ref 0.00–0.07)
Basophils Absolute: 0 10*3/uL (ref 0.0–0.1)
Basophils Relative: 0 %
Eosinophils Absolute: 0 10*3/uL (ref 0.0–0.5)
Eosinophils Relative: 0 %
HCT: 36.7 % (ref 36.0–46.0)
Hemoglobin: 11.7 g/dL — ABNORMAL LOW (ref 12.0–15.0)
Immature Granulocytes: 1 %
Lymphocytes Relative: 7 %
Lymphs Abs: 1 10*3/uL (ref 0.7–4.0)
MCH: 29.4 pg (ref 26.0–34.0)
MCHC: 31.9 g/dL (ref 30.0–36.0)
MCV: 92.2 fL (ref 80.0–100.0)
Monocytes Absolute: 1.1 10*3/uL — ABNORMAL HIGH (ref 0.1–1.0)
Monocytes Relative: 8 %
Neutro Abs: 11.6 10*3/uL — ABNORMAL HIGH (ref 1.7–7.7)
Neutrophils Relative %: 84 %
Platelets: 258 10*3/uL (ref 150–400)
RBC: 3.98 MIL/uL (ref 3.87–5.11)
RDW: 12.1 % (ref 11.5–15.5)
WBC: 13.8 10*3/uL — ABNORMAL HIGH (ref 4.0–10.5)
nRBC: 0 % (ref 0.0–0.2)

## 2022-12-07 LAB — BASIC METABOLIC PANEL
Anion gap: 9 (ref 5–15)
Anion gap: 10 (ref 5–15)
BUN: 24 mg/dL — ABNORMAL HIGH (ref 8–23)
BUN: 23 mg/dL (ref 8–23)
CO2: 25 mmol/L (ref 22–32)
CO2: 24 mmol/L (ref 22–32)
Calcium: 8.1 mg/dL — ABNORMAL LOW (ref 8.9–10.3)
Calcium: 8.6 mg/dL — ABNORMAL LOW (ref 8.9–10.3)
Chloride: 103 mmol/L (ref 98–111)
Chloride: 103 mmol/L (ref 98–111)
Creatinine, Ser: 0.67 mg/dL (ref 0.44–1.00)
Creatinine, Ser: 0.65 mg/dL (ref 0.44–1.00)
GFR, Estimated: >60 mL/min (ref >60)
GFR, Estimated: >60 mL/min (ref >60)
Glucose, Bld: 116 mg/dL — ABNORMAL HIGH (ref 70–99)
Glucose, Bld: 122 mg/dL — ABNORMAL HIGH (ref 70–99)
Potassium: 4.3 mmol/L (ref 3.5–5.1)
Potassium: 4.2 mmol/L (ref 3.5–5.1)
Sodium: 137 mmol/L (ref 135–145)
Sodium: 137 mmol/L (ref 135–145)

## 2022-12-07 LAB — CBC
HCT: 36.4 % (ref 36.0–46.0)
Hemoglobin: 11.7 g/dL — ABNORMAL LOW (ref 12.0–15.0)
MCH: 29.3 pg (ref 26.0–34.0)
MCHC: 32.1 g/dL (ref 30.0–36.0)
MCV: 91.2 fL (ref 80.0–100.0)
Platelets: 265 10*3/uL (ref 150–400)
RBC: 3.99 MIL/uL (ref 3.87–5.11)
RDW: 12.1 % (ref 11.5–15.5)
WBC: 12.4 10*3/uL — ABNORMAL HIGH (ref 4.0–10.5)
nRBC: 0 % (ref 0.0–0.2)

## 2022-12-07 LAB — PROTIME-INR
INR: 1.1 (ref 0.8–1.2)
Prothrombin Time: 14.3 seconds (ref 11.4–15.2)

## 2022-12-07 LAB — TYPE AND SCREEN
ABO/RH(D): B POS
Antibody Screen: NEGATIVE

## 2022-12-07 LAB — VALPROIC ACID LEVEL: Valproic Acid Lvl: 29 ug/mL — ABNORMAL LOW (ref 50.0–100.0)

## 2022-12-07 SURGERY — HEMIARTHROPLASTY, HIP, DIRECT ANTERIOR APPROACH, FOR FRACTURE
Anesthesia: General | Site: Hip | Laterality: Right

## 2022-12-07 MED ORDER — SODIUM CHLORIDE FLUSH 0.9 % IV SOLN
INTRAVENOUS | Status: AC
  Filled 2022-12-07: qty 40

## 2022-12-07 MED ORDER — TRAZODONE HCL 50 MG PO TABS
25.0000 mg | ORAL_TABLET | Freq: Every evening | ORAL | Status: DC | PRN
  Administered 2022-12-09 – 2022-12-12 (×4): 25 mg via ORAL
  Filled 2022-12-07 (×4): qty 1

## 2022-12-07 MED ORDER — LACTATED RINGERS IV SOLN: INTRAVENOUS | Status: DC

## 2022-12-07 MED ORDER — STERILE WATER FOR IRRIGATION IR SOLN
Status: DC | PRN
Start: 2022-12-07 — End: 2022-12-07
  Administered 2022-12-07: 1000 mL

## 2022-12-07 MED ORDER — TETANUS-DIPHTH-ACELL PERTUSSIS 5-2.5-18.5 LF-MCG/0.5 IM SUSY
0.5000 mL | PREFILLED_SYRINGE | Freq: Once | INTRAMUSCULAR | Status: AC
  Administered 2022-12-07: 0.5 mL via INTRAMUSCULAR
  Filled 2022-12-07: qty 0.5

## 2022-12-07 MED ORDER — FENTANYL CITRATE (PF) 100 MCG/2ML IJ SOLN: 25.0000 ug | INTRAMUSCULAR | Status: DC | PRN

## 2022-12-07 MED ORDER — MEMANTINE HCL 5 MG PO TABS
5.0000 mg | ORAL_TABLET | Freq: Two times a day (BID) | ORAL | Status: DC
  Administered 2022-12-07 – 2022-12-13 (×11): 5 mg via ORAL
  Filled 2022-12-07 (×12): qty 1

## 2022-12-07 MED ORDER — TRANEXAMIC ACID-NACL 1000-0.7 MG/100ML-% IV SOLN
1000.0000 mg | INTRAVENOUS | Status: AC
  Administered 2022-12-07: 1000 mg via INTRAVENOUS

## 2022-12-07 MED ORDER — PROPOFOL 10 MG/ML IV BOLUS
INTRAVENOUS | Status: DC | PRN
  Administered 2022-12-07: 80 mg via INTRAVENOUS

## 2022-12-07 MED ORDER — PHENYLEPHRINE HCL (PRESSORS) 10 MG/ML IV SOLN
INTRAVENOUS | Status: AC
  Filled 2022-12-07: qty 1

## 2022-12-07 MED ORDER — PHENYLEPHRINE HCL-NACL 20-0.9 MG/250ML-% IV SOLN
INTRAVENOUS | Status: AC
  Filled 2022-12-07: qty 250

## 2022-12-07 MED ORDER — ONDANSETRON HCL 4 MG/2ML IJ SOLN
4.0000 mg | Freq: Once | INTRAMUSCULAR | Status: AC
  Administered 2022-12-07: 4 mg via INTRAVENOUS
  Filled 2022-12-07: qty 2

## 2022-12-07 MED ORDER — DOCUSATE SODIUM 100 MG PO CAPS
100.0000 mg | ORAL_CAPSULE | Freq: Two times a day (BID) | ORAL | Status: DC
  Administered 2022-12-07 – 2022-12-13 (×11): 100 mg via ORAL
  Filled 2022-12-07 (×11): qty 1

## 2022-12-07 MED ORDER — TRANEXAMIC ACID-NACL 1000-0.7 MG/100ML-% IV SOLN
INTRAVENOUS | Status: AC
  Filled 2022-12-07: qty 100

## 2022-12-07 MED ORDER — SODIUM CHLORIDE 0.9 % IV SOLN: INTRAVENOUS | Status: DC

## 2022-12-07 MED ORDER — EPHEDRINE SULFATE (PRESSORS) 50 MG/ML IJ SOLN
INTRAMUSCULAR | Status: DC | PRN
  Administered 2022-12-07 (×2): 5 mg via INTRAVENOUS

## 2022-12-07 MED ORDER — ORAL CARE MOUTH RINSE: 15.0000 mL | Freq: Once | OROMUCOSAL | Status: AC

## 2022-12-07 MED ORDER — TRIAMCINOLONE ACETONIDE 40 MG/ML IJ SUSP
INTRAMUSCULAR | Status: AC
  Filled 2022-12-07: qty 2

## 2022-12-07 MED ORDER — ROCURONIUM BROMIDE 100 MG/10ML IV SOLN
INTRAVENOUS | Status: DC | PRN
  Administered 2022-12-07: 30 mg via INTRAVENOUS
  Administered 2022-12-07: 50 mg via INTRAVENOUS

## 2022-12-07 MED ORDER — ACETAMINOPHEN 325 MG PO TABS: 650.0000 mg | ORAL_TABLET | ORAL | Status: DC | PRN

## 2022-12-07 MED ORDER — ENSURE ENLIVE PO LIQD
237.0000 mL | Freq: Three times a day (TID) | ORAL | Status: DC
  Administered 2022-12-08 – 2022-12-13 (×15): 237 mL via ORAL

## 2022-12-07 MED ORDER — FENTANYL CITRATE PF 50 MCG/ML IJ SOSY
50.0000 ug | PREFILLED_SYRINGE | Freq: Once | INTRAMUSCULAR | Status: AC
  Administered 2022-12-07: 50 ug via INTRAVENOUS
  Filled 2022-12-07: qty 1

## 2022-12-07 MED ORDER — PHENYLEPHRINE HCL-NACL 20-0.9 MG/250ML-% IV SOLN
INTRAVENOUS | Status: DC | PRN
Start: 2022-12-07 — End: 2022-12-07
  Administered 2022-12-07 (×2): 80 ug via INTRAVENOUS
  Administered 2022-12-07: 50 ug/min via INTRAVENOUS

## 2022-12-07 MED ORDER — MELATONIN 5 MG PO TABS
5.0000 mg | ORAL_TABLET | Freq: Every evening | ORAL | Status: DC | PRN
  Administered 2022-12-10 – 2022-12-12 (×3): 5 mg via ORAL
  Filled 2022-12-07 (×3): qty 1

## 2022-12-07 MED ORDER — ACETAMINOPHEN 325 MG PO TABS
325.0000 mg | ORAL_TABLET | Freq: Four times a day (QID) | ORAL | Status: DC | PRN
  Administered 2022-12-11: 650 mg via ORAL
  Filled 2022-12-07: qty 2

## 2022-12-07 MED ORDER — DEXAMETHASONE SODIUM PHOSPHATE 10 MG/ML IJ SOLN
INTRAMUSCULAR | Status: DC | PRN
  Administered 2022-12-07: 10 mg via INTRAVENOUS

## 2022-12-07 MED ORDER — FERROUS SULFATE 325 (65 FE) MG PO TABS
325.0000 mg | ORAL_TABLET | Freq: Three times a day (TID) | ORAL | Status: DC
  Administered 2022-12-08 – 2022-12-13 (×17): 325 mg via ORAL
  Filled 2022-12-07 (×17): qty 1

## 2022-12-07 MED ORDER — BUPIVACAINE HCL (PF) 0.5 % IJ SOLN
INTRAMUSCULAR | Status: AC
  Filled 2022-12-07: qty 30

## 2022-12-07 MED ORDER — EPINEPHRINE PF 1 MG/ML IJ SOLN
INTRAMUSCULAR | Status: AC
  Filled 2022-12-07: qty 1

## 2022-12-07 MED ORDER — LIDOCAINE HCL (CARDIAC) PF 100 MG/5ML IV SOSY
PREFILLED_SYRINGE | INTRAVENOUS | Status: DC | PRN
  Administered 2022-12-07: 40 mg via INTRAVENOUS

## 2022-12-07 MED ORDER — ADULT MULTIVITAMIN W/MINERALS CH
1.0000 | ORAL_TABLET | Freq: Every day | ORAL | Status: DC
  Administered 2022-12-08 – 2022-12-13 (×6): 1 via ORAL
  Filled 2022-12-07 (×6): qty 1

## 2022-12-07 MED ORDER — CHLORHEXIDINE GLUCONATE 0.12 % MT SOLN
OROMUCOSAL | Status: AC
  Filled 2022-12-07: qty 15

## 2022-12-07 MED ORDER — BISACODYL 10 MG RE SUPP: 10.0000 mg | Freq: Every day | RECTAL | Status: DC | PRN

## 2022-12-07 MED ORDER — CEFAZOLIN SODIUM-DEXTROSE 2-4 GM/100ML-% IV SOLN
INTRAVENOUS | Status: AC
  Filled 2022-12-07: qty 100

## 2022-12-07 MED ORDER — ENOXAPARIN SODIUM 40 MG/0.4ML IJ SOSY
40.0000 mg | PREFILLED_SYRINGE | INTRAMUSCULAR | Status: DC
  Administered 2022-12-08 – 2022-12-13 (×6): 40 mg via SUBCUTANEOUS
  Filled 2022-12-07 (×6): qty 0.4

## 2022-12-07 MED ORDER — METOCLOPRAMIDE HCL 5 MG PO TABS: 5.0000 mg | ORAL_TABLET | Freq: Three times a day (TID) | ORAL | Status: DC | PRN

## 2022-12-07 MED ORDER — BUPIVACAINE LIPOSOME 1.3 % IJ SUSP
INTRAMUSCULAR | Status: AC
  Filled 2022-12-07: qty 20

## 2022-12-07 MED ORDER — FLEET ENEMA RE ENEM: 1.0000 | ENEMA | Freq: Once | RECTAL | Status: DC | PRN

## 2022-12-07 MED ORDER — ONDANSETRON HCL 4 MG/2ML IJ SOLN
4.0000 mg | INTRAMUSCULAR | Status: DC | PRN
  Administered 2022-12-07: 4 mg via INTRAVENOUS

## 2022-12-07 MED ORDER — CEFAZOLIN SODIUM-DEXTROSE 2-4 GM/100ML-% IV SOLN
2.0000 g | INTRAVENOUS | Status: AC
  Administered 2022-12-07: 2 g via INTRAVENOUS

## 2022-12-07 MED ORDER — METOCLOPRAMIDE HCL 5 MG/ML IJ SOLN: 5.0000 mg | Freq: Three times a day (TID) | INTRAMUSCULAR | Status: DC | PRN

## 2022-12-07 MED ORDER — MAGNESIUM HYDROXIDE 400 MG/5ML PO SUSP
30.0000 mL | Freq: Every day | ORAL | Status: DC | PRN
  Filled 2022-12-07: qty 30

## 2022-12-07 MED ORDER — FENTANYL CITRATE (PF) 100 MCG/2ML IJ SOLN
INTRAMUSCULAR | Status: DC | PRN
  Administered 2022-12-07: 100 ug via INTRAVENOUS

## 2022-12-07 MED ORDER — FENTANYL CITRATE (PF) 100 MCG/2ML IJ SOLN
INTRAMUSCULAR | Status: AC
  Filled 2022-12-07: qty 2

## 2022-12-07 MED ORDER — MORPHINE SULFATE (PF) 2 MG/ML IV SOLN
0.5000 mg | INTRAVENOUS | Status: DC | PRN
  Administered 2022-12-07: 0.5 mg via INTRAVENOUS
  Filled 2022-12-07: qty 1

## 2022-12-07 MED ORDER — 0.9 % SODIUM CHLORIDE (POUR BTL) OPTIME
TOPICAL | Status: DC | PRN
  Administered 2022-12-07: 500 mL

## 2022-12-07 MED ORDER — ACETAMINOPHEN 500 MG PO TABS
500.0000 mg | ORAL_TABLET | Freq: Four times a day (QID) | ORAL | Status: AC
  Administered 2022-12-08 (×3): 500 mg via ORAL
  Filled 2022-12-07 (×3): qty 1

## 2022-12-07 MED ORDER — POLYETHYLENE GLYCOL 3350 17 G PO PACK
17.0000 g | PACK | Freq: Every day | ORAL | Status: DC | PRN
  Administered 2022-12-09 – 2022-12-12 (×2): 17 g via ORAL
  Filled 2022-12-07 (×2): qty 1

## 2022-12-07 MED ORDER — TRIAMCINOLONE ACETONIDE 40 MG/ML IJ SUSP
INTRAMUSCULAR | Status: DC | PRN
  Administered 2022-12-07: 63 mL

## 2022-12-07 MED ORDER — CEFAZOLIN SODIUM-DEXTROSE 2-4 GM/100ML-% IV SOLN
2.0000 g | Freq: Three times a day (TID) | INTRAVENOUS | Status: AC
  Administered 2022-12-07 – 2022-12-08 (×2): 2 g via INTRAVENOUS
  Filled 2022-12-07 (×2): qty 100

## 2022-12-07 MED ORDER — MAGNESIUM HYDROXIDE 400 MG/5ML PO SUSP
30.0000 mL | Freq: Every day | ORAL | Status: DC | PRN
  Administered 2022-12-13: 30 mL via ORAL
  Filled 2022-12-07: qty 30

## 2022-12-07 MED ORDER — CHLORHEXIDINE GLUCONATE 0.12 % MT SOLN
15.0000 mL | Freq: Once | OROMUCOSAL | Status: AC
  Administered 2022-12-07: 15 mL via OROMUCOSAL

## 2022-12-07 MED ORDER — ONDANSETRON HCL 4 MG PO TABS: 4.0000 mg | ORAL_TABLET | Freq: Four times a day (QID) | ORAL | Status: DC | PRN

## 2022-12-07 MED ORDER — DOCUSATE SODIUM 100 MG PO CAPS: 100.0000 mg | ORAL_CAPSULE | Freq: Two times a day (BID) | ORAL | Status: DC

## 2022-12-07 MED ORDER — SUGAMMADEX SODIUM 200 MG/2ML IV SOLN
INTRAVENOUS | Status: DC | PRN
  Administered 2022-12-07: 200 mg via INTRAVENOUS

## 2022-12-07 MED ORDER — ALUM & MAG HYDROXIDE-SIMETH 200-200-20 MG/5ML PO SUSP: 30.0000 mL | ORAL | Status: DC | PRN

## 2022-12-07 MED ORDER — ONDANSETRON HCL 4 MG/2ML IJ SOLN: 4.0000 mg | Freq: Once | INTRAMUSCULAR | Status: DC | PRN

## 2022-12-07 MED ORDER — GLYCOPYRROLATE 0.2 MG/ML IJ SOLN
INTRAMUSCULAR | Status: DC | PRN
  Administered 2022-12-07: .2 mg via INTRAVENOUS

## 2022-12-07 MED ORDER — CHLORHEXIDINE GLUCONATE 4 % EX SOLN: 60.0000 mL | Freq: Once | CUTANEOUS | Status: DC

## 2022-12-07 MED ORDER — DIPHENHYDRAMINE HCL 12.5 MG/5ML PO ELIX: 12.5000 mg | ORAL_SOLUTION | ORAL | Status: DC | PRN

## 2022-12-07 MED ORDER — HYDROCODONE-ACETAMINOPHEN 5-325 MG PO TABS
1.0000 | ORAL_TABLET | Freq: Four times a day (QID) | ORAL | Status: DC | PRN
  Administered 2022-12-08 – 2022-12-12 (×5): 1 via ORAL
  Filled 2022-12-07 (×5): qty 1

## 2022-12-07 MED ORDER — PROPOFOL 10 MG/ML IV BOLUS
INTRAVENOUS | Status: AC
  Filled 2022-12-07: qty 20

## 2022-12-07 MED ORDER — SODIUM CHLORIDE 0.9 % IR SOLN
Status: DC | PRN
  Administered 2022-12-07: 3000 mL

## 2022-12-07 MED ORDER — ONDANSETRON HCL 4 MG/2ML IJ SOLN: 4.0000 mg | Freq: Four times a day (QID) | INTRAMUSCULAR | Status: DC | PRN

## 2022-12-07 SURGICAL SUPPLY — 60 items
APL PRP STRL LF DISP 70% ISPRP (MISCELLANEOUS) ×2
BAG DECANTER FOR FLEXI CONT (MISCELLANEOUS) IMPLANT
BIT DRILL JC 5IN 2.4M 127 24FL (BIT) IMPLANT
BLADE SAGITTAL WIDE XTHICK NO (BLADE) ×1 IMPLANT
BLADE SAW SAG 25.4X90 (BLADE) ×1 IMPLANT
BLADE SURG SZ20 CARB STEEL (BLADE) ×1 IMPLANT
BOWL CEMENT MIXING ADV NOZZLE (MISCELLANEOUS) IMPLANT
CHLORAPREP W/TINT 26 (MISCELLANEOUS) ×2 IMPLANT
DRAPE IMP U-DRAPE 54X76 (DRAPES) ×1 IMPLANT
DRAPE INCISE IOBAN 66X60 STRL (DRAPES) ×1 IMPLANT
DRAPE SURG 17X11 SM STRL (DRAPES) ×1 IMPLANT
DRAPE SURG 17X23 STRL (DRAPES) ×1 IMPLANT
DRSG OPSITE POSTOP 4X12 (GAUZE/BANDAGES/DRESSINGS) IMPLANT
DRSG OPSITE POSTOP 4X8 (GAUZE/BANDAGES/DRESSINGS) IMPLANT
ELECT BLADE 6.5 EXT (BLADE) IMPLANT
ELECT CAUTERY BLADE 6.4 (BLADE) ×1 IMPLANT
ELECT REM PT RETURN 9FT ADLT (ELECTROSURGICAL) ×1 IMPLANT
ELECTRODE REM PT RTRN 9FT ADLT (ELECTROSURGICAL) ×1 IMPLANT
GAUZE PACK 2X3YD (PACKING) IMPLANT
GAUZE XEROFORM 1X8 LF (GAUZE/BANDAGES/DRESSINGS) ×1 IMPLANT
GLOVE BIO SURGEON STRL SZ8 (GLOVE) ×3 IMPLANT
GLOVE BIOGEL M STRL SZ7.5 (GLOVE) IMPLANT
GLOVE BIOGEL PI IND STRL 8 (GLOVE) ×1 IMPLANT
GOWN STRL REUS W/ TWL LRG LVL3 (GOWN DISPOSABLE) ×1 IMPLANT
GOWN STRL REUS W/ TWL XL LVL3 (GOWN DISPOSABLE) ×1 IMPLANT
GOWN STRL REUS W/TWL LRG LVL3 (GOWN DISPOSABLE) ×2
GOWN STRL REUS W/TWL XL LVL3 (GOWN DISPOSABLE) ×2
HEAD MOD COCR 28MM HD -6MM NK (Orthopedic Implant) IMPLANT
HOOD PEEL AWAY T7 (MISCELLANEOUS) ×3 IMPLANT
IV NS 100ML SINGLE PACK (IV SOLUTION) IMPLANT
IV NS IRRIG 3000ML ARTHROMATIC (IV SOLUTION) ×2 IMPLANT
KIT PREP HIP W/CEMENT RESTRICT (Miscellaneous) IMPLANT
LABEL OR SOLS (LABEL) ×1 IMPLANT
MANIFOLD NEPTUNE II (INSTRUMENTS) ×1 IMPLANT
NDL FILTER BLUNT 18X1 1/2 (NEEDLE) ×1 IMPLANT
NDL SAFETY ECLIP 18X1.5 (MISCELLANEOUS) ×1 IMPLANT
NDL SPNL 20GX3.5 QUINCKE YW (NEEDLE) ×1 IMPLANT
NEEDLE FILTER BLUNT 18X1 1/2 (NEEDLE) ×1 IMPLANT
NEEDLE SPNL 20GX3.5 QUINCKE YW (NEEDLE) ×1 IMPLANT
NS IRRIG 500ML POUR BTL (IV SOLUTION) ×1 IMPLANT
PACK HIP PROSTHESIS (MISCELLANEOUS) ×1 IMPLANT
PULSAVAC PLUS IRRIG FAN TIP (DISPOSABLE) ×1 IMPLANT
RINGBLOC BI POLAR 28X45MM (Orthopedic Implant) ×1 IMPLANT
SHELL RINGBLOC BI POLR 28X45MM (Orthopedic Implant) IMPLANT
SPIKE FLUID TRANSFER (MISCELLANEOUS) ×2 IMPLANT
SPONGE T-LAP 18X18 ~~LOC~~+RFID (SPONGE) ×2 IMPLANT
STAPLER SKIN PROX 35W (STAPLE) ×1 IMPLANT
STEM FEMORAL 13MM X145MM ECHO (Stem) IMPLANT
STRAP SAFETY 5IN WIDE (MISCELLANEOUS) ×1 IMPLANT
SUT TICRON 2-0 30IN 311381 (SUTURE) ×4 IMPLANT
SUT VIC AB 0 CT1 36 (SUTURE) ×1 IMPLANT
SUT VIC AB 1 CT1 36 (SUTURE) ×1 IMPLANT
SUT VIC AB 2-0 CT1 (SUTURE) ×2 IMPLANT
SYR 10ML LL (SYRINGE) ×1 IMPLANT
SYR 30ML LL (SYRINGE) ×3 IMPLANT
SYR TB 1ML LL NO SAFETY (SYRINGE) IMPLANT
TIP BRUSH PULSAVAC PLUS 24.33 (MISCELLANEOUS) IMPLANT
TIP FAN IRRIG PULSAVAC PLUS (DISPOSABLE) ×1 IMPLANT
TRAP FLUID SMOKE EVACUATOR (MISCELLANEOUS) ×2 IMPLANT
WATER STERILE IRR 1000ML POUR (IV SOLUTION) ×1 IMPLANT

## 2022-12-07 NOTE — Transfer of Care (Signed)
Immediate Anesthesia Transfer of Care Note  [name]  Procedure(s) Performed: ARTHROPLASTY BIPOLAR HIP (HEMIARTHROPLASTY) (Right: Hip)  Patient Location: PACU  Anesthesia Type:General  Level of Consciousness: drowsy  Airway & Oxygen Therapy: Patient Spontanous Breathing and Patient connected to face mask oxygen  Post-op Assessment: Report given to RN and Post -op Vital signs reviewed and stable  Post vital signs: stable  Last Vitals:  Vitals Value Taken Time  BP    Temp    Pulse    Resp    SpO2      Last Pain:  Vitals:   12/07/22 1329  TempSrc: Temporal  PainSc:          Complications: No notable events documented.

## 2022-12-07 NOTE — Plan of Care (Signed)

## 2022-12-07 NOTE — Op Note (Signed)
12/07/2022  4:35 PM  Patient:   Kaylee Robinson  Pre-Op Diagnosis:   Displaced femoral neck fracture, right hip.  Post-Op Diagnosis:   Same.  Procedure:   Right hip bipolar hemiarthroplasty.  Surgeon:   Maryagnes Amos, MD  Assistant:   Griffin Basil, RNFA; Jacqulyn Liner, PA-S  Anesthesia:   Spinal  Findings:   As above.  Complications:   None  EBL:   150 cc  Fluids:   700 cc crystalloid  UOP:   None  TT:   None  Drains:   None  Closure:   Staples  Implants:   Biomet press-fit system with a #13 lateralized offset reduced proximal profile Echo femoral stem, a 45 mm outer diameter shell, and a 28 mm head with a -6 mm neck adapter.  Brief Clinical Note:   The patient is a 77 year old female who sustained above-noted injury last evening when she apparently fell from her wheelchair. She was brought to the emergency room where x-rays demonstrated the above-noted injury. The patient has been cleared medically and presents at this time for definitive management of the injury.  Procedure:   The patient was brought into the operating room. After adequate spinal anesthesia was obtained, the patient was repositioned in the left lateral decubitus position and secured using a lateral hip positioner. The right hip and lower extremity were prepped with ChloroPrep solution before being draped sterilely. Preoperative antibiotics were administered. A timeout was performed to verify the appropriate surgical site.    A standard posterior approach to the hip was made through an approximately 4-5 inch incision. The incision was carried down through the subcutaneous tissues to expose the gluteal fascia and proximal end of the iliotibial band. These structures were split the length of the incision and the Charnley self-retaining hip retractor placed. The bursal tissues were swept posteriorly to expose the short external rotators. The anterior border of the piriformis tendon was identified and this plane  developed down through the capsule to enter the joint. Abundant fracture hematoma was suctioned. A flap of tissue was elevated off the posterior aspect of the femoral neck and greater trochanter and retracted posteriorly. This flap included the piriformis tendon, the short external rotators, and the posterior capsule. The femoral head was removed in its entirety, then taken to the back table where it was measured and found to be optimally replicated by a 45 mm head. The appropriate trial head was inserted and found to demonstrate an excellent suction fit.   Attention was directed to the femoral side. The femoral neck was recut 10-12 mm above the lesser trochanter using an oscillating saw. The piriformis fossa was debrided of soft tissues before the intramedullary canal was accessed through this point using a triple step reamer. The canal was reamed sequentially beginning with a #7 tapered reamer and progressing to a #13 tapered reamer. This provided excellent circumferential chatter. A box osteotome was used to establish version before the canal was broached sequentially beginning with a #9 broach and progressing to a #13 broach. This was left in place and several trial reductions performed. The permanent #13 reduced proximal profile femoral stem was impacted into place. A repeat trial reduction was performed using the -6 mm neck length. The -6 mm neck length demonstrated excellent stability both in extension and external rotation as well as with flexion to 90 and internal rotation beyond 70. It also was stable in the position of sleep. The 45 mm outer diameter shell with the 28 mm  head and -6 mm neck adapter construct was put together on the back table before being impacted onto the stem of the femoral component. The Morse taper locking mechanism was verified using manual distraction before the head was relocated and the hip placed through a range of motion with the findings as described above.  The wound was  copiously irrigated with sterile saline solution via the jet lavage system before the peri-incisional and pericapsular tissues were injected with a "cocktail" of 20 cc of Exparel, 30 cc of 0.5% Sensorcaine, 2 cc of Kenalog 40 (80 mg), and 30 mg of Toradol diluted out to 60 cc with normal saline was injected to help with postoperative analgesia. The posterior flap was reapproximated to the posterior aspect of the greater trochanter using #2 Tycron interrupted sutures placed through drill holes. The iliotibial band was reapproximated using #1 Vicryl interrupted sutures before the gluteal fascia was closed using a running #1 Vicryl suture. The subcutaneous tissues were closed in several layers using 2-0 Vicryl interrupted sutures before the skin was closed using staples. A sterile occlusive dressing was applied to the wound . The patient then was rolled back into the supine position on the hospital bed before being awakened and returned to the recovery room in satisfactory condition after tolerating the procedure well.

## 2022-12-07 NOTE — ED Provider Notes (Signed)
[facility] Provider Note    Event Date/Time   First MD Initiated Contact with Patient 12/07/22 0020     (approximate)   History   No chief complaint on file.   HPI  Kaylee Robinson is a 77 y.o. female with history of dementia, hypertension who presents to the emergency department from [facility] after she had a fall out of her wheelchair.  It does not appear that patient walks at baseline.  She is unable to provide much history due to her dementia.  Tender over the right hip.  Skin tears noted to the right hand with bruising to the right wrist.  No head injury per staff.  The fall was witnessed.   History provided by patient, EMS.    Past Medical History:  Diagnosis Date   Alzheimer disease (HCC)    Arthritis    bilateral knees   Hypertension    Vascular dementia [facility])     Past Surgical History:  Procedure Laterality Date   HIP SURGERY     INTRAMEDULLARY (IM) NAIL INTERTROCHANTERIC Left 09/20/2018   Procedure: INTRAMEDULLARY (IM) NAIL INTERTROCHANTRIC;  Surgeon: Juanell Fairly, MD;  Location: [facility] ORS;  Service: Orthopedics;  Laterality: Left;   TONSILLECTOMY     adnoidectomy    MEDICATIONS:  Prior to Admission medications   Medication Sig Start Date End Date Taking? Authorizing Provider  albuterol (PROVENTIL HFA;VENTOLIN HFA) 108 (90 Base) MCG/ACT inhaler Inhale 2 puffs into the lungs every 6 (six) hours as needed for wheezing or shortness of breath. Patient not taking: Reported on 08/11/2022 09/11/15   Phineas Semen, MD  alum & mag hydroxide-simeth (MAALOX/MYLANTA) 200-200-20 MG/5ML suspension Take 30 mLs by mouth every 4 (four) hours as needed for indigestion. 09/23/18   Jimmye Norman, NP  bisacodyl (DULCOLAX) 10 MG suppository Place 1 suppository (10 mg total) rectally daily as needed for moderate constipation. Patient not taking: Reported on 08/11/2022 09/23/18   Jimmye Norman, NP  cetirizine (ZYRTEC) 10 MG tablet  Take 10 mg by mouth daily. 09/05/18   [provider]  docusate sodium (COLACE) 100 MG capsule Take 1 capsule (100 mg total) by mouth 2 (two) times daily. 09/23/18   Jimmye Norman, NP  donepezil (ARICEPT) 5 MG tablet Take 5 mg by mouth at bedtime. Patient not taking: Reported on 08/11/2022    [provider]  doxycycline (VIBRA-TABS) 100 MG tablet Take 1 tablet (100 mg total) by mouth 2 (two) times daily. Patient not taking: Reported on 08/11/2022 06/19/22   Minna Antis, MD  ferrous sulfate 325 (65 FE) MG tablet Take 1 tablet (325 mg total) by mouth 3 (three) times daily after meals. 09/23/18   Jimmye Norman, NP  fluticasone (FLONASE) 50 MCG/ACT nasal spray USE 2 SPRAY(S) IN EACH NOSTRIL ONCE DAILY Patient not taking: Reported on 08/11/2022 09/05/18   [provider]  HYDROcodone-acetaminophen (NORCO) 7.5-325 MG tablet Take 1 tablet by mouth every 4 (four) hours as needed for severe pain (pain score 7-10). Patient not taking: Reported on 08/11/2022 09/23/18   Jimmye Norman, NP  Melatonin 5 MG TABS Take 1 tablet (5 mg total) by mouth at bedtime as needed (sleep). 09/23/18   Jimmye Norman, NP  memantine (NAMENDA) 5 MG tablet Take 5 mg by mouth 2 (two) times daily. 08/07/18   [provider]  methocarbamol (ROBAXIN) 500 MG tablet Take 1 tablet (500 mg total) by mouth every 6 (six) hours as needed for muscle spasms.  Patient not taking: Reported on 08/11/2022 09/23/18   Jimmye Norman, NP  OLANZapine (ZYPREXA) 2.5 MG tablet Take 1 tablet (2.5 mg total) by mouth 2 (two) times daily as needed (agitation). Patient not taking: Reported on 08/11/2022 09/23/18   Jimmye Norman, NP  polyethylene glycol (MIRALAX / GLYCOLAX) 17 g packet Take 17 g by mouth daily as needed for mild constipation. 09/23/18   Jimmye Norman, NP  sertraline (ZOLOFT) 50 MG tablet Take 50 mg by mouth daily. Patient not taking: Reported on 08/11/2022  09/12/18   [provider]    Physical Exam   Triage Vital Signs: ED Triage Vitals  Encounter Vitals Group     BP 12/06/22 2240 (!) 181/76     Systolic BP Percentile --      Diastolic BP Percentile --      Pulse Rate 12/06/22 2240 72     Resp 12/06/22 2240 17     Temp 12/06/22 2240 97.7 F (36.5 C)     Temp src --      SpO2 12/06/22 2240 96 %     Weight 12/06/22 2238 106 lb (48.1 kg)     Height 12/06/22 2238 5\' 6"  (1.676 m)     Head Circumference --      Peak Flow --      Pain Score 12/06/22 2238 8     Pain Loc --      Pain Education --      Exclude from Growth Chart --     Most recent vital signs: Vitals:   12/07/22 0215 12/07/22 0308  BP: 123/63 (!) 168/88  Pulse: 67 81  Resp: 16 20  Temp:  97.8 F (36.6 C)  SpO2: 100% 95%    CONSTITUTIONAL: Alert, severely demented and does not answer questions appropriately, elderly, thin HEAD: Normocephalic, atraumatic EYES: Conjunctivae clear, pupils appear equal, sclera nonicteric ENT: normal nose; moist mucous membranes NECK: Supple, normal ROM CARD: RRR; S1 and S2 appreciated RESP: Normal chest excursion without splinting or tachypnea; breath sounds clear and equal bilaterally; no wheezes, no rhonchi, no rales, no hypoxia or respiratory distress, speaking full sentences ABD/GI: Non-distended; soft, non-tender, no rebound, no guarding, no peritoneal signs BACK: The back appears normal EXT: Tender to palpation over the right hip.  Ecchymosis and soft tissue swelling to the right wrist, skin tears to the right hand, 2+ DP and radial pulses bilaterally, compartments are soft.  Right leg is shortened and externally rotated.  Also has skin tears of the right elbow. SKIN: Normal color for age and race; warm; no rash on exposed skin NEURO: Moves all extremities equally, normal speech PSYCH: The patient's mood and manner are appropriate.   ED Results / Procedures / Treatments   LABS: (all labs ordered are listed, but  only abnormal results are displayed) Labs Reviewed  CBC WITH DIFFERENTIAL/PLATELET - Abnormal; Notable for the following components:      Result Value   WBC 13.8 (*)    Hemoglobin 11.7 (*)    Neutro Abs 11.6 (*)    Monocytes Absolute 1.1 (*)    Abs Immature Granulocytes 0.08 (*)    All other components within normal limits  BASIC METABOLIC PANEL - Abnormal; Notable for the following components:   Glucose, Bld 116 (*)    BUN 24 (*)    Calcium 8.1 (*)    All other components within normal limits  VALPROIC ACID LEVEL - Abnormal; Notable for the following components:   Valproic  Acid Lvl 29 (*)    All other components within normal limits  CBC - Abnormal; Notable for the following components:   WBC 12.4 (*)    Hemoglobin 11.7 (*)    All other components within normal limits  BASIC METABOLIC PANEL - Abnormal; Notable for the following components:   Glucose, Bld 122 (*)    Calcium 8.6 (*)    All other components within normal limits  PROTIME-INR  TYPE AND SCREEN  TYPE AND SCREEN  TYPE AND SCREEN     EKG:  EKG Interpretation Date/Time:  Wednesday December 06 2022 22:45:22 EDT Ventricular Rate:  72 PR Interval:  108 QRS Duration:  100 QT Interval:  432 QTC Calculation: 473 R Axis:   56  Text Interpretation: Sinus rhythm with short PR ST & T wave abnormality, consider inferior ischemia ST & T wave abnormality, consider anterolateral ischemia Prolonged QT Abnormal ECG No significant change since last tracing Confirmed by Rochele Raring [phone]) on 12/07/2022 12:27:19 AM         RADIOLOGY: My personal review and interpretation of imaging: X-ray shows a right femoral neck fracture.  I have personally reviewed all radiology reports.   DG Wrist Complete Right  Result Date: 12/07/2022 CLINICAL DATA:  Right wrist pain following fall, initial encounter EXAM: RIGHT WRIST - COMPLETE 3+ VIEW COMPARISON:  11/20/2022 FINDINGS: No acute fracture or dislocation is noted. Sclerosis is  noted in the radial metaphysis distally consistent with healing fracture. No soft tissue changes are noted. IMPRESSION: Healing distal fracture of the radius.  No acute abnormality noted. Electronically Signed   By: Alcide Clever M.D.   On: 12/07/2022 02:20   DG Elbow Complete Right  Result Date: 12/07/2022 CLINICAL DATA:  Recent fall with right elbow pain, initial encounter EXAM: RIGHT ELBOW - COMPLETE 3+ VIEW COMPARISON:  None Available. FINDINGS: There is no evidence of fracture, dislocation, or joint effusion. There is no evidence of arthropathy or other focal bone abnormality. Soft tissues are unremarkable. IMPRESSION: No acute abnormality noted. Electronically Signed   By: Alcide Clever M.D.   On: 12/07/2022 02:19   DG Chest 1 View  Result Date: 12/06/2022 CLINICAL DATA:  Fall, hip fracture. EXAM: CHEST  1 VIEW COMPARISON:  Chest radiograph 09/19/2018 FINDINGS: The heart is normal in size. Mediastinal contours are in unchanged. No focal airspace disease, large pleural effusion, or pneumothorax. Normal pulmonary vasculature. No evidence of displaced rib fracture or acute osseous finding. IMPRESSION: No acute findings. Electronically Signed   By: Narda Rutherford M.D.   On: 12/06/2022 23:28   DG HIP UNILAT WITH PELVIS 2-3 VIEWS RIGHT  Result Date: 12/06/2022 CLINICAL DATA:  Right hip pain, fall getting out of wheelchair. EXAM: DG HIP (WITH OR WITHOUT PELVIS) 2-3V RIGHT COMPARISON:  Radiograph 11/19/2020 FINDINGS: Displaced right femoral neck fracture. There is mild proximal migration of the femoral shaft. The femoral head is seated. The bones are diffusely under mineralized. The pubic rami are intact. Left proximal femur hardware is partially included. There is soft tissue edema lateral to the fracture site. IMPRESSION: Displaced right femoral neck fracture. Electronically Signed   By: Narda Rutherford M.D.   On: 12/06/2022 23:26     PROCEDURES:  Critical Care performed: No   CRITICAL  CARE Performed by: Baxter Hire Ambrie Carte   Total critical care time: 0 minutes  Critical care time was exclusive of separately billable procedures and treating other patients.  Critical care was necessary to treat or prevent imminent or life-threatening deterioration.  Critical care was time spent personally by me on the following activities: development of treatment plan with patient and/or surrogate as well as nursing, discussions with consultants, evaluation of patient's response to treatment, examination of patient, obtaining history from patient or surrogate, ordering and performing treatments and interventions, ordering and review of laboratory studies, ordering and review of radiographic studies, pulse oximetry and re-evaluation of patient's condition.   Marland Kitchen1-3 Lead EKG Interpretation  Performed by: Collins Kerby, Layla Maw, DO Authorized by: Mahdi Frye, Layla Maw, DO     Interpretation: normal     ECG rate:  81   ECG rate assessment: normal     Rhythm: sinus rhythm     Ectopy: none     Conduction: normal       IMPRESSION / MDM / ASSESSMENT AND PLAN / ED COURSE  I reviewed the triage vital signs and the nursing notes.    Patient here with fall out of her wheelchair.  Was witnessed by [employer].  Not on blood thinners.  The patient is on the cardiac monitor to evaluate for evidence of arrhythmia and/or significant heart rate changes.   DIFFERENTIAL DIAGNOSIS (includes but not limited to):   Hip fracture, contusion, skin tears, wrist fracture, elbow fracture   Patient's presentation is most consistent with acute presentation with potential threat to life or bodily function.   PLAN: Will obtain imaging of the right wrist, elbow, hip.  Will give pain medication.  Will obtain labs.   MEDICATIONS GIVEN IN ED: Medications  memantine (NAMENDA) tablet 5 mg (has no administration in time range)  alum & mag hydroxide-simeth (MAALOX/MYLANTA) 200-200-20 MG/5ML suspension 30 mL (has  no administration in time range)  docusate sodium (COLACE) capsule 100 mg (has no administration in time range)  polyethylene glycol (MIRALAX / GLYCOLAX) packet 17 g (has no administration in time range)  ferrous sulfate tablet 325 mg (has no administration in time range)  melatonin tablet 5 mg (has no administration in time range)  HYDROcodone-acetaminophen (NORCO/VICODIN) 5-325 MG per tablet 1-2 tablet (has no administration in time range)  morphine (PF) 2 MG/ML injection 0.5 mg (0.5 mg Intravenous Given 12/07/22 0333)  magnesium hydroxide (MILK OF MAGNESIA) suspension 30 mL (has no administration in time range)  acetaminophen (TYLENOL) tablet 650 mg (has no administration in time range)  ondansetron (ZOFRAN) injection 4 mg (has no administration in time range)  traZODone (DESYREL) tablet 25 mg (has no administration in time range)  fentaNYL (SUBLIMAZE) injection 50 mcg (50 mcg Intravenous Given 12/07/22 0148)  ondansetron (ZOFRAN) injection 4 mg (4 mg Intravenous Given 12/07/22 0148)  Tdap (BOOSTRIX) injection 0.5 mL (0.5 mLs Intramuscular Given 12/07/22 0148)     ED COURSE: X-rays reviewed and interpreted by myself and the radiologist and show a right displaced femoral neck fracture.  Will discuss with orthopedics and admit to the hospitalist.  I have attempted to get in touch with her social worker Crystal twice.  I have also left 2 messages on her son's voicemail and left my return number but have not heard back from him.   Labs show leukocytosis which may be reactive.  Normal creatinine.  Normal INR.  CONSULTS:  Secure chat to Dr. Steward Drone with orthopedics for consult.  He acknowledged the chat message.  Consulted and discussed patient's case with hospitalist, Dr. Arville Care.  I have recommended admission and consulting physician agrees and will place admission orders.  Patient (and family if present) agree with this plan.   I reviewed all nursing  notes, vitals, pertinent previous records.   All labs, EKGs, imaging ordered have been independently reviewed and interpreted by myself.    OUTSIDE RECORDS REVIEWED: Reviewed last admission in 2020 for left hip fracture.       FINAL CLINICAL IMPRESSION(S) / ED DIAGNOSES   Final diagnoses:  Closed displaced fracture of right femoral neck (HCC)  Multiple skin tears     Rx / DC Orders   ED Discharge Orders     None        Note:  This document was prepared using Dragon voice recognition software and may include unintentional dictation errors.   Quincie Haroon, Layla Maw, DO 12/07/22 [phone]

## 2022-12-07 NOTE — Assessment & Plan Note (Signed)
-   The patient be admitted to a medical-surgical bed. - Pain management will be provided. - Orthopedic consult will be obtained. - Dr. Blain Pais was notified about the patient.    Dr. Audelia Acton will be notified as well.

## 2022-12-07 NOTE — Consult Note (Signed)
ORTHOPAEDIC CONSULTATION  REQUESTING PHYSICIAN: Charise Killian, MD  Chief Complaint:   Right hip pain.  History of Present Illness: Kaylee Robinson is a 77 y.o. female with a history of hypertension and vascular dementia who lives in an assisted living facility.  Apparently she fell from her wheelchair last evening, injuring her right hip.  She was brought to the emergency room where x-rays demonstrated a displaced right femoral neck fracture.  The patient has been admitted for medical stabilization in preparation for definitive management of this injury.  The patient is quite demented and is unable to provide a good history.  Past Medical History:  Diagnosis Date   Alzheimer disease (HCC)    Arthritis    bilateral knees   Hypertension    Vascular dementia [facility])    Past Surgical History:  Procedure Laterality Date   HIP SURGERY     INTRAMEDULLARY (IM) NAIL INTERTROCHANTERIC Left 09/20/2018   Procedure: INTRAMEDULLARY (IM) NAIL INTERTROCHANTRIC;  Surgeon: Juanell Fairly, MD;  Location: [facility] ORS;  Service: Orthopedics;  Laterality: Left;   TONSILLECTOMY     adnoidectomy   Social History   Socioeconomic History   Marital status: Married    Spouse name: Not on file   Number of children: Not on file   Years of education: Not on file   Highest education level: Not on file  Occupational History   Not on file  Tobacco Use   Smoking status: Some Days    Current packs/day: 1.00    Types: Cigarettes   Smokeless tobacco: Never  Substance and Sexual Activity   Alcohol use: No   Drug use: Never   Sexual activity: Not Currently  Other Topics Concern   Not on file  Social History Narrative   Not on file   Social Determinants of Health   Financial Resource Strain: Low Risk  (06/12/2022)   Received from [facility], [facility]   Overall Financial Resource Strain (CARDIA)    Difficulty of Paying Living  Expenses: Not very hard  Food Insecurity: No Food Insecurity (06/12/2022)   Received from [facility], [facility]   Hunger Vital Sign    Worried About Running Out of Food in the Last Year: Never true    Ran Out of Food in the Last Year: Never true  Transportation Needs: No Transportation Needs (06/12/2022)   Received from [facility], [facility]   PRAPARE - Transportation    Lack of Transportation (Medical): No    Lack of Transportation (Non-Medical): No  Physical Activity: Insufficiently Active (06/12/2022)   Received from [facility], [facility]   Exercise Vital Sign    Days of Exercise per Week: 5 days    Minutes of Exercise per Session: 10 min  Stress: Patient Unable To Answer (06/12/2022)   Received from [facility], [facility] - Occupational Stress Questionnaire    Feeling of Stress : Patient unable to answer  Social Connections: Moderately Integrated (06/12/2022)   Received from [facility], [facility]   Social Network    How would you rate your social network (family, work, friends)?: Adequate participation with social networks   Family History  Problem Relation Age of Onset   Breast cancer Neg Hx    Allergies  Allergen Reactions   Enalapril Other (See Comments)   Naproxen Swelling   Amoxicillin Rash   Sulfa Antibiotics Rash   Prior to Admission medications   Medication Sig Start Date End Date  Taking? Authorizing Provider  albuterol (PROVENTIL HFA;VENTOLIN HFA) 108 (90 Base) MCG/ACT inhaler Inhale 2 puffs into the lungs every 6 (six) hours as needed for wheezing or shortness of breath. Patient not taking: Reported on 08/11/2022 09/11/15   Phineas Semen, MD  alum & mag hydroxide-simeth (MAALOX/MYLANTA) 200-200-20 MG/5ML suspension Take 30 mLs by mouth every 4 (four) hours as needed for indigestion. 09/23/18   Jimmye Norman, NP  bisacodyl (DULCOLAX) 10 MG suppository Place 1 suppository (10 mg  total) rectally daily as needed for moderate constipation. Patient not taking: Reported on 08/11/2022 09/23/18   Jimmye Norman, NP  cetirizine (ZYRTEC) 10 MG tablet Take 10 mg by mouth daily. 09/05/18   [provider]  docusate sodium (COLACE) 100 MG capsule Take 1 capsule (100 mg total) by mouth 2 (two) times daily. 09/23/18   Jimmye Norman, NP  donepezil (ARICEPT) 5 MG tablet Take 5 mg by mouth at bedtime. Patient not taking: Reported on 08/11/2022    [provider]  doxycycline (VIBRA-TABS) 100 MG tablet Take 1 tablet (100 mg total) by mouth 2 (two) times daily. Patient not taking: Reported on 08/11/2022 06/19/22   Minna Antis, MD  ferrous sulfate 325 (65 FE) MG tablet Take 1 tablet (325 mg total) by mouth 3 (three) times daily after meals. 09/23/18   Jimmye Norman, NP  fluticasone (FLONASE) 50 MCG/ACT nasal spray USE 2 SPRAY(S) IN EACH NOSTRIL ONCE DAILY Patient not taking: Reported on 08/11/2022 09/05/18   [provider]  HYDROcodone-acetaminophen (NORCO) 7.5-325 MG tablet Take 1 tablet by mouth every 4 (four) hours as needed for severe pain (pain score 7-10). Patient not taking: Reported on 08/11/2022 09/23/18   Jimmye Norman, NP  Melatonin 5 MG TABS Take 1 tablet (5 mg total) by mouth at bedtime as needed (sleep). 09/23/18   Jimmye Norman, NP  memantine (NAMENDA) 5 MG tablet Take 5 mg by mouth 2 (two) times daily. 08/07/18   [provider]  methocarbamol (ROBAXIN) 500 MG tablet Take 1 tablet (500 mg total) by mouth every 6 (six) hours as needed for muscle spasms. Patient not taking: Reported on 08/11/2022 09/23/18   Jimmye Norman, NP  OLANZapine (ZYPREXA) 2.5 MG tablet Take 1 tablet (2.5 mg total) by mouth 2 (two) times daily as needed (agitation). Patient not taking: Reported on 08/11/2022 09/23/18   Jimmye Norman, NP  polyethylene glycol (MIRALAX / GLYCOLAX) 17 g packet Take 17 g by mouth daily as  needed for mild constipation. 09/23/18   Jimmye Norman, NP  sertraline (ZOLOFT) 50 MG tablet Take 50 mg by mouth daily. Patient not taking: Reported on 08/11/2022 09/12/18   [provider]   DG Wrist Complete Right  Result Date: 12/07/2022 CLINICAL DATA:  Right wrist pain following fall, initial encounter EXAM: RIGHT WRIST - COMPLETE 3+ VIEW COMPARISON:  11/20/2022 FINDINGS: No acute fracture or dislocation is noted. Sclerosis is noted in the radial metaphysis distally consistent with healing fracture. No soft tissue changes are noted. IMPRESSION: Healing distal fracture of the radius.  No acute abnormality noted. Electronically Signed   By: Alcide Clever M.D.   On: 12/07/2022 02:20   DG Elbow Complete Right  Result Date: 12/07/2022 CLINICAL DATA:  Recent fall with right elbow pain, initial encounter EXAM: RIGHT ELBOW - COMPLETE 3+ VIEW COMPARISON:  None Available. FINDINGS: There is no evidence of fracture, dislocation, or joint effusion. There is no evidence of arthropathy or other focal bone abnormality.  Soft tissues are unremarkable. IMPRESSION: No acute abnormality noted. Electronically Signed   By: Alcide Clever M.D.   On: 12/07/2022 02:19   DG Chest 1 View  Result Date: 12/06/2022 CLINICAL DATA:  Fall, hip fracture. EXAM: CHEST  1 VIEW COMPARISON:  Chest radiograph 09/19/2018 FINDINGS: The heart is normal in size. Mediastinal contours are in unchanged. No focal airspace disease, large pleural effusion, or pneumothorax. Normal pulmonary vasculature. No evidence of displaced rib fracture or acute osseous finding. IMPRESSION: No acute findings. Electronically Signed   By: Narda Rutherford M.D.   On: 12/06/2022 23:28   DG HIP UNILAT WITH PELVIS 2-3 VIEWS RIGHT  Result Date: 12/06/2022 CLINICAL DATA:  Right hip pain, fall getting out of wheelchair. EXAM: DG HIP (WITH OR WITHOUT PELVIS) 2-3V RIGHT COMPARISON:  Radiograph 11/19/2020 FINDINGS: Displaced right femoral neck fracture.  There is mild proximal migration of the femoral shaft. The femoral head is seated. The bones are diffusely under mineralized. The pubic rami are intact. Left proximal femur hardware is partially included. There is soft tissue edema lateral to the fracture site. IMPRESSION: Displaced right femoral neck fracture. Electronically Signed   By: Narda Rutherford M.D.   On: 12/06/2022 23:26    Positive ROS: All other systems have been reviewed and were otherwise negative with the exception of those mentioned in the HPI and as above.  Physical Exam: General:  Alert, no acute distress Psychiatric:  Patient is not competent for consent, but exhibits normal mood and affect   Cardiovascular:  No pedal edema Respiratory:  No wheezing, non-labored breathing GI:  Abdomen is soft and non-tender Skin:  No lesions in the area of chief complaint Neurologic:  Sensation intact distally Lymphatic:  No axillary or cervical lymphadenopathy  Orthopedic Exam:  Orthopedic examination is limited to the right hip and lower extremity.  The right lower extremity is somewhat shortened and internally rotated as compared to the left.  Skin inspection around the right hip is unremarkable but no swelling, erythema, ecchymosis, abrasions, or other skin abnormalities are identified.  There is mild tenderness palpation over the lateral aspect of the hip.  She has more severe pain with any attempted active or passive motion of the hip.  She is grossly neurovascularly intact to the right lower extremity and foot, demonstrating the ability to dorsiflex and plantarflex her toes and ankle.  Sensations intact to light touch to all distributions.  She has good capillary refill to her toes.  X-rays:  X-rays of the pelvis and right hip are available for review and have been reviewed by myself.  The findings are as described above.  Assessment: Displaced right femoral neck fracture.  Plan: The treatment options, including both surgical and  nonsurgical choices, have been discussed in detail with the patient and the social worker responsible for her medical decision making.  The patient and social worker agree to proceed with a surgical procedure, specifically a right hip hemiarthroplasty.  The risks (including bleeding, infection, nerve and/or blood vessel injury, persistent or recurrent pain, loosening or failure of the components, leg length inequality, dislocation, need for further surgery, blood clots, strokes, heart attacks or arrhythmias, pneumonia, etc.) and benefits of the surgical procedure were discussed.  The social worker states her understanding and agrees to proceed on the patient's behalf.  A formal written consent will be obtained by the nursing staff.  Thank you for asking me to participate in the care of this most unfortunate woman.  I will be happy to  follow her with you.   Maryagnes Amos, MD  Beeper #:  [phone]  12/07/2022 2:36 PM

## 2022-12-07 NOTE — Anesthesia Postprocedure Evaluation (Signed)
Anesthesia Post Note  [name]  Procedure(s) Performed: ARTHROPLASTY BIPOLAR HIP (HEMIARTHROPLASTY) (Right: Hip)  Patient location during evaluation: PACU Anesthesia Type: General Level of consciousness: awake and alert Pain management: pain level controlled Vital Signs Assessment: post-procedure vital signs reviewed and stable Respiratory status: spontaneous breathing, nonlabored ventilation, respiratory function stable and patient connected to nasal cannula oxygen Cardiovascular status: blood pressure returned to baseline and stable Postop Assessment: no apparent nausea or vomiting Anesthetic complications: no   There were no known notable events for this encounter.   Last Vitals:  Vitals:   12/07/22 1715 12/07/22 1730  BP: (!) 116/57 (!) 105/57  Pulse: 66 65  Resp: 16 12  Temp:  36.4 C  SpO2: 100% 100%    Last Pain:  Vitals:   12/07/22 1730  TempSrc:   PainSc: 0-No pain                 Cleda Mccreedy Everley Evora

## 2022-12-07 NOTE — Assessment & Plan Note (Addendum)
-   We will continue BuSpar, trazodone, Depakote ER and Zoloft.

## 2022-12-07 NOTE — Significant Event (Addendum)
Spoke with the patient who unfortunately does have a history of dementia and is not able to converse.  She is currently at a facility.  I spoke with her facility who stated that surgical consent can be given by her social worker Lenn Sink at [phone].

## 2022-12-07 NOTE — Anesthesia Procedure Notes (Signed)
Procedure Name: Intubation Date/Time: 12/07/2022 2:55 PM  Performed by: Maryla Morrow., CRNAPre-anesthesia Checklist: Patient identified, Patient being monitored, Timeout performed, Emergency Drugs available and Suction available Patient Re-evaluated:Patient Re-evaluated prior to induction Oxygen Delivery Method: Circle system utilized Preoxygenation: Pre-oxygenation with 100% oxygen Induction Type: IV induction Ventilation: Mask ventilation without difficulty Laryngoscope Size: Miller and 2 Grade View: Grade I Tube type: Oral Tube size: 7.0 mm Number of attempts: 1 Airway Equipment and Method: Stylet Placement Confirmation: ETT inserted through vocal cords under direct vision, positive ETCO2 and breath sounds checked- equal and bilateral Secured at: 21 cm Tube secured with: Tape Dental Injury: Teeth and Oropharynx as per pre-operative assessment

## 2022-12-07 NOTE — Progress Notes (Signed)
PROGRESS NOTE    Kaylee Robinson  QMV:[id number] DOB: [birth year] DOA: 12/07/2022 PCP: [facility]  Assessment & Plan:   Principal Problem:   Closed right hip fracture (HCC) Active Problems:   Dementia with behavioral disturbance (HCC)   Anxiety and depression  Assessment and Plan: Closed right hip fracture: norco, morphine prn for pain. Will likely go for surg today. Ortho surg following and recs apprec    Dementia: continue on home dose of aricept, namenda, haldol, zyprexa    Depression: severity unknown. Continue on home dose of zoloft, buspar, trazodone,d depakote        DVT prophylaxis: SCDs Code Status: full  Family Communication:  Disposition Plan: depends on PT/OT recs (not consulted yet)   Level of care: Med-Surg Status is: Inpatient Remains inpatient appropriate because: severity of illness    Consultants:  Ortho surg   Procedures:   Antimicrobials:  Subjective: Pt c/o leg pain   Objective: Vitals:   12/07/22 0200 12/07/22 0215 12/07/22 0308 12/07/22 0812  BP: 113/60 123/63 (!) 168/88 123/63  Pulse: 78 67 81 66  Resp: 19 16 20 17   Temp:   97.8 F (36.6 C) 98.3 F (36.8 C)  SpO2: (!) 76% 100% 95% 95%  Weight:      Height:       No intake or output data in the 24 hours ending 12/07/22 0828 Filed Weights   12/06/22 2238  Weight: 48.1 kg    Examination:  General exam: Appears calm but uncomfortable  Respiratory system: Clear to auscultation. Respiratory effort normal. Cardiovascular system: S1 & S2+. No rubs, gallops or clicks.  Gastrointestinal system: Abdomen is nondistended, soft and nontender.  Normal bowel sounds heard. Central nervous system: Alert and awake Psychiatry: Judgement and insight appears poor. Flat mood and affect    Data Reviewed: I have personally reviewed following labs and imaging studies  CBC: Recent Labs  Lab 12/07/22 0030 12/07/22 0310  WBC 13.8* 12.4*  NEUTROABS 11.6*  --   HGB 11.7* 11.7*   HCT 36.7 36.4  MCV 92.2 91.2  PLT 258 265   Basic Metabolic Panel: Recent Labs  Lab 12/07/22 0030 12/07/22 0310  NA 137 137  K 4.3 4.2  CL 103 103  CO2 25 24  GLUCOSE 116* 122*  BUN 24* 23  CREATININE 0.67 0.65  CALCIUM 8.1* 8.6*   GFR: Estimated Creatinine Clearance: 45.4 mL/min (by C-G formula based on SCr of 0.65 mg/dL). Liver Function Tests: No results for input(s): "AST", "ALT", "ALKPHOS", "BILITOT", "PROT", "ALBUMIN" in the last 168 hours. No results for input(s): "LIPASE", "AMYLASE" in the last 168 hours. No results for input(s): "AMMONIA" in the last 168 hours. Coagulation Profile: Recent Labs  Lab 12/07/22 0030  INR 1.1   Cardiac Enzymes: No results for input(s): "CKTOTAL", "CKMB", "CKMBINDEX", "TROPONINI" in the last 168 hours. BNP (last 3 results) No results for input(s): "PROBNP" in the last 8760 hours. HbA1C: No results for input(s): "HGBA1C" in the last 72 hours. CBG: No results for input(s): "GLUCAP" in the last 168 hours. Lipid Profile: No results for input(s): "CHOL", "HDL", "LDLCALC", "TRIG", "CHOLHDL", "LDLDIRECT" in the last 72 hours. Thyroid Function Tests: No results for input(s): "TSH", "T4TOTAL", "FREET4", "T3FREE", "THYROIDAB" in the last 72 hours. Anemia Panel: No results for input(s): "VITAMINB12", "FOLATE", "FERRITIN", "TIBC", "IRON", "RETICCTPCT" in the last 72 hours. Sepsis Labs: No results for input(s): "PROCALCITON", "LATICACIDVEN" in the last 168 hours.  No results found for this or any previous visit (from  the past 240 hour(s)).       Radiology Studies: DG Wrist Complete Right  Result Date: 12/07/2022 CLINICAL DATA:  Right wrist pain following fall, initial encounter EXAM: RIGHT WRIST - COMPLETE 3+ VIEW COMPARISON:  11/20/2022 FINDINGS: No acute fracture or dislocation is noted. Sclerosis is noted in the radial metaphysis distally consistent with healing fracture. No soft tissue changes are noted. IMPRESSION: Healing distal  fracture of the radius.  No acute abnormality noted. Electronically Signed   By: Alcide Clever M.D.   On: 12/07/2022 02:20   DG Elbow Complete Right  Result Date: 12/07/2022 CLINICAL DATA:  Recent fall with right elbow pain, initial encounter EXAM: RIGHT ELBOW - COMPLETE 3+ VIEW COMPARISON:  None Available. FINDINGS: There is no evidence of fracture, dislocation, or joint effusion. There is no evidence of arthropathy or other focal bone abnormality. Soft tissues are unremarkable. IMPRESSION: No acute abnormality noted. Electronically Signed   By: Alcide Clever M.D.   On: 12/07/2022 02:19   DG Chest 1 View  Result Date: 12/06/2022 CLINICAL DATA:  Fall, hip fracture. EXAM: CHEST  1 VIEW COMPARISON:  Chest radiograph 09/19/2018 FINDINGS: The heart is normal in size. Mediastinal contours are in unchanged. No focal airspace disease, large pleural effusion, or pneumothorax. Normal pulmonary vasculature. No evidence of displaced rib fracture or acute osseous finding. IMPRESSION: No acute findings. Electronically Signed   By: Narda Rutherford M.D.   On: 12/06/2022 23:28   DG HIP UNILAT WITH PELVIS 2-3 VIEWS RIGHT  Result Date: 12/06/2022 CLINICAL DATA:  Right hip pain, fall getting out of wheelchair. EXAM: DG HIP (WITH OR WITHOUT PELVIS) 2-3V RIGHT COMPARISON:  Radiograph 11/19/2020 FINDINGS: Displaced right femoral neck fracture. There is mild proximal migration of the femoral shaft. The femoral head is seated. The bones are diffusely under mineralized. The pubic rami are intact. Left proximal femur hardware is partially included. There is soft tissue edema lateral to the fracture site. IMPRESSION: Displaced right femoral neck fracture. Electronically Signed   By: Narda Rutherford M.D.   On: 12/06/2022 23:26        Scheduled Meds:  docusate sodium  100 mg Oral BID   ferrous sulfate  325 mg Oral TID WC   memantine  5 mg Oral BID   Continuous Infusions:   LOS: 0 days     Charise Killian,  MD [facility] Pager [phone]  If 7PM-7AM, please contact night-coverage [email or web address] 12/07/2022, 8:28 AM

## 2022-12-07 NOTE — Assessment & Plan Note (Addendum)
-   We will continue her Aricept, Namenda and Haldol as well as Zyprexa.

## 2022-12-07 NOTE — Progress Notes (Signed)
Initial Nutrition Assessment  DOCUMENTATION CODES:   Underweight, Severe malnutrition in context of chronic illness  INTERVENTION:   -Once diet is advanced, add:   -Ensure Enlive po BID, each supplement provides 350 kcal and 20 grams of protein.  -MVI with minerals daily -Magic cup TID with meals, each supplement provides 290 kcal and 9 grams of protein  -Feeding assistance with meals  NUTRITION DIAGNOSIS:   Severe Malnutrition related to chronic illness (dementia) as evidenced by moderate fat depletion, severe fat depletion, moderate muscle depletion, severe muscle depletion.  GOAL:   Patient will meet greater than or equal to 90% of their needs  MONITOR:   PO intake, Supplement acceptance, Diet advancement  REASON FOR ASSESSMENT:   Consult Hip fracture protocol  ASSESSMENT:   Pt with medical history significant for dementia and hypertension, who presented with acute onset of fall from her wheelchair with subsequent right hip pain.  Pt admitted with closed rt hip fracture.    Per orthopedics notes, pt NPO for surgery today.  Pt lying in bed at time of visit. She aroused easily to voice, but unable to provide significant history secondary to dementia.   Reviewed wt hx; noted wt stability over the past 5 months, however, noted history of distant weight loss.   Pt with increased nutritional needs for post-op healing and would benefit form addition of oral nutrition supplements.   Medications reviewed and include ferrous sulfate and colace.   Labs reviewed.    NUTRITION - FOCUSED PHYSICAL EXAM:  Flowsheet Row Most Recent Value  Orbital Region Severe depletion  Upper Arm Region Severe depletion  Thoracic and Lumbar Region Moderate depletion  Buccal Region Moderate depletion  Temple Region Severe depletion  Clavicle Bone Region Severe depletion  Clavicle and Acromion Bone Region Severe depletion  Scapular Bone Region Severe depletion  Dorsal Hand Moderate  depletion  Patellar Region Severe depletion  Anterior Thigh Region Severe depletion  Posterior Calf Region Severe depletion  Edema (RD Assessment) None  Hair Reviewed  Eyes Reviewed  Mouth Reviewed  Skin Reviewed  Nails Reviewed       Diet Order:   Diet Order             Diet NPO time specified Except for: Sips with Meds  Diet effective midnight           Diet NPO time specified  Diet effective now                   EDUCATION NEEDS:   Not appropriate for education at this time  Skin:  Skin Assessment: Reviewed RN Assessment  Last BM:  Unknown  Height:   Ht Readings from Last 1 Encounters:  12/06/22 5\' 6"  (1.676 m)    Weight:   Wt Readings from Last 1 Encounters:  12/06/22 48.1 kg    Ideal Body Weight:  59.1 kg  BMI:  Body mass index is 17.11 kg/m.  Estimated Nutritional Needs:   Kcal:  1700-1900  Protein:  90-105 grams  Fluid:  > 1.7 L    Levada Schilling, RD, LDN, CDCES Registered Dietitian II Certified Diabetes Care and Education Specialist Please refer to [facility] for RD and/or RD on-call/weekend/after hours pager

## 2022-12-07 NOTE — H&P (Addendum)
[place]   PATIENT NAME: Kaylee Robinson    MR#:  [id number]  DATE OF BIRTH:  April 22, 1945  DATE OF ADMISSION:  12/07/2022  PRIMARY CARE PHYSICIAN: [facility]   Patient is coming from: [facility]  REQUESTING/REFERRING PHYSICIAN: Ward, Layla Maw, DO  CHIEF COMPLAINT:  Fall  HISTORY OF PRESENT ILLNESS:  Kaylee Robinson is a 77 y.o. Caucasian female with medical history significant for dementia and hypertension, who presented to the emergency room with acute onset of fall from her wheelchair with subsequent right hip pain.  No reported presyncope or syncope.  No reported chest pain or palpitations.  No reported paresthesias or focal muscle weakness.  No reported fever or chills.  No reported nausea or vomiting or abdominal pain.  No reported dysuria, oliguria or hematuria or flank pain.  ED Course: When the patient came to the ER, BP was 181/76 with otherwise normal vital signs.  Labs revealed a BUN of 24 with a calcium of 8.1 and CBC showing leukocytosis of 13.8.  Valproic acid level was 29 EKG as reviewed by me : EKG showed normal sinus rhythm with a rate of 72 short PR, prolonged QT interval with QTc of 473 MS with T wave inversion anterolaterally. Imaging: Portable chest x-ray showed no acute cardiopulmonary disease.  Right elbow x-ray showed no acute abnormality.  Right wrist x-ray showed healing distal fracture of the radius with no acute abnormality.  The patient was given 50 mcg of IV fentanyl, 4 mg of IV Zofran and Tdap booster.  She will be admitted to a medical bed for further evaluation and management. PAST MEDICAL HISTORY:   Past Medical History:  Diagnosis Date   Alzheimer disease (HCC)    Arthritis    bilateral knees   Hypertension    Vascular dementia (HCC)     PAST SURGICAL HISTORY:   Past Surgical History:  Procedure Laterality Date   HIP SURGERY     INTRAMEDULLARY (IM) NAIL INTERTROCHANTERIC Left 09/20/2018   Procedure:  INTRAMEDULLARY (IM) NAIL INTERTROCHANTRIC;  Surgeon: Juanell Fairly, MD;  Location: [facility] ORS;  Service: Orthopedics;  Laterality: Left;   TONSILLECTOMY     adnoidectomy    SOCIAL HISTORY:   Social History   Tobacco Use   Smoking status: Some Days    Current packs/day: 1.00    Types: Cigarettes   Smokeless tobacco: Never  Substance Use Topics   Alcohol use: No    FAMILY HISTORY:   Family History  Problem Relation Age of Onset   Breast cancer Neg Hx     DRUG ALLERGIES:   Allergies  Allergen Reactions   Enalapril Other (See Comments)   Naproxen Swelling   Amoxicillin Rash   Sulfa Antibiotics Rash    REVIEW OF SYSTEMS:   ROS As per history of present illness. All pertinent systems were reviewed above. Constitutional, HEENT, cardiovascular, respiratory, GI, GU, musculoskeletal, neuro, psychiatric, endocrine, integumentary and hematologic systems were reviewed and are otherwise negative/unremarkable except for positive findings mentioned above in the HPI.   MEDICATIONS AT HOME:   Prior to Admission medications   Medication Sig Start Date End Date Taking? Authorizing Provider  albuterol (PROVENTIL HFA;VENTOLIN HFA) 108 (90 Base) MCG/ACT inhaler Inhale 2 puffs into the lungs every 6 (six) hours as needed for wheezing or shortness of breath. Patient not taking: Reported on 08/11/2022 09/11/15   Phineas Semen, MD  alum & mag hydroxide-simeth (MAALOX/MYLANTA) 200-200-20 MG/5ML suspension Take 30 mLs by mouth every  4 (four) hours as needed for indigestion. 09/23/18   Jimmye Norman, NP  bisacodyl (DULCOLAX) 10 MG suppository Place 1 suppository (10 mg total) rectally daily as needed for moderate constipation. Patient not taking: Reported on 08/11/2022 09/23/18   Jimmye Norman, NP  cetirizine (ZYRTEC) 10 MG tablet Take 10 mg by mouth daily. 09/05/18   [provider]  docusate sodium (COLACE) 100 MG capsule Take 1 capsule (100 mg total) by mouth 2 (two)  times daily. 09/23/18   Jimmye Norman, NP  donepezil (ARICEPT) 5 MG tablet Take 5 mg by mouth at bedtime. Patient not taking: Reported on 08/11/2022    [provider]  doxycycline (VIBRA-TABS) 100 MG tablet Take 1 tablet (100 mg total) by mouth 2 (two) times daily. Patient not taking: Reported on 08/11/2022 06/19/22   Minna Antis, MD  ferrous sulfate 325 (65 FE) MG tablet Take 1 tablet (325 mg total) by mouth 3 (three) times daily after meals. 09/23/18   Jimmye Norman, NP  fluticasone (FLONASE) 50 MCG/ACT nasal spray USE 2 SPRAY(S) IN EACH NOSTRIL ONCE DAILY Patient not taking: Reported on 08/11/2022 09/05/18   [provider]  HYDROcodone-acetaminophen (NORCO) 7.5-325 MG tablet Take 1 tablet by mouth every 4 (four) hours as needed for severe pain (pain score 7-10). Patient not taking: Reported on 08/11/2022 09/23/18   Jimmye Norman, NP  Melatonin 5 MG TABS Take 1 tablet (5 mg total) by mouth at bedtime as needed (sleep). 09/23/18   Jimmye Norman, NP  memantine (NAMENDA) 5 MG tablet Take 5 mg by mouth 2 (two) times daily. 08/07/18   [provider]  methocarbamol (ROBAXIN) 500 MG tablet Take 1 tablet (500 mg total) by mouth every 6 (six) hours as needed for muscle spasms. Patient not taking: Reported on 08/11/2022 09/23/18   Jimmye Norman, NP  OLANZapine (ZYPREXA) 2.5 MG tablet Take 1 tablet (2.5 mg total) by mouth 2 (two) times daily as needed (agitation). Patient not taking: Reported on 08/11/2022 09/23/18   Jimmye Norman, NP  polyethylene glycol (MIRALAX / GLYCOLAX) 17 g packet Take 17 g by mouth daily as needed for mild constipation. 09/23/18   Jimmye Norman, NP  sertraline (ZOLOFT) 50 MG tablet Take 50 mg by mouth daily. Patient not taking: Reported on 08/11/2022 09/12/18   [provider]      VITAL SIGNS:  Blood pressure (!) 168/88, pulse 81, temperature 97.8 F (36.6 C), resp. rate 20, height 5\' 6"   (1.676 m), weight 48.1 kg, SpO2 95%.  PHYSICAL EXAMINATION:  Physical Exam  GENERAL:  77 y.o.-year-old Caucasian female patient lying in the bed with no acute distress.  EYES: Pupils equal, round, reactive to light and accommodation. No scleral icterus. Extraocular muscles intact.  HEENT: Head atraumatic, normocephalic. Oropharynx and nasopharynx clear.  NECK:  Supple, no jugular venous distention. No thyroid enlargement, no tenderness.  LUNGS: Normal breath sounds bilaterally, no wheezing, rales,rhonchi or crepitation. No use of accessory muscles of respiration.  CARDIOVASCULAR: Regular rate and rhythm, S1, S2 normal. No murmurs, rubs, or gallops.  ABDOMEN: Soft, nondistended, nontender. Bowel sounds present. No organomegaly or mass.  EXTREMITIES: No pedal edema, cyanosis, or clubbing.  NEUROLOGIC: Cranial nerves II through XII are intact. Muscle strength 5/5 in all extremities. Sensation intact. Gait not checked. Musculoskeletal: Right lateral hip tenderness. PSYCHIATRIC: The patient is alert and oriented x 3.  Normal affect and good eye contact. SKIN: No obvious rash, lesion, or ulcer.  LABORATORY PANEL:   CBC Recent Labs  Lab 12/07/22 0310  WBC 12.4*  HGB 11.7*  HCT 36.4  PLT 265   ------------------------------------------------------------------------------------------------------------------  Chemistries  Recent Labs  Lab 12/07/22 0310  NA 137  K 4.2  CL 103  CO2 24  GLUCOSE 122*  BUN 23  CREATININE 0.65  CALCIUM 8.6*   ------------------------------------------------------------------------------------------------------------------  Cardiac Enzymes No results for input(s): "TROPONINI" in the last 168 hours. ------------------------------------------------------------------------------------------------------------------  RADIOLOGY:  DG Wrist Complete Right  Result Date: 12/07/2022 CLINICAL DATA:  Right wrist pain following fall, initial encounter EXAM:  RIGHT WRIST - COMPLETE 3+ VIEW COMPARISON:  11/20/2022 FINDINGS: No acute fracture or dislocation is noted. Sclerosis is noted in the radial metaphysis distally consistent with healing fracture. No soft tissue changes are noted. IMPRESSION: Healing distal fracture of the radius.  No acute abnormality noted. Electronically Signed   By: Alcide Clever M.D.   On: 12/07/2022 02:20   DG Elbow Complete Right  Result Date: 12/07/2022 CLINICAL DATA:  Recent fall with right elbow pain, initial encounter EXAM: RIGHT ELBOW - COMPLETE 3+ VIEW COMPARISON:  None Available. FINDINGS: There is no evidence of fracture, dislocation, or joint effusion. There is no evidence of arthropathy or other focal bone abnormality. Soft tissues are unremarkable. IMPRESSION: No acute abnormality noted. Electronically Signed   By: Alcide Clever M.D.   On: 12/07/2022 02:19   DG Chest 1 View  Result Date: 12/06/2022 CLINICAL DATA:  Fall, hip fracture. EXAM: CHEST  1 VIEW COMPARISON:  Chest radiograph 09/19/2018 FINDINGS: The heart is normal in size. Mediastinal contours are in unchanged. No focal airspace disease, large pleural effusion, or pneumothorax. Normal pulmonary vasculature. No evidence of displaced rib fracture or acute osseous finding. IMPRESSION: No acute findings. Electronically Signed   By: Narda Rutherford M.D.   On: 12/06/2022 23:28   DG HIP UNILAT WITH PELVIS 2-3 VIEWS RIGHT  Result Date: 12/06/2022 CLINICAL DATA:  Right hip pain, fall getting out of wheelchair. EXAM: DG HIP (WITH OR WITHOUT PELVIS) 2-3V RIGHT COMPARISON:  Radiograph 11/19/2020 FINDINGS: Displaced right femoral neck fracture. There is mild proximal migration of the femoral shaft. The femoral head is seated. The bones are diffusely under mineralized. The pubic rami are intact. Left proximal femur hardware is partially included. There is soft tissue edema lateral to the fracture site. IMPRESSION: Displaced right femoral neck fracture. Electronically Signed    By: Narda Rutherford M.D.   On: 12/06/2022 23:26      IMPRESSION AND PLAN:  Assessment and Plan: * Closed right hip fracture [facility]) - The patient be admitted to a medical-surgical bed. - Pain management will be provided. - Orthopedic consult will be obtained. - Dr. Blain Pais was notified about the patient.    Dr. Audelia Acton will be notified as well.  Dementia with behavioral disturbance (HCC) - We will continue her Aricept, Namenda and Haldol as well as Zyprexa.  Anxiety and depression - We will continue BuSpar, trazodone, Depakote ER and Zoloft.   DVT prophylaxis: SCDs. Advanced Care Planning:  Code Status: full code.  Family Communication:  The plan of care was discussed in details with the patient (and family). I answered all questions. The patient agreed to proceed with the above mentioned plan. Further management will depend upon hospital course. Disposition Plan: Back to previous home environment Consults called: Orthopedic consult All the records are reviewed and case discussed with ED provider.  Status is: Inpatient  At the time of the admission, it appears that the  appropriate admission status for this patient is inpatient.  This is judged to be reasonable and necessary in order to provide the required intensity of service to ensure the patient's safety given the presenting symptoms, physical exam findings and initial radiographic and laboratory data in the context of comorbid conditions.  The patient requires inpatient status due to high intensity of service, high risk of further deterioration and high frequency of surveillance required.  I certify that at the time of admission, it is my clinical judgment that the patient will require inpatient hospital care extending more than 2 midnights.                            Dispo: The patient is from: Home              Anticipated d/c is to: Home              Patient currently is not medically stable to d/c.              Difficult  to place patient: No  Hannah Beat M.D on 12/07/2022 at 3:45 AM  [facility]   From 7 PM-7 AM, contact night-coverage [email or web address]  CC: Primary care physician; [facility]

## 2022-12-07 NOTE — Anesthesia Preprocedure Evaluation (Addendum)
Anesthesia Evaluation  Patient identified by MRN, date of birth, ID band Patient awake    Reviewed: Allergy & Precautions, H&P , NPO status , Patient's Chart, lab work & pertinent test results  History of Anesthesia Complications Negative for: history of anesthetic complications  Airway Mallampati: II  TM Distance: >3 FB Neck ROM: full    Dental  (+) Chipped, Dental Advidsory Given   Pulmonary neg shortness of breath, neg recent URI, Current Smoker and Patient abstained from smoking.   Pulmonary exam normal        Cardiovascular hypertension, (-) angina (-) Past MI and (-) Cardiac Stents Normal cardiovascular exam     Neuro/Psych  PSYCHIATRIC DISORDERS Anxiety Depression   Dementia negative neurological ROS     GI/Hepatic negative GI ROS, Neg liver ROS,,,  Endo/Other  negative endocrine ROS    Renal/GU negative Renal ROS     Musculoskeletal  (+) Arthritis ,    Abdominal   Peds  Hematology  (+) Blood dyscrasia, anemia   Anesthesia Other Findings Past Medical History: No date: Alzheimer disease (HCC) No date: Arthritis     Comment:  bilateral knees No date: Hypertension No date: Vascular dementia [facility])  Past Surgical History: No date: HIP SURGERY 09/20/2018: INTRAMEDULLARY (IM) NAIL INTERTROCHANTERIC; Left     Comment:  Procedure: INTRAMEDULLARY (IM) NAIL INTERTROCHANTRIC;                Surgeon: Juanell Fairly, MD;  Location: [facility] ORS;                Service: Orthopedics;  Laterality: Left; No date: TONSILLECTOMY     Comment:  adnoidectomy  BMI    Body Mass Index: 17.11 kg/m      Reproductive/Obstetrics negative OB ROS                             Anesthesia Physical Anesthesia Plan  ASA: 2  Anesthesia Plan: General   Post-op Pain Management:    Induction: Intravenous  PONV Risk Score and Plan: Ondansetron, Dexamethasone and Treatment may vary due to age or medical  condition  Airway Management Planned: Oral ETT  Additional Equipment:   Intra-op Plan:   Post-operative Plan: Extubation in OR  Informed Consent:      Dental Advisory Given  Plan Discussed with: CRNA and Surgeon  Anesthesia Plan Comments:         Anesthesia Quick Evaluation

## 2022-12-08 ENCOUNTER — Encounter: Payer: Self-pay | Admitting: Surgery

## 2022-12-08 DIAGNOSIS — S72001A Fracture of unspecified part of neck of right femur, initial encounter for closed fracture: Secondary | ICD-10-CM | POA: Diagnosis not present

## 2022-12-08 LAB — BASIC METABOLIC PANEL
Anion gap: 8 (ref 5–15)
BUN: 26 mg/dL — ABNORMAL HIGH (ref 8–23)
CO2: 25 mmol/L (ref 22–32)
Calcium: 8.1 mg/dL — ABNORMAL LOW (ref 8.9–10.3)
Chloride: 102 mmol/L (ref 98–111)
Creatinine, Ser: 0.72 mg/dL (ref 0.44–1.00)
GFR, Estimated: >60 mL/min (ref >60)
Glucose, Bld: 123 mg/dL — ABNORMAL HIGH (ref 70–99)
Potassium: 4.9 mmol/L (ref 3.5–5.1)
Sodium: 135 mmol/L (ref 135–145)

## 2022-12-08 LAB — CBC
HCT: 29.5 % — ABNORMAL LOW (ref 36.0–46.0)
Hemoglobin: 9.7 g/dL — ABNORMAL LOW (ref 12.0–15.0)
MCH: 29.8 pg (ref 26.0–34.0)
MCHC: 32.9 g/dL (ref 30.0–36.0)
MCV: 90.5 fL (ref 80.0–100.0)
Platelets: 226 10*3/uL (ref 150–400)
RBC: 3.26 MIL/uL — ABNORMAL LOW (ref 3.87–5.11)
RDW: 12 % (ref 11.5–15.5)
WBC: 6.9 10*3/uL (ref 4.0–10.5)
nRBC: 0 % (ref 0.0–0.2)

## 2022-12-08 NOTE — Evaluation (Signed)
Physical Therapy Evaluation Patient Details Name: Kaylee Robinson MRN: [id number] DOB: [birth year] Today's Date: 12/08/2022  History of Present Illness  Patient is a 77 y.o. Caucasian female with medical history significant for dementia (Alzheimer's) and hypertension, who presented to the emergency room with acute onset of fall from her wheelchair with subsequent right hip pain. She is now s/p right hip bipolar hemiarthroplasty.  Clinical Impression  Pt found laying in bed upon entry to the room. Pt overall calm with flat affect. She is not oriented to self, time, place, or reason for hospital admittance. Is unaware she had surgery. She is Min A for rolling in bed, but is needing +2 to sit up EOB and to maintain post hip precautions. Overall cognitive status preventing pt's ability to understand precautions. Once seated EOB, pt unable to stand safely without reliance on PT for assist, she was unable to keep LLE on ground when attempting to stand as well, unsafe to attempt further mobility during session. Pt will benefit from continued PT services upon discharge to safely address deficits listed in patient problem list for decreased caregiver assistance and eventual return to PLOF.       If plan is discharge home, recommend the following: A lot of help with bathing/dressing/bathroom;Assistance with feeding;Direct supervision/assist for medications management;Direct supervision/assist for financial management;Assist for transportation;Help with stairs or ramp for entrance;Assistance with cooking/housework;Two people to help with walking and/or transfers   Can travel by private vehicle   No    Equipment Recommendations Other (comment) (TBD at next venue of care)  Recommendations for Other Services       Functional Status Assessment Patient has had a recent decline in their functional status and demonstrates the ability to make significant improvements in function in a reasonable and predictable  amount of time.     Precautions / Restrictions Precautions Precautions: Posterior Hip Precaution Booklet Issued: No Restrictions Weight Bearing Restrictions: Yes RLE Weight Bearing: Weight bearing as tolerated      Mobility  Bed Mobility Overal bed mobility: Needs Assistance Bed Mobility: Rolling, Supine to Sit, Sit to Supine Rolling: Min assist   Supine to sit: +2 for physical assistance, Min assist, HOB elevated, +2 for safety/equipment Sit to supine: +2 for physical assistance, Min assist, +2 for safety/equipment   General bed mobility comments: Pt is +1 min A for rolling cues for hand reaching and placement, +2 for further bed mobility due to pt's cognitive status, unable to respond to cues provided.    Transfers                   General transfer comment: Unsafe at this time, STS attempted with Total A +2, unsafe with inability to maintain precautions. Provided max cues for stand, with pt showing inability to weight bear with either leg. Attempted to assist pt with setup for LE's before stand but pt immediately reverted LE's back to prior position,     Ambulation/Gait               General Gait Details: deferred, unsafe  Stairs            Wheelchair Mobility     Tilt Bed    Modified Rankin (Stroke Patients Only)       Balance Overall balance assessment: Needs assistance Sitting-balance support: Bilateral upper extremity supported Sitting balance-Leahy Scale: Good Sitting balance - Comments: pt calm, able to sit on EOB without assist   Standing balance support: Reliant on assistive device for balance, During functional  activity, Bilateral upper extremity supported Standing balance-Leahy Scale: Zero Standing balance comment: unable to stand with relying completely on assistance from PT                             Pertinent Vitals/Pain Pain Assessment Pain Assessment: Faces Faces Pain Scale: Hurts a little bit Pain Location:  R hip Pain Descriptors / Indicators: Grimacing Pain Intervention(s): Monitored during session, Limited activity within patient's tolerance    Home Living Family/patient expects to be discharged to:: Other (Comment)                   Additional Comments: Pt has severe dementia and confusion, unable to assess home/prior living situaiton. Per chart review pt coming from [facility]    Prior Function Prior Level of Function : Patient poor historian/Family not available                     Extremity/Trunk Assessment   Upper Extremity Assessment Upper Extremity Assessment: Generalized weakness    Lower Extremity Assessment Lower Extremity Assessment: Generalized weakness       Communication   Communication Communication: Difficulty communicating thoughts/reduced clarity of speech Cueing Techniques: Verbal cues;Gestural cues;Visual cues;Tactile cues  Cognition Arousal: Alert Behavior During Therapy: WFL for tasks assessed/performed, Flat affect Overall Cognitive Status: No family/caregiver present to determine baseline cognitive functioning                                 General Comments: Pt presenting with cognitive impairments, unable to assess prior ability        General Comments      Exercises     Assessment/Plan    PT Assessment Patient needs continued PT services  PT Problem List Decreased strength;Decreased coordination;Decreased range of motion;Decreased cognition;Decreased activity tolerance;Decreased balance;Decreased mobility;Decreased knowledge of precautions;Decreased knowledge of use of DME;Decreased safety awareness       PT Treatment Interventions DME instruction;Balance training;Gait training;Stair training;Functional mobility training;Therapeutic activities;Therapeutic exercise;Patient/family education    PT Goals (Current goals can be found in the Care Plan section)  Acute Rehab PT Goals PT Goal Formulation: Patient  unable to participate in goal setting    Frequency Min 1X/week     Co-evaluation               AM-PAC PT "6 Clicks" Mobility  Outcome Measure Help needed turning from your back to your side while in a flat bed without using bedrails?: A Little Help needed moving from lying on your back to sitting on the side of a flat bed without using bedrails?: A Lot Help needed moving to and from a bed to a chair (including a wheelchair)?: Total Help needed standing up from a chair using your arms (e.g., wheelchair or bedside chair)?: Total Help needed to walk in hospital room?: Total Help needed climbing 3-5 steps with a railing? : Total 6 Click Score: 9    End of Session Equipment Utilized During Treatment: Gait belt Activity Tolerance: Other (comment) (activity limited due to cognitive status) Patient left: in bed;with call bell/phone within reach;with bed alarm set, SCD's reapplied Nurse Communication: Mobility status;Precautions;Weight bearing status PT Visit Diagnosis: Other abnormalities of gait and mobility (R26.89);Unsteadiness on feet (R26.81);Difficulty in walking, not elsewhere classified (R26.2);Muscle weakness (generalized) (M62.81)    Time: 5956-3875 PT Time Calculation (min) (ACUTE ONLY): 36 min   Charges:  Cecile Sheerer, SPT 12/08/22, 1:19 PM

## 2022-12-08 NOTE — Progress Notes (Signed)
PROGRESS NOTE    Kaylee Robinson  NWG:[id number] DOB: 1945-05-13 DOA: 12/07/2022 PCP: [facility]  Assessment & Plan:   Principal Problem:   Closed right hip fracture (HCC) Active Problems:   Dementia with behavioral disturbance (HCC)   Anxiety and depression   Protein-calorie malnutrition, severe  Assessment and Plan: Closed right hip fracture: s/p right hip bipolar hemiarthroplasty on 12/07/22. Norco, morphine prn for pain. Ortho surg following and recs apprec  Acute blood loss anemia: likely secondary to recent above surg. No need for a transfusion currently   Dementia: continue on home dose of zyprexa, haldol, namenda, aricept    Depression: severity unknown. Continue on home dose of zoloft, buspar, depakote, trazodone        DVT prophylaxis: SCDs Code Status: full  Family Communication:  Disposition Plan: depends on PT/OT recs   Level of care: Med-Surg Status is: Inpatient Remains inpatient appropriate because: severity of illness    Consultants:  Ortho surg   Procedures:   Antimicrobials:  Subjective: Pt c/o malaise   Objective: Vitals:   12/07/22 2013 12/07/22 2340 12/08/22 0346 12/08/22 0757  BP: 99/62 98/62 96/62  103/64  Pulse: 68 65 (!) 58 69  Resp: 14 14 14 14   Temp: 97.8 F (36.6 C) 98 F (36.7 C) 97.9 F (36.6 C) 98 F (36.7 C)  TempSrc:      SpO2: 94% 100% 100% 97%  Weight:      Height:        Intake/Output Summary (Last 24 hours) at 12/08/2022 0844 Last data filed at 12/08/2022 0410 Gross per 24 hour  Intake 1050 ml  Output 600 ml  Net 450 ml   Filed Weights   12/06/22 2238  Weight: 48.1 kg    Examination:  General exam: Appears calm but uncomfortable  Respiratory system: clear breath sounds b/l  Cardiovascular system: S1/S2+. No rubs or clicks  Gastrointestinal system: abd is soft, NT, ND, hypoactive bowel sounds  Central nervous system: alert & awake  Psychiatry: Judgement and insight appears at baseline.  Flat mood and affect    Data Reviewed: I have personally reviewed following labs and imaging studies  CBC: Recent Labs  Lab 12/07/22 0030 12/07/22 0310 12/08/22 0505  WBC 13.8* 12.4* 6.9  NEUTROABS 11.6*  --   --   HGB 11.7* 11.7* 9.7*  HCT 36.7 36.4 29.5*  MCV 92.2 91.2 90.5  PLT 258 265 226   Basic Metabolic Panel: Recent Labs  Lab 12/07/22 0030 12/07/22 0310 12/08/22 0505  NA 137 137 135  K 4.3 4.2 4.9  CL 103 103 102  CO2 25 24 25   GLUCOSE 116* 122* 123*  BUN 24* 23 26*  CREATININE 0.67 0.65 0.72  CALCIUM 8.1* 8.6* 8.1*   GFR: Estimated Creatinine Clearance: 45.4 mL/min (by C-G formula based on SCr of 0.72 mg/dL). Liver Function Tests: No results for input(s): "AST", "ALT", "ALKPHOS", "BILITOT", "PROT", "ALBUMIN" in the last 168 hours. No results for input(s): "LIPASE", "AMYLASE" in the last 168 hours. No results for input(s): "AMMONIA" in the last 168 hours. Coagulation Profile: Recent Labs  Lab 12/07/22 0030  INR 1.1   Cardiac Enzymes: No results for input(s): "CKTOTAL", "CKMB", "CKMBINDEX", "TROPONINI" in the last 168 hours. BNP (last 3 results) No results for input(s): "PROBNP" in the last 8760 hours. HbA1C: No results for input(s): "HGBA1C" in the last 72 hours. CBG: No results for input(s): "GLUCAP" in the last 168 hours. Lipid Profile: No results for input(s): "CHOL", "HDL", "LDLCALC", "  TRIG", "CHOLHDL", "LDLDIRECT" in the last 72 hours. Thyroid Function Tests: No results for input(s): "TSH", "T4TOTAL", "FREET4", "T3FREE", "THYROIDAB" in the last 72 hours. Anemia Panel: No results for input(s): "VITAMINB12", "FOLATE", "FERRITIN", "TIBC", "IRON", "RETICCTPCT" in the last 72 hours. Sepsis Labs: No results for input(s): "PROCALCITON", "LATICACIDVEN" in the last 168 hours.  No results found for this or any previous visit (from the past 240 hour(s)).       Radiology Studies: DG HIP UNILAT W OR W/O PELVIS 2-3 VIEWS RIGHT  Result Date:  12/07/2022 CLINICAL DATA:  [id number] Status post hip hemiarthroplasty [id number] EXAM: DG HIP (WITH OR WITHOUT PELVIS) 2-3V RIGHT COMPARISON:  12/06/22 FINDINGS: Right hip arthroplasty in expected alignment. No periprosthetic lucency or fracture. Recent postsurgical change includes air and edema in the soft tissues. Lateral skin staples in place. Prior left femur surgery. IMPRESSION: Right hip arthroplasty without immediate postoperative complication. Electronically Signed   By: Narda Rutherford M.D.   On: 12/07/2022 17:23   DG Wrist Complete Right  Result Date: 12/07/2022 CLINICAL DATA:  Right wrist pain following fall, initial encounter EXAM: RIGHT WRIST - COMPLETE 3+ VIEW COMPARISON:  11/20/2022 FINDINGS: No acute fracture or dislocation is noted. Sclerosis is noted in the radial metaphysis distally consistent with healing fracture. No soft tissue changes are noted. IMPRESSION: Healing distal fracture of the radius.  No acute abnormality noted. Electronically Signed   By: Alcide Clever M.D.   On: 12/07/2022 02:20   DG Elbow Complete Right  Result Date: 12/07/2022 CLINICAL DATA:  Recent fall with right elbow pain, initial encounter EXAM: RIGHT ELBOW - COMPLETE 3+ VIEW COMPARISON:  None Available. FINDINGS: There is no evidence of fracture, dislocation, or joint effusion. There is no evidence of arthropathy or other focal bone abnormality. Soft tissues are unremarkable. IMPRESSION: No acute abnormality noted. Electronically Signed   By: Alcide Clever M.D.   On: 12/07/2022 02:19   DG Chest 1 View  Result Date: 12/06/2022 CLINICAL DATA:  Fall, hip fracture. EXAM: CHEST  1 VIEW COMPARISON:  Chest radiograph 09/19/2018 FINDINGS: The heart is normal in size. Mediastinal contours are in unchanged. No focal airspace disease, large pleural effusion, or pneumothorax. Normal pulmonary vasculature. No evidence of displaced rib fracture or acute osseous finding. IMPRESSION: No acute findings. Electronically Signed   By:  Narda Rutherford M.D.   On: 12/06/2022 23:28   DG HIP UNILAT WITH PELVIS 2-3 VIEWS RIGHT  Result Date: 12/06/2022 CLINICAL DATA:  Right hip pain, fall getting out of wheelchair. EXAM: DG HIP (WITH OR WITHOUT PELVIS) 2-3V RIGHT COMPARISON:  Radiograph 11/19/2020 FINDINGS: Displaced right femoral neck fracture. There is mild proximal migration of the femoral shaft. The femoral head is seated. The bones are diffusely under mineralized. The pubic rami are intact. Left proximal femur hardware is partially included. There is soft tissue edema lateral to the fracture site. IMPRESSION: Displaced right femoral neck fracture. Electronically Signed   By: Narda Rutherford M.D.   On: 12/06/2022 23:26        Scheduled Meds:  acetaminophen  500 mg Oral Q6H   docusate sodium  100 mg Oral BID   enoxaparin (LOVENOX) injection  40 mg Subcutaneous Q24H   feeding supplement  237 mL Oral TID BM   ferrous sulfate  325 mg Oral TID WC   memantine  5 mg Oral BID   multivitamin with minerals  1 tablet Oral Daily   Continuous Infusions:  sodium chloride 75 mL/hr at 12/08/22 1914  LOS: 1 day     Charise Killian, MD [facility] Pager [phone]  If 7PM-7AM, please contact night-coverage [email or web address] 12/08/2022, 8:44 AM

## 2022-12-08 NOTE — Plan of Care (Signed)

## 2022-12-08 NOTE — Progress Notes (Signed)
  Subjective: 1 Day Post-Op Procedure(s) (LRB): ARTHROPLASTY BIPOLAR HIP (HEMIARTHROPLASTY) (Right) Patient denies any pain this morning.  Does not remember surgery. Patient is  sleeping well this morning without any signs of pain. PT and care management to assist with discharge planning. Negative for chest pain and shortness of breath Fever: no Gastrointestinal:Negative for nausea and vomiting  Objective: Vital signs in last 24 hours: Temp:  [96.9 F (36.1 C)-98.4 F (36.9 C)] 97.9 F (36.6 C) (09/20 0346) Pulse Rate:  [58-70] 58 (09/20 0346) Resp:  [11-20] 14 (09/20 0346) BP: (96-140)/(57-69) 96/62 (09/20 0346) SpO2:  [91 %-100 %] 100 % (09/20 0346)  Intake/Output from previous day:  Intake/Output Summary (Last 24 hours) at 12/08/2022 0657 Last data filed at 12/08/2022 0410 Gross per 24 hour  Intake 1050 ml  Output 600 ml  Net 450 ml    Intake/Output this shift: Total I/O In: -  Out: 250 [Urine:250]  Labs: Recent Labs    12/07/22 0030 12/07/22 0310 12/08/22 0505  HGB 11.7* 11.7* 9.7*   Recent Labs    12/07/22 0310 12/08/22 0505  WBC 12.4* 6.9  RBC 3.99 3.26*  HCT 36.4 29.5*  PLT 265 226   Recent Labs    12/07/22 0310 12/08/22 0505  NA 137 135  K 4.2 4.9  CL 103 102  CO2 24 25  BUN 23 26*  CREATININE 0.65 0.72  GLUCOSE 122* 123*  CALCIUM 8.6* 8.1*   Recent Labs    12/07/22 0030  INR 1.1     EXAM General - Patient is Confused and Lacking Extremity - ABD soft Intact pulses distally Dorsiflexion/Plantar flexion intact Incision: scant drainage No cellulitis present Dressing/Incision - Mild bloody drainage noted to the right hip honeycomb dressing. Motor Function - intact, moving foot and toes well on exam.   Past Medical History:  Diagnosis Date   Alzheimer disease (HCC)    Arthritis    bilateral knees   Hypertension    Vascular dementia (HCC)     Assessment/Plan: 1 Day Post-Op Procedure(s) (LRB): ARTHROPLASTY BIPOLAR HIP  (HEMIARTHROPLASTY) (Right) Principal Problem:   Closed right hip fracture (HCC) Active Problems:   Dementia with behavioral disturbance (HCC)   Anxiety and depression   Protein-calorie malnutrition, severe  Estimated body mass index is 17.11 kg/m as calculated from the following:   Height as of this encounter: 5\' 6"  (1.676 m).   Weight as of this encounter: 48.1 kg. Advance diet Up with therapy D/C IV fluids whent tolerating po intake.  Labs and vitals reviewed this AM.  WBC 6.9 this morning.  Hg 9.7. Patient with history of dementia.  Will see how she performs with PT.  Was at a assisted living facility prior to fall. Continue to work on a BM.  DVT Prophylaxis - Lovenox and TED hose Weight-Bearing as tolerated to right leg  J. Horris Latino, PA-C [facility] Orthopaedic Surgery 12/08/2022, 6:57 AM

## 2022-12-09 DIAGNOSIS — S72001A Fracture of unspecified part of neck of right femur, initial encounter for closed fracture: Secondary | ICD-10-CM | POA: Diagnosis not present

## 2022-12-09 LAB — CBC
HCT: 26.1 % — ABNORMAL LOW (ref 36.0–46.0)
Hemoglobin: 8.6 g/dL — ABNORMAL LOW (ref 12.0–15.0)
MCH: 29.4 pg (ref 26.0–34.0)
MCHC: 33 g/dL (ref 30.0–36.0)
MCV: 89.1 fL (ref 80.0–100.0)
Platelets: 218 10*3/uL (ref 150–400)
RBC: 2.93 MIL/uL — ABNORMAL LOW (ref 3.87–5.11)
RDW: 12.2 % (ref 11.5–15.5)
WBC: 7.9 10*3/uL (ref 4.0–10.5)
nRBC: 0 % (ref 0.0–0.2)

## 2022-12-09 LAB — BASIC METABOLIC PANEL
Anion gap: 8 (ref 5–15)
BUN: 44 mg/dL — ABNORMAL HIGH (ref 8–23)
CO2: 23 mmol/L (ref 22–32)
Calcium: 7.8 mg/dL — ABNORMAL LOW (ref 8.9–10.3)
Chloride: 101 mmol/L (ref 98–111)
Creatinine, Ser: 0.86 mg/dL (ref 0.44–1.00)
GFR, Estimated: >60 mL/min (ref >60)
Glucose, Bld: 108 mg/dL — ABNORMAL HIGH (ref 70–99)
Potassium: 4.4 mmol/L (ref 3.5–5.1)
Sodium: 132 mmol/L — ABNORMAL LOW (ref 135–145)

## 2022-12-09 MED ORDER — QUETIAPINE FUMARATE 25 MG PO TABS
25.0000 mg | ORAL_TABLET | Freq: Two times a day (BID) | ORAL | Status: DC | PRN
  Administered 2022-12-09 – 2022-12-12 (×4): 25 mg via ORAL
  Filled 2022-12-09 (×4): qty 1

## 2022-12-09 NOTE — Plan of Care (Signed)

## 2022-12-09 NOTE — Progress Notes (Signed)
   Subjective: 2 Days Post-Op Procedure(s) (LRB): ARTHROPLASTY BIPOLAR HIP (HEMIARTHROPLASTY) (Right) Patient demented and not able to give a rating on her pain level Patient is well, and has had no acute complaints or problems Continue with physical therapy Plan is to go to rehab after hospital stay no nausea and no vomiting Patient denies any chest pains or shortness of breath. Objective: Vital signs in last 24 hours: Temp:  [97.5 F (36.4 C)-98 F (36.7 C)] 98 F (36.7 C) (09/20 2344) Pulse Rate:  [68-76] 68 (09/20 2344) Resp:  [15-18] 16 (09/20 2344) BP: (107-118)/(58-67) 112/59 (09/20 2344) SpO2:  [94 %-96 %] 94 % (09/20 2344) Mild drainage to distal bandage. Heels are non tender and elevated off the bed using rolled towels Intake/Output from previous day: 09/20 0701 - 09/21 0700 In: 1895.3 [I.V.:1895.3] Out: 250 [Urine:250] Intake/Output this shift: Total I/O In: -  Out: 1000 [Urine:1000]  Recent Labs    12/07/22 0030 12/07/22 0310 12/08/22 0505 12/09/22 0404  HGB 11.7* 11.7* 9.7* 8.6*   Recent Labs    12/08/22 0505 12/09/22 0404  WBC 6.9 7.9  RBC 3.26* 2.93*  HCT 29.5* 26.1*  PLT 226 218   Recent Labs    12/08/22 0505 12/09/22 0404  NA 135 132*  K 4.9 4.4  CL 102 101  CO2 25 23  BUN 26* 44*  CREATININE 0.72 0.86  GLUCOSE 123* 108*  CALCIUM 8.1* 7.8*   Recent Labs    12/07/22 0030  INR 1.1    EXAM General - Patient is quite confused Extremity - Neurologically intact Neurovascular intact Sensation intact distally Intact pulses distally Dorsiflexion/Plantar flexion intact No cellulitis present Dressing -no drainage noted to the dressing today Motor Function - intact, moving foot and toes well on exam.  Able to do straight leg raise on her own  Past Medical History:  Diagnosis Date   Alzheimer disease (HCC)    Arthritis    bilateral knees   Hypertension    Vascular dementia (HCC)     Assessment/Plan: 2 Days Post-Op  Procedure(s) (LRB): ARTHROPLASTY BIPOLAR HIP (HEMIARTHROPLASTY) (Right) Principal Problem:   Closed right hip fracture (HCC) Active Problems:   Dementia with behavioral disturbance (HCC)   Anxiety and depression   Protein-calorie malnutrition, severe  Estimated body mass index is 17.11 kg/m as calculated from the following:   Height as of this encounter: 5\' 6"  (1.676 m).   Weight as of this encounter: 48.1 kg. Advance diet Up with therapy Discharge rehab when medically stable  Labs: None DVT Prophylaxis - Aspirin and TED hose Weight-Bearing as tolerated to left leg D/C O2 and Pulse OX and try on Room Air  Evelio Rueda R. [facility] PA [facility] 12/09/2022, 8:49 AM

## 2022-12-09 NOTE — Progress Notes (Signed)
PROGRESS NOTE    Kaylee Robinson  ZOX:[id number] DOB: [birth year] DOA: 12/07/2022 PCP: [facility]  Assessment & Plan:   Principal Problem:   Closed right hip fracture (HCC) Active Problems:   Dementia with behavioral disturbance (HCC)   Anxiety and depression   Protein-calorie malnutrition, severe  Assessment and Plan: Closed right hip fracture: s/p right hip bipolar hemiarthroplasty on 12/07/22. Norco, morphine prn for pain. PT/OT recs SNF. Ortho surg following and recs apprec  Acute blood loss anemia: likely secondary to recent above surg. No need for a transfusion currently    Dementia: continue on home dose of namenda, aricept, haldol, zyprexa   Depression: severity unknown. Continue on home dose of depakote, buspar, zoloft, trazodone, depakote         DVT prophylaxis: SCDs Code Status: full  Family Communication:  Disposition Plan: PT/OT recs SNF   Level of care: Med-Surg Status is: Inpatient Remains inpatient appropriate because: severity of illness    Consultants:  Ortho surg   Procedures:   Antimicrobials:  Subjective: Pt is pleasantly confused   Objective: Vitals:   12/08/22 0757 12/08/22 1330 12/08/22 1637 12/08/22 2344  BP: 103/64 (!) 107/58 118/67 (!) 112/59  Pulse: 69 76 71 68  Resp: 14 18 15 16   Temp: 98 F (36.7 C) 97.6 F (36.4 C) (!) 97.5 F (36.4 C) 98 F (36.7 C)  TempSrc: Axillary     SpO2: 97% 96% 96% 94%  Weight:      Height:        Intake/Output Summary (Last 24 hours) at 12/09/2022 0829 Last data filed at 12/09/2022 8119 Gross per 24 hour  Intake 1895.29 ml  Output 1250 ml  Net 645.29 ml   Filed Weights   12/06/22 2238  Weight: 48.1 kg    Examination:  General exam: Appears pleasantly confused  Respiratory system: clear breath sounds b/l  Cardiovascular system: S1 & S2+. No rubs or clicks  Gastrointestinal system: abd is soft, NT, ND & hypoactive bowel sounds Central nervous system: alert & awake   Psychiatry: Judgement and insight appears poor.     Data Reviewed: I have personally reviewed following labs and imaging studies  CBC: Recent Labs  Lab 12/07/22 0030 12/07/22 0310 12/08/22 0505 12/09/22 0404  WBC 13.8* 12.4* 6.9 7.9  NEUTROABS 11.6*  --   --   --   HGB 11.7* 11.7* 9.7* 8.6*  HCT 36.7 36.4 29.5* 26.1*  MCV 92.2 91.2 90.5 89.1  PLT 258 265 226 218   Basic Metabolic Panel: Recent Labs  Lab 12/07/22 0030 12/07/22 0310 12/08/22 0505 12/09/22 0404  NA 137 137 135 132*  K 4.3 4.2 4.9 4.4  CL 103 103 102 101  CO2 25 24 25 23   GLUCOSE 116* 122* 123* 108*  BUN 24* 23 26* 44*  CREATININE 0.67 0.65 0.72 0.86  CALCIUM 8.1* 8.6* 8.1* 7.8*   GFR: Estimated Creatinine Clearance: 42.3 mL/min (by C-G formula based on SCr of 0.86 mg/dL). Liver Function Tests: No results for input(s): "AST", "ALT", "ALKPHOS", "BILITOT", "PROT", "ALBUMIN" in the last 168 hours. No results for input(s): "LIPASE", "AMYLASE" in the last 168 hours. No results for input(s): "AMMONIA" in the last 168 hours. Coagulation Profile: Recent Labs  Lab 12/07/22 0030  INR 1.1   Cardiac Enzymes: No results for input(s): "CKTOTAL", "CKMB", "CKMBINDEX", "TROPONINI" in the last 168 hours. BNP (last 3 results) No results for input(s): "PROBNP" in the last 8760 hours. HbA1C: No results for input(s): "HGBA1C" in  the last 72 hours. CBG: No results for input(s): "GLUCAP" in the last 168 hours. Lipid Profile: No results for input(s): "CHOL", "HDL", "LDLCALC", "TRIG", "CHOLHDL", "LDLDIRECT" in the last 72 hours. Thyroid Function Tests: No results for input(s): "TSH", "T4TOTAL", "FREET4", "T3FREE", "THYROIDAB" in the last 72 hours. Anemia Panel: No results for input(s): "VITAMINB12", "FOLATE", "FERRITIN", "TIBC", "IRON", "RETICCTPCT" in the last 72 hours. Sepsis Labs: No results for input(s): "PROCALCITON", "LATICACIDVEN" in the last 168 hours.  No results found for this or any previous visit  (from the past 240 hour(s)).       Radiology Studies: DG HIP UNILAT W OR W/O PELVIS 2-3 VIEWS RIGHT  Result Date: 12/07/2022 CLINICAL DATA:  [id number] Status post hip hemiarthroplasty [id number] EXAM: DG HIP (WITH OR WITHOUT PELVIS) 2-3V RIGHT COMPARISON:  12/06/22 FINDINGS: Right hip arthroplasty in expected alignment. No periprosthetic lucency or fracture. Recent postsurgical change includes air and edema in the soft tissues. Lateral skin staples in place. Prior left femur surgery. IMPRESSION: Right hip arthroplasty without immediate postoperative complication. Electronically Signed   By: Narda Rutherford M.D.   On: 12/07/2022 17:23        Scheduled Meds:  docusate sodium  100 mg Oral BID   enoxaparin (LOVENOX) injection  40 mg Subcutaneous Q24H   feeding supplement  237 mL Oral TID BM   ferrous sulfate  325 mg Oral TID WC   memantine  5 mg Oral BID   multivitamin with minerals  1 tablet Oral Daily   Continuous Infusions:  sodium chloride 75 mL/hr at 12/08/22 0529     LOS: 2 days     Charise Killian, MD [facility] Pager [phone]  If 7PM-7AM, please contact night-coverage [email or web address] 12/09/2022, 8:29 AM

## 2022-12-10 DIAGNOSIS — S72001A Fracture of unspecified part of neck of right femur, initial encounter for closed fracture: Secondary | ICD-10-CM | POA: Diagnosis not present

## 2022-12-10 LAB — CBC
HCT: 28.4 % — ABNORMAL LOW (ref 36.0–46.0)
Hemoglobin: 9.3 g/dL — ABNORMAL LOW (ref 12.0–15.0)
MCH: 29.8 pg (ref 26.0–34.0)
MCHC: 32.7 g/dL (ref 30.0–36.0)
MCV: 91 fL (ref 80.0–100.0)
Platelets: 256 10*3/uL (ref 150–400)
RBC: 3.12 MIL/uL — ABNORMAL LOW (ref 3.87–5.11)
RDW: 12.7 % (ref 11.5–15.5)
WBC: 8.1 10*3/uL (ref 4.0–10.5)
nRBC: 0 % (ref 0.0–0.2)

## 2022-12-10 LAB — BASIC METABOLIC PANEL
Anion gap: 7 (ref 5–15)
BUN: 42 mg/dL — ABNORMAL HIGH (ref 8–23)
CO2: 26 mmol/L (ref 22–32)
Calcium: 8.1 mg/dL — ABNORMAL LOW (ref 8.9–10.3)
Chloride: 103 mmol/L (ref 98–111)
Creatinine, Ser: 0.79 mg/dL (ref 0.44–1.00)
GFR, Estimated: >60 mL/min (ref >60)
Glucose, Bld: 116 mg/dL — ABNORMAL HIGH (ref 70–99)
Potassium: 5 mmol/L (ref 3.5–5.1)
Sodium: 136 mmol/L (ref 135–145)

## 2022-12-10 NOTE — TOC Initial Note (Signed)
Transition of Care [facility]) - Initial/Assessment Note    Patient Details  Name: Kaylee Robinson MRN: [id number] Date of Birth: 01-02-1946  [facility]) CM/SW Contact:    Susa Simmonds, LCSWA Phone Number: 12/10/2022, 10:30 AM  Clinical Narrative:  CSW spoke with Vilinda Blanks, Director of [facility]. CSW was informed they have a protective order in place that gives them the right to make decisions for patient. CSW was told Crystal Mat Carne is patients current worker at [employer]. CSW was told to go ahead and start the SNF request if that was recommended.               Expected Discharge Plan: Skilled Nursing Facility Barriers to Discharge: Continued Medical Work up   Patient Goals and CMS Choice Patient states their goals for this hospitalization and ongoing recovery are:: Skilled nursing then back to [employer]          Expected Discharge Plan and Services       Living arrangements for the past 2 months:  (Memory Care)                                      Prior Living Arrangements/Services Living arrangements for the past 2 months:  (Memory Care) Lives with:: Facility Resident Patient language and need for interpreter reviewed:: Yes              Criminal Activity/Legal Involvement Pertinent to Current Situation/Hospitalization: No - Comment as needed  Activities of Daily Living      Permission Sought/Granted Permission sought to share information with : Other (comment) ([place] [facility])    Share Information with NAME: Junious Silk  Permission granted to share info w AGENCY: Adult Management consultant     Permission granted to share info w Contact Information: [phone]  Emotional Assessment       Orientation: : Oriented to Self Alcohol / Substance Use: Not Applicable Psych Involvement: No (comment)  Admission diagnosis:  Cellulitis of finger of left hand [L03.012] Closed right hip  fracture (HCC) [S72.001A] Multiple skin tears [T14.8XXA] Closed displaced fracture of right femoral neck (HCC) [S72.001A] Patient Active Problem List   Diagnosis Date Noted   Closed right hip fracture (HCC) 12/07/2022   Dementia with behavioral disturbance (HCC) 12/07/2022   Anxiety and depression 12/07/2022   Protein-calorie malnutrition, severe 12/07/2022   Closed left hip fracture (HCC) 09/20/2018   PCP:  [facility] Pharmacy:  No Pharmacies Listed    Social Determinants of Health (SDOH) Social History: SDOH Screenings   Food Insecurity: No Food Insecurity (06/12/2022)   Received from [facility], [facility]  Transportation Needs: No Transportation Needs (06/12/2022)   Received from [facility], [facility]  Utilities: Not At Risk (06/12/2022)   Received from [facility], [facility]  Financial Resource Strain: Low Risk  (06/12/2022)   Received from [facility], [facility]  Physical Activity: Insufficiently Active (06/12/2022)   Received from [facility], [facility]  Social Connections: Moderately Integrated (06/12/2022)   Received from [facility], [facility]  Stress: Patient Unable To Answer (06/12/2022)   Received from [facility], [facility]  Tobacco Use: High Risk (12/07/2022)   SDOH Interventions:     Readmission Risk Interventions     No data to display

## 2022-12-10 NOTE — Progress Notes (Signed)
PROGRESS NOTE    Kaylee Robinson  YBO:[id number] DOB: Aug 09, 1945 DOA: 12/07/2022 PCP: [facility]  Assessment & Plan:   Principal Problem:   Closed right hip fracture (HCC) Active Problems:   Dementia with behavioral disturbance (HCC)   Anxiety and depression   Protein-calorie malnutrition, severe  Assessment and Plan: Closed right hip fracture: s/p right hip bipolar hemiarthroplasty on 12/07/22. Norco, morphine prn for pain. PT/OT recs SNF. Ortho surg following and recs apprec  Acute blood loss anemia: likely secondary to recent above surg. No need for a transfusion currently    Dementia: w/ behavior disturbance. Continue on haldol, zyprexa, aricept, namenda   Depression: severity unknown. Continue on home dose of depakote, buspar, zoloft, trazodone, depakote         DVT prophylaxis: SCDs Code Status: full  Family Communication:  Disposition Plan: PT/OT recs SNF   Level of care: Med-Surg Status is: Inpatient Remains inpatient appropriate because: severity of illness    Consultants:  Ortho surg   Procedures:   Antimicrobials:  Subjective: Pt is confused   Objective: Vitals:   12/09/22 0901 12/09/22 1645 12/10/22 0122 12/10/22 0746  BP: 117/66 129/63 (!) 126/54 127/66  Pulse: 65 76 71 64  Resp: 16 17 18 17   Temp: 98.1 F (36.7 C) 97.6 F (36.4 C) 98.1 F (36.7 C) 98.1 F (36.7 C)  TempSrc:      SpO2: 100% 97% 98% 99%  Weight:      Height:        Intake/Output Summary (Last 24 hours) at 12/10/2022 0859 Last data filed at 12/10/2022 0707 Gross per 24 hour  Intake 480 ml  Output 1500 ml  Net -1020 ml   Filed Weights   12/06/22 2238  Weight: 48.1 kg    Examination:  General exam: Pt is confused  Respiratory system: clear breath sounds b/l  Cardiovascular system: S1 & S2+. No rubs or clicks  Gastrointestinal system: abd is soft, NT, ND & hypoactive bowel sounds  Central nervous system: alert & awake. Oriented to self only   Psychiatry: Judgement and insight only     Data Reviewed: I have personally reviewed following labs and imaging studies  CBC: Recent Labs  Lab 12/07/22 0030 12/07/22 0310 12/08/22 0505 12/09/22 0404 12/10/22 0348  WBC 13.8* 12.4* 6.9 7.9 8.1  NEUTROABS 11.6*  --   --   --   --   HGB 11.7* 11.7* 9.7* 8.6* 9.3*  HCT 36.7 36.4 29.5* 26.1* 28.4*  MCV 92.2 91.2 90.5 89.1 91.0  PLT 258 265 226 218 256   Basic Metabolic Panel: Recent Labs  Lab 12/07/22 0030 12/07/22 0310 12/08/22 0505 12/09/22 0404 12/10/22 0348  NA 137 137 135 132* 136  K 4.3 4.2 4.9 4.4 5.0  CL 103 103 102 101 103  CO2 25 24 25 23 26   GLUCOSE 116* 122* 123* 108* 116*  BUN 24* 23 26* 44* 42*  CREATININE 0.67 0.65 0.72 0.86 0.79  CALCIUM 8.1* 8.6* 8.1* 7.8* 8.1*   GFR: Estimated Creatinine Clearance: 45.4 mL/min (by C-G formula based on SCr of 0.79 mg/dL). Liver Function Tests: No results for input(s): "AST", "ALT", "ALKPHOS", "BILITOT", "PROT", "ALBUMIN" in the last 168 hours. No results for input(s): "LIPASE", "AMYLASE" in the last 168 hours. No results for input(s): "AMMONIA" in the last 168 hours. Coagulation Profile: Recent Labs  Lab 12/07/22 0030  INR 1.1   Cardiac Enzymes: No results for input(s): "CKTOTAL", "CKMB", "CKMBINDEX", "TROPONINI" in the last 168 hours.  BNP (last 3 results) No results for input(s): "PROBNP" in the last 8760 hours. HbA1C: No results for input(s): "HGBA1C" in the last 72 hours. CBG: No results for input(s): "GLUCAP" in the last 168 hours. Lipid Profile: No results for input(s): "CHOL", "HDL", "LDLCALC", "TRIG", "CHOLHDL", "LDLDIRECT" in the last 72 hours. Thyroid Function Tests: No results for input(s): "TSH", "T4TOTAL", "FREET4", "T3FREE", "THYROIDAB" in the last 72 hours. Anemia Panel: No results for input(s): "VITAMINB12", "FOLATE", "FERRITIN", "TIBC", "IRON", "RETICCTPCT" in the last 72 hours. Sepsis Labs: No results for input(s): "PROCALCITON",  "LATICACIDVEN" in the last 168 hours.  No results found for this or any previous visit (from the past 240 hour(s)).       Radiology Studies: No results found.      Scheduled Meds:  docusate sodium  100 mg Oral BID   enoxaparin (LOVENOX) injection  40 mg Subcutaneous Q24H   feeding supplement  237 mL Oral TID BM   ferrous sulfate  325 mg Oral TID WC   memantine  5 mg Oral BID   multivitamin with minerals  1 tablet Oral Daily   Continuous Infusions:     LOS: 3 days     Charise Killian, MD [facility] Pager [phone]  If 7PM-7AM, please contact night-coverage [email or web address] 12/10/2022, 8:59 AM

## 2022-12-10 NOTE — NC FL2 (Signed)
[place] MEDICAID FL2 LEVEL OF CARE FORM     IDENTIFICATION  Patient Name: Kaylee Robinson Birthdate: 01-Aug-1945 Sex: female Admission Date (Current Location): 12/07/2022  [facility] and [place] Number:  Chiropodist and Address:  [facility], [address], [place], [place] [address]      Provider Number: [id number]  Attending Physician Name and Address:  Charise Killian, MD  Relative Name and Phone Number:  Clay,Crystal [facility])  [phone]    Current Level of Care: Hospital Recommended Level of Care: Skilled Nursing Facility Prior Approval Number:    Date Approved/Denied:   PASRR Number: [id number]  Discharge Plan: SNF    Current Diagnoses: Patient Active Problem List   Diagnosis Date Noted   Closed right hip fracture (HCC) 12/07/2022   Dementia with behavioral disturbance (HCC) 12/07/2022   Anxiety and depression 12/07/2022   Protein-calorie malnutrition, severe 12/07/2022   Closed left hip fracture (HCC) 09/20/2018    Orientation RESPIRATION BLADDER Height & Weight     Self  Normal Incontinent Weight: 106 lb (48.1 kg) Height:  5\' 6"  (167.6 cm)  BEHAVIORAL SYMPTOMS/MOOD NEUROLOGICAL BOWEL NUTRITION STATUS      Incontinent Diet (Heart Healthy)  AMBULATORY STATUS COMMUNICATION OF NEEDS Skin   Extensive Assist Verbally Normal                       Personal Care Assistance Level of Assistance  Bathing, Feeding, Dressing Bathing Assistance: Maximum assistance Feeding assistance: Limited assistance Dressing Assistance: Maximum assistance     Functional Limitations Info  Sight, Hearing, Speech Sight Info: Adequate Hearing Info: Adequate Speech Info: Impaired    SPECIAL CARE FACTORS FREQUENCY  PT (By licensed PT)     PT Frequency: 5X Weekly              Contractures Contractures Info: Not present    Additional Factors Info  Code Status, Allergies Code Status Info:  Full Allergies Info: Naproxen, Enalapril, Amoxicillin, Sulfa Antibiotics           Current Medications (12/10/2022):  This is the current hospital active medication list Current Facility-Administered Medications  Medication Dose Route Frequency Provider Last Rate Last Admin   acetaminophen (TYLENOL) tablet 325-650 mg  325-650 mg Oral Q6H PRN Poggi, Excell Seltzer, MD       alum & mag hydroxide-simeth (MAALOX/MYLANTA) 200-200-20 MG/5ML suspension 30 mL  30 mL Oral Q4H PRN Poggi, Excell Seltzer, MD       bisacodyl (DULCOLAX) suppository 10 mg  10 mg Rectal Daily PRN Poggi, Excell Seltzer, MD       diphenhydrAMINE (BENADRYL) 12.5 MG/5ML elixir 12.5-25 mg  12.5-25 mg Oral Q4H PRN Poggi, Excell Seltzer, MD       docusate sodium (COLACE) capsule 100 mg  100 mg Oral BID Poggi, Excell Seltzer, MD   100 mg at 12/10/22 0905   enoxaparin (LOVENOX) injection 40 mg  40 mg Subcutaneous Q24H Poggi, Excell Seltzer, MD   40 mg at 12/10/22 0905   feeding supplement (ENSURE ENLIVE / ENSURE PLUS) liquid 237 mL  237 mL Oral TID BM Poggi, Excell Seltzer, MD   237 mL at 12/10/22 0906   ferrous sulfate tablet 325 mg  325 mg Oral TID WC Poggi, Excell Seltzer, MD   325 mg at 12/10/22 0905   HYDROcodone-acetaminophen (NORCO/VICODIN) 5-325 MG per tablet 1-2 tablet  1-2 tablet Oral Q6H PRN Poggi, Excell Seltzer, MD   1 tablet at 12/10/22 [phone]  magnesium hydroxide (MILK OF MAGNESIA) suspension 30 mL  30 mL Oral Daily PRN Poggi, Excell Seltzer, MD       magnesium hydroxide (MILK OF MAGNESIA) suspension 30 mL  30 mL Oral Daily PRN Poggi, Excell Seltzer, MD       melatonin tablet 5 mg  5 mg Oral QHS PRN Poggi, Excell Seltzer, MD       memantine [facility]) tablet 5 mg  5 mg Oral BID Poggi, Excell Seltzer, MD   5 mg at 12/10/22 4098   metoCLOPramide (REGLAN) tablet 5-10 mg  5-10 mg Oral Q8H PRN Poggi, Excell Seltzer, MD       Or   metoCLOPramide (REGLAN) injection 5-10 mg  5-10 mg Intravenous Q8H PRN Poggi, Excell Seltzer, MD       morphine (PF) 2 MG/ML injection 0.5 mg  0.5 mg Intravenous Q2H PRN Poggi, Excell Seltzer, MD   0.5 mg at 12/07/22 1191    multivitamin with minerals tablet 1 tablet  1 tablet Oral Daily Poggi, Excell Seltzer, MD   1 tablet at 12/10/22 0905   ondansetron (ZOFRAN) tablet 4 mg  4 mg Oral Q6H PRN Poggi, Excell Seltzer, MD       Or   ondansetron (ZOFRAN) injection 4 mg  4 mg Intravenous Q6H PRN Poggi, Excell Seltzer, MD       polyethylene glycol (MIRALAX / GLYCOLAX) packet 17 g  17 g Oral Daily PRN Poggi, Excell Seltzer, MD   17 g at 12/09/22 1104   QUEtiapine (SEROQUEL) tablet 25 mg  25 mg Oral BID PRN Charise Killian, MD   25 mg at 12/09/22 1656   sodium phosphate (FLEET) enema 1 enema  1 enema Rectal Once PRN Poggi, Excell Seltzer, MD       traZODone (DESYREL) tablet 25 mg  25 mg Oral QHS PRN Poggi, Excell Seltzer, MD   25 mg at 12/09/22 0010     Discharge Medications: Please see discharge summary for a list of discharge medications.  Relevant Imaging Results:  Relevant Lab Results:   Additional Information SSN: [id number]  Susa Simmonds, LCSWA

## 2022-12-10 NOTE — Progress Notes (Signed)
.................    Subjective: 3 Days Post-Op Procedure(s) (LRB): ARTHROPLASTY BIPOLAR HIP (HEMIARTHROPLASTY) (Right) Patient reports pain as 2 on 0-10 scale.   Patient is well, and has had no acute complaints or problems Continue with physical therapy no nausea and no vomiting Patient denies any chest pains or shortness of breath. Objective: Vital signs in last 24 hours: Temp:  [97.6 F (36.4 C)-98.1 F (36.7 C)] 98.1 F (36.7 C) (09/22 0746) Pulse Rate:  [64-76] 64 (09/22 0746) Resp:  [16-18] 17 (09/22 0746) BP: (117-129)/(54-66) 127/66 (09/22 0746) SpO2:  [97 %-100 %] 99 % (09/22 0746) Mild drainage to distal bandage. Heels are non tender and elevated off the bed using rolled towels Had a hard time getting the patient to respond to verbal commands of movement of the right lower extremity.  Appears to have good gross motor strength and normal sensation to touch. Intake/Output from previous day: 09/21 0701 - 09/22 0700 In: 480 [P.O.:480] Out: 2100 [Urine:2100] Intake/Output this shift: Total I/O In: -  Out: 400 [Urine:400]  Recent Labs    12/08/22 0505 12/09/22 0404 12/10/22 0348  HGB 9.7* 8.6* 9.3*   Recent Labs    12/09/22 0404 12/10/22 0348  WBC 7.9 8.1  RBC 2.93* 3.12*  HCT 26.1* 28.4*  PLT 218 256   Recent Labs    12/09/22 0404 12/10/22 0348  NA 132* 136  K 4.4 5.0  CL 101 103  CO2 23 26  BUN 44* 42*  CREATININE 0.86 0.79  GLUCOSE 108* 116*  CALCIUM 7.8* 8.1*   No results for input(s): "LABPT", "INR" in the last 72 hours.  EXAM General - Patient is Alert, Appropriate, and Oriented Extremity - Neurologically intact Neurovascular intact Sensation intact distally Intact pulses distally Dorsiflexion/Plantar flexion intact No cellulitis present Dressing -mild drainage to the distal dressing Motor Function - intact, moving foot and toes well on exam.  Able to do straight leg raise on her own  Past Medical History:  Diagnosis Date   Alzheimer  disease (HCC)    Arthritis    bilateral knees   Hypertension    Vascular dementia (HCC)     Assessment/Plan: 3 Days Post-Op Procedure(s) (LRB): ARTHROPLASTY BIPOLAR HIP (HEMIARTHROPLASTY) (Right) Principal Problem:   Closed right hip fracture (HCC) Active Problems:   Dementia with behavioral disturbance (HCC)   Anxiety and depression   Protein-calorie malnutrition, severe  Estimated body mass index is 17.11 kg/m as calculated from the following:   Height as of this encounter: 5\' 6"  (1.676 m).   Weight as of this encounter: 48.1 kg. Advance diet Up with therapy Waiting placement for rehab  Labs: None DVT Prophylaxis - Lovenox for 2 weeks and TED hose Weight-Bearing as tolerated to right leg  Mackenzie Groom R. [facility] PA [facility] 12/10/2022, 8:24 AM

## 2022-12-10 NOTE — Plan of Care (Signed)

## 2022-12-10 NOTE — Progress Notes (Signed)
Physical Therapy Treatment Patient Details Name: Maricruz Muhlestein MRN: [id number] DOB: 1945/10/24 Today's Date: 12/10/2022   History of Present Illness Patient is a 77 y.o. Caucasian female with medical history significant for dementia (Alzheimer's) and hypertension, who presented to the emergency room with acute onset of fall from her wheelchair with subsequent right hip pain. She is now s/p right hip bipolar hemiarthroplasty.    PT Comments  Pt making steady progress with functional mobility. She was able to tolerate sit<>stand and step-pivot transfers from her bed to the recliner chair with use of RW and mod A from PT. Pt remains significantly limited with functional mobility secondary to post-op pain and weakness, and would greatly benefit from further intensive therapy services at a SNF at this time. Pt would continue to benefit from skilled physical therapy services at this time while admitted and after d/c to address the below listed limitations in order to improve overall safety and independence with functional mobility.     If plan is discharge home, recommend the following: A lot of help with bathing/dressing/bathroom;Assistance with feeding;Direct supervision/assist for medications management;Direct supervision/assist for financial management;Assist for transportation;Help with stairs or ramp for entrance;Assistance with cooking/housework;Two people to help with walking and/or transfers   Can travel by private vehicle     No  Equipment Recommendations  Other (comment) (defer to next venue of care)    Recommendations for Other Services       Precautions / Restrictions Precautions Precautions: Posterior Hip Precaution Comments: pt unable to participate in posterior hip precaution education and no family/caregivers present to educate Restrictions Weight Bearing Restrictions: Yes RLE Weight Bearing: Weight bearing as tolerated     Mobility  Bed Mobility Overal bed mobility:  Needs Assistance Bed Mobility: Supine to Sit     Supine to sit: Mod assist, HOB elevated     General bed mobility comments: increased time and effort, cueing for sequencing and technique, assistance needed for bilateral LE management off of bed and for trunk elevation, use of bed pads to position pt's hips at EOB    Transfers Overall transfer level: Needs assistance Equipment used: Rolling walker (2 wheels) Transfers: Sit to/from Stand, Bed to chair/wheelchair/BSC Sit to Stand: Mod assist, From elevated surface   Step pivot transfers: Mod assist       General transfer comment: increased time and effort needed, cueing for appropriate positioning of bilateral LEs prior to standing, assistance needed to power up into a full upright standing position and for pivotal steps to recliner chair towards pt's left side.    Ambulation/Gait                   Stairs             Wheelchair Mobility     Tilt Bed    Modified Rankin (Stroke Patients Only)       Balance Overall balance assessment: Needs assistance Sitting-balance support: Feet supported, Single extremity supported, Bilateral upper extremity supported, No upper extremity supported Sitting balance-Leahy Scale: Poor Sitting balance - Comments: pt able to sit EOB without any physical assistance, but required one UE most of the time Postural control: Posterior lean, Left lateral lean Standing balance support: Reliant on assistive device for balance, During functional activity, Bilateral upper extremity supported Standing balance-Leahy Scale: Poor Standing balance comment: mod A                            Cognition Arousal:  Alert Behavior During Therapy: Anxious Overall Cognitive Status: History of cognitive impairments - at baseline                                 General Comments: pt with dementia at baseline. she was alert and able to follow simple one-step commands with max  multimodal cueing        Exercises      General Comments        Pertinent Vitals/Pain Pain Assessment Pain Assessment: Faces Faces Pain Scale: Hurts little more Pain Location: R hip Pain Descriptors / Indicators: Grimacing Pain Intervention(s): Monitored during session, Repositioned    Home Living                          Prior Function            PT Goals (current goals can now be found in the care plan section) Acute Rehab PT Goals PT Goal Formulation: Patient unable to participate in goal setting Progress towards PT goals: Progressing toward goals    Frequency    Min 1X/week      PT Plan      Co-evaluation              AM-PAC PT "6 Clicks" Mobility   Outcome Measure  Help needed turning from your back to your side while in a flat bed without using bedrails?: A Little Help needed moving from lying on your back to sitting on the side of a flat bed without using bedrails?: A Lot Help needed moving to and from a bed to a chair (including a wheelchair)?: A Lot Help needed standing up from a chair using your arms (e.g., wheelchair or bedside chair)?: A Lot Help needed to walk in hospital room?: A Lot Help needed climbing 3-5 steps with a railing? : Total 6 Click Score: 12    End of Session Equipment Utilized During Treatment: Gait belt Activity Tolerance: Patient tolerated treatment well Patient left: in chair;with call bell/phone within reach;with chair alarm set Nurse Communication: Mobility status;Precautions;Weight bearing status PT Visit Diagnosis: Other abnormalities of gait and mobility (R26.89);Pain Pain - Right/Left: Right Pain - part of body: Hip     Time: [id number] PT Time Calculation (min) (ACUTE ONLY): 20 min  Charges:    $Therapeutic Activity: 8-22 mins PT General Charges $$ ACUTE PT VISIT: 1 Visit                     Arletta Bale, DPT  Acute Rehabilitation Services Office [phone]    Alessandra Bevels  Nicolette Gieske 12/10/2022, 10:52 AM

## 2022-12-11 DIAGNOSIS — F03918 Unspecified dementia, unspecified severity, with other behavioral disturbance: Secondary | ICD-10-CM | POA: Diagnosis not present

## 2022-12-11 DIAGNOSIS — S72001A Fracture of unspecified part of neck of right femur, initial encounter for closed fracture: Secondary | ICD-10-CM | POA: Diagnosis not present

## 2022-12-11 LAB — CBC
HCT: 28.9 % — ABNORMAL LOW (ref 36.0–46.0)
Hemoglobin: 9.5 g/dL — ABNORMAL LOW (ref 12.0–15.0)
MCH: 29.6 pg (ref 26.0–34.0)
MCHC: 32.9 g/dL (ref 30.0–36.0)
MCV: 90 fL (ref 80.0–100.0)
Platelets: 318 10*3/uL (ref 150–400)
RBC: 3.21 MIL/uL — ABNORMAL LOW (ref 3.87–5.11)
RDW: 12.7 % (ref 11.5–15.5)
WBC: 7.1 10*3/uL (ref 4.0–10.5)
nRBC: 0 % (ref 0.0–0.2)

## 2022-12-11 LAB — BASIC METABOLIC PANEL
Anion gap: 7 (ref 5–15)
BUN: 35 mg/dL — ABNORMAL HIGH (ref 8–23)
CO2: 24 mmol/L (ref 22–32)
Calcium: 8.5 mg/dL — ABNORMAL LOW (ref 8.9–10.3)
Chloride: 106 mmol/L (ref 98–111)
Creatinine, Ser: 0.73 mg/dL (ref 0.44–1.00)
GFR, Estimated: >60 mL/min (ref >60)
Glucose, Bld: 103 mg/dL — ABNORMAL HIGH (ref 70–99)
Potassium: 4.7 mmol/L (ref 3.5–5.1)
Sodium: 137 mmol/L (ref 135–145)

## 2022-12-11 LAB — SURGICAL PATHOLOGY

## 2022-12-11 MED ORDER — HYDROCODONE-ACETAMINOPHEN 5-325 MG PO TABS: 1.0000 | ORAL_TABLET | Freq: Four times a day (QID) | ORAL | 0 refills | Status: DC | PRN

## 2022-12-11 MED ORDER — ENOXAPARIN SODIUM 40 MG/0.4ML IJ SOSY: 40.0000 mg | PREFILLED_SYRINGE | INTRAMUSCULAR | 0 refills | Status: DC

## 2022-12-11 NOTE — Progress Notes (Signed)
Subjective: 4 Days Post-Op Procedure(s) (LRB): ARTHROPLASTY BIPOLAR HIP (HEMIARTHROPLASTY) (Right) Patient reports pain as mild in the right hip. Patient is well does have dementia.   Continue with physical therapy no nausea and no vomiting Patient denies any chest pains or shortness of breath.  Objective: Vital signs in last 24 hours: Temp:  [97.9 F (36.6 C)-98.4 F (36.9 C)] 98.4 F (36.9 C) (09/23 0040) Pulse Rate:  [59-71] 59 (09/23 0040) Resp:  [16-18] 16 (09/23 0040) BP: (127-148)/(52-66) 127/52 (09/23 0040) SpO2:  [99 %] 99 % (09/23 0040)  Intake/Output from previous day: 09/22 0701 - 09/23 0700 In: 120 [P.O.:120] Out: 2250 [Urine:2250] Intake/Output this shift: No intake/output data recorded.  Recent Labs    12/09/22 0404 12/10/22 0348 12/11/22 0637  HGB 8.6* 9.3* 9.5*   Recent Labs    12/10/22 0348 12/11/22 0637  WBC 8.1 7.1  RBC 3.12* 3.21*  HCT 28.4* 28.9*  PLT 256 318   Recent Labs    12/09/22 0404 12/10/22 0348  NA 132* 136  K 4.4 5.0  CL 101 103  CO2 23 26  BUN 44* 42*  CREATININE 0.86 0.79  GLUCOSE 108* 116*  CALCIUM 7.8* 8.1*   No results for input(s): "LABPT", "INR" in the last 72 hours.  EXAM General - Patient is lacking this morning but is able to answer simple questions this morning. Extremity - Neurologically intact Neurovascular intact Sensation intact distally Intact pulses distally Dorsiflexion/Plantar flexion intact No cellulitis present Dressing -mild drainage to the right hip dressing. Motor Function - intact, moving foot and toes well on exam.  Able to do straight leg raise on her own  Past Medical History:  Diagnosis Date   Alzheimer disease (HCC)    Arthritis    bilateral knees   Hypertension    Vascular dementia (HCC)     Assessment/Plan: 4 Days Post-Op Procedure(s) (LRB): ARTHROPLASTY BIPOLAR HIP (HEMIARTHROPLASTY) (Right) Principal Problem:   Closed right hip fracture (HCC) Active Problems:    Dementia with behavioral disturbance (HCC)   Anxiety and depression   Protein-calorie malnutrition, severe  Estimated body mass index is 17.11 kg/m as calculated from the following:   Height as of this encounter: 5\' 6"  (1.676 m).   Weight as of this encounter: 48.1 kg. Advance diet Up with therapy She has had a BM since surgery.  Plan for discharge to SNF pending insurance approval. Lovenox 40mg  daily for 14 days following discharge for DVT prophylaxis. Follow-up with [facility] in 10-14 days for staple removal.   Weight-Bearing as tolerated to right leg  Jon R. [facility] PA [facility] 12/11/2022, 7:41 AM

## 2022-12-11 NOTE — TOC Progression Note (Signed)
Transition of Care [facility]) - Progression Note    Patient Details  Name: Kaylee Robinson MRN: [id number] Date of Birth: Dec 14, 1945  [facility]) CM/SW Contact  Marlowe Sax, RN Phone Number: 12/11/2022, 3:56 PM  Clinical Narrative:     Reached out to [place] DSS  [email or web address]  Asking for an update on the plan for the patient, WOM has been accepted in the chart. Asked for a call or email to speak about the plan  Expected Discharge Plan: Skilled Nursing Facility Barriers to Discharge: Continued Medical Work up  Expected Discharge Plan and Services       Living arrangements for the past 2 months:  (Memory Care)                                       Social Determinants of Health (SDOH) Interventions SDOH Screenings   Food Insecurity: No Food Insecurity (06/12/2022)   Received from [facility], [facility]  Transportation Needs: No Transportation Needs (06/12/2022)   Received from [facility], [facility]  Utilities: Not At Risk (06/12/2022)   Received from [facility], [facility]  Financial Resource Strain: Low Risk  (06/12/2022)   Received from [facility], [facility]  Physical Activity: Insufficiently Active (06/12/2022)   Received from [facility], [facility]  Social Connections: Moderately Integrated (06/12/2022)   Received from [facility], [facility]  Stress: Patient Unable To Answer (06/12/2022)   Received from [facility], [facility]  Tobacco Use: High Risk (12/07/2022)    Readmission Risk Interventions     No data to display

## 2022-12-11 NOTE — Discharge Instructions (Signed)
Diet: As you were doing prior to hospitalization   Shower:  May shower but keep the wounds dry, use an occlusive plastic wrap, NO SOAKING IN TUB.  If the bandage gets wet, change with a clean dry gauze.  Dressing:  You may change your dressing as needed. Change the dressing with sterile gauze dressing.    Activity:  Increase activity slowly as tolerated, but follow the weight bearing instructions below.  No lifting or driving for 6 weeks.  Weight Bearing:   Weight bearing as tolerated to right lower extremity  To prevent constipation: you may use a stool softener such as -  Colace (over the counter) 100 mg by mouth twice a day  Drink plenty of fluids (prune juice may be helpful) and high fiber foods Miralax (over the counter) for constipation as needed.    Itching:  If you experience itching with your medications, try taking only a single pain pill, or even half a pain pill at a time.  You may take up to 10 pain pills per day, and you can also use benadryl over the counter for itching or also to help with sleep.   Precautions:  If you experience chest pain or shortness of breath - call 911 immediately for transfer to the hospital emergency department!!  If you develop a fever greater that 101 F, purulent drainage from wound, increased redness or drainage from wound, or calf pain-Call [facility]                                              Follow- Up Appointment:  Please call for an appointment to be seen in 2 weeks at [facility]

## 2022-12-11 NOTE — Progress Notes (Signed)
PROGRESS NOTE    Kaylee Robinson  ZOX:[id number] DOB: 03/18/1946 DOA: 12/07/2022 PCP: [facility]  Assessment & Plan:   Principal Problem:   Closed right hip fracture (HCC) Active Problems:   Dementia with behavioral disturbance (HCC)   Anxiety and depression   Protein-calorie malnutrition, severe  Assessment and Plan: Closed right hip fracture: s/p right hip bipolar hemiarthroplasty on 12/07/22. Norco, morphine prn for pain. PT/OT recs SNF. Ortho surg following and recs apprec  Acute blood loss anemia: likely secondary to recent above surg. Will transfuse if Hb < 7.0    Dementia: w/ behavior disturbance. Continue on namenda, aricept, haldol, zyprexa    Depression: severity unknown. Continue on home dose of trazodone, zoloft, depakote, buspar,           DVT prophylaxis: SCDs Code Status: full  Family Communication:  Disposition Plan: PT/OT recs SNF   Level of care: Med-Surg Status is: Inpatient Remains inpatient appropriate because: severity of illness    Consultants:  Ortho surg   Procedures:   Antimicrobials:  Subjective: Pt is confused still   Objective: Vitals:   12/10/22 0746 12/10/22 1545 12/11/22 0040 12/11/22 0805  BP: 127/66 (!) 148/65 (!) 127/52 128/67  Pulse: 64 71 (!) 59 (!) 59  Resp: 17 18 16 17   Temp: 98.1 F (36.7 C) 97.9 F (36.6 C) 98.4 F (36.9 C) (!) 97.4 F (36.3 C)  TempSrc:      SpO2: 99% 99% 99% 99%  Weight:      Height:        Intake/Output Summary (Last 24 hours) at 12/11/2022 8119 Last data filed at 12/11/2022 0500 Gross per 24 hour  Intake 120 ml  Output 1850 ml  Net -1730 ml   Filed Weights   12/06/22 2238  Weight: 48.1 kg    Examination:  General exam: Pt is confused  Respiratory system: clear breath sounds b/l Cardiovascular system: S1/S2+. No rubs or clicks  Gastrointestinal system: abd is soft, NT, ND & hypoactive bowel sounds  Central nervous system: alert & awake. Oriented to self only   Psychiatry: Judgement and insight appears poor     Data Reviewed: I have personally reviewed following labs and imaging studies  CBC: Recent Labs  Lab 12/07/22 0030 12/07/22 0310 12/08/22 0505 12/09/22 0404 12/10/22 0348 12/11/22 0637  WBC 13.8* 12.4* 6.9 7.9 8.1 7.1  NEUTROABS 11.6*  --   --   --   --   --   HGB 11.7* 11.7* 9.7* 8.6* 9.3* 9.5*  HCT 36.7 36.4 29.5* 26.1* 28.4* 28.9*  MCV 92.2 91.2 90.5 89.1 91.0 90.0  PLT 258 265 226 218 256 318   Basic Metabolic Panel: Recent Labs  Lab 12/07/22 0030 12/07/22 0310 12/08/22 0505 12/09/22 0404 12/10/22 0348  NA 137 137 135 132* 136  K 4.3 4.2 4.9 4.4 5.0  CL 103 103 102 101 103  CO2 25 24 25 23 26   GLUCOSE 116* 122* 123* 108* 116*  BUN 24* 23 26* 44* 42*  CREATININE 0.67 0.65 0.72 0.86 0.79  CALCIUM 8.1* 8.6* 8.1* 7.8* 8.1*   GFR: Estimated Creatinine Clearance: 45.4 mL/min (by C-G formula based on SCr of 0.79 mg/dL). Liver Function Tests: No results for input(s): "AST", "ALT", "ALKPHOS", "BILITOT", "PROT", "ALBUMIN" in the last 168 hours. No results for input(s): "LIPASE", "AMYLASE" in the last 168 hours. No results for input(s): "AMMONIA" in the last 168 hours. Coagulation Profile: Recent Labs  Lab 12/07/22 0030  INR 1.1  Cardiac Enzymes: No results for input(s): "CKTOTAL", "CKMB", "CKMBINDEX", "TROPONINI" in the last 168 hours. BNP (last 3 results) No results for input(s): "PROBNP" in the last 8760 hours. HbA1C: No results for input(s): "HGBA1C" in the last 72 hours. CBG: No results for input(s): "GLUCAP" in the last 168 hours. Lipid Profile: No results for input(s): "CHOL", "HDL", "LDLCALC", "TRIG", "CHOLHDL", "LDLDIRECT" in the last 72 hours. Thyroid Function Tests: No results for input(s): "TSH", "T4TOTAL", "FREET4", "T3FREE", "THYROIDAB" in the last 72 hours. Anemia Panel: No results for input(s): "VITAMINB12", "FOLATE", "FERRITIN", "TIBC", "IRON", "RETICCTPCT" in the last 72 hours. Sepsis  Labs: No results for input(s): "PROCALCITON", "LATICACIDVEN" in the last 168 hours.  No results found for this or any previous visit (from the past 240 hour(s)).       Radiology Studies: No results found.      Scheduled Meds:  docusate sodium  100 mg Oral BID   enoxaparin (LOVENOX) injection  40 mg Subcutaneous Q24H   feeding supplement  237 mL Oral TID BM   ferrous sulfate  325 mg Oral TID WC   memantine  5 mg Oral BID   multivitamin with minerals  1 tablet Oral Daily   Continuous Infusions:     LOS: 4 days     Charise Killian, MD [facility] Pager [phone]  If 7PM-7AM, please contact night-coverage [email or web address] 12/11/2022, 8:22 AM

## 2022-12-11 NOTE — Care Management Important Message (Signed)
Important Message  Patient Details  Name: Kaylee Robinson MRN: [id number] Date of Birth: [birth year]   Medicare Important Message Given:  Yes  I reviewed the form with Junious Silk at [facility] DSS and securely emailed a copy to [email or web address] and asked her to sign/date/time the initial Important Message from Medicare. Once I receive the signed copy back, I will forward to Health Information Management to be scanned into her medical record. I thanked her for time and provided my email address.     Olegario Messier A Kia Stavros 12/11/2022, 2:22 PM

## 2022-12-11 NOTE — Progress Notes (Signed)
Physical Therapy Treatment Patient Details Name: Kaylee Robinson MRN: [id number] DOB: January 16, 1946 Today's Date: 12/11/2022   History of Present Illness Patient is a 77 y.o. Caucasian female with medical history significant for dementia (Alzheimer's) and hypertension, who presented to the emergency room with acute onset of fall from her wheelchair with subsequent right hip pain. She is now s/p right hip bipolar hemiarthroplasty.    PT Comments  Patient alert, oriented to self, but anxious and fearful of falling. Able to follow one step commands, occasionally with repetition and pt was re-directable. Ultimately still required modA for bed mobility, sit <> stand with modA and RW stabilization and step by step cues for technique. Second attempt pt able to step pivot to recliner with modA including RW management. Further mobility deferred due to pt anxiety and BLE weakness. Up in chair with needs in reach at end of session. The patient would benefit from further skilled PT intervention to continue to progress towards goals.    If plan is discharge home, recommend the following: A lot of help with bathing/dressing/bathroom;Assistance with feeding;Direct supervision/assist for medications management;Direct supervision/assist for financial management;Assist for transportation;Help with stairs or ramp for entrance;Assistance with cooking/housework;Two people to help with walking and/or transfers   Can travel by private vehicle     No  Equipment Recommendations  Other (comment) (defer to next venue of care)    Recommendations for Other Services       Precautions / Restrictions Precautions Precautions: Posterior Hip Precaution Comments: pt unable to participate in posterior hip precaution education and no family/caregivers present to educate Restrictions Weight Bearing Restrictions: Yes RLE Weight Bearing: Weight bearing as tolerated     Mobility  Bed Mobility Overal bed mobility: Needs  Assistance Bed Mobility: Supine to Sit     Supine to sit: Mod assist, HOB elevated     General bed mobility comments: assistance needed for facilitation as well as physical assistance to complete    Transfers Overall transfer level: Needs assistance Equipment used: Rolling walker (2 wheels) Transfers: Sit to/from Stand, Bed to chair/wheelchair/BSC Sit to Stand: Mod assist, From elevated surface   Step pivot transfers: Mod assist       General transfer comment: pt fearful of falling but able to participate in taking lateral steps to recliner, and posteriorly to assist with improving standing balance    Ambulation/Gait               General Gait Details: deferred   Stairs             Wheelchair Mobility     Tilt Bed    Modified Rankin (Stroke Patients Only)       Balance Overall balance assessment: Needs assistance Sitting-balance support: Feet supported, Single extremity supported, Bilateral upper extremity supported, No upper extremity supported Sitting balance-Leahy Scale: Poor Sitting balance - Comments: improved to sitting with CGA but initially maxA Postural control: Left lateral lean Standing balance support: Reliant on assistive device for balance, During functional activity, Bilateral upper extremity supported Standing balance-Leahy Scale: Poor                              Cognition Arousal: Alert Behavior During Therapy: Anxious Overall Cognitive Status: History of cognitive impairments - at baseline  Exercises      General Comments        Pertinent Vitals/Pain Pain Assessment Pain Assessment: Faces Faces Pain Scale: Hurts a little bit    Home Living                          Prior Function            PT Goals (current goals can now be found in the care plan section) Progress towards PT goals: Progressing toward goals    Frequency    Min  1X/week      PT Plan      Co-evaluation              AM-PAC PT "6 Clicks" Mobility   Outcome Measure  Help needed turning from your back to your side while in a flat bed without using bedrails?: A Little Help needed moving from lying on your back to sitting on the side of a flat bed without using bedrails?: A Lot Help needed moving to and from a bed to a chair (including a wheelchair)?: A Lot Help needed standing up from a chair using your arms (e.g., wheelchair or bedside chair)?: A Lot Help needed to walk in hospital room?: A Lot Help needed climbing 3-5 steps with a railing? : Total 6 Click Score: 12    End of Session Equipment Utilized During Treatment: Gait belt Activity Tolerance: Patient tolerated treatment well Patient left: in chair;with call bell/phone within reach;with chair alarm set Nurse Communication: Mobility status PT Visit Diagnosis: Other abnormalities of gait and mobility (R26.89);Pain Pain - Right/Left: Right Pain - part of body: Hip     Time: 1914-7829 PT Time Calculation (min) (ACUTE ONLY): 16 min  Charges:    $Therapeutic Activity: 8-22 mins PT General Charges $$ ACUTE PT VISIT: 1 Visit                     Olga Coaster PT, DPT 10:55 AM,12/11/22

## 2022-12-11 NOTE — Plan of Care (Signed)

## 2022-12-12 DIAGNOSIS — S72001A Fracture of unspecified part of neck of right femur, initial encounter for closed fracture: Secondary | ICD-10-CM | POA: Diagnosis not present

## 2022-12-12 DIAGNOSIS — F03918 Unspecified dementia, unspecified severity, with other behavioral disturbance: Secondary | ICD-10-CM | POA: Diagnosis not present

## 2022-12-12 LAB — BASIC METABOLIC PANEL
Anion gap: 10 (ref 5–15)
BUN: 42 mg/dL — ABNORMAL HIGH (ref 8–23)
CO2: 23 mmol/L (ref 22–32)
Calcium: 8.4 mg/dL — ABNORMAL LOW (ref 8.9–10.3)
Chloride: 102 mmol/L (ref 98–111)
Creatinine, Ser: 0.75 mg/dL (ref 0.44–1.00)
GFR, Estimated: >60 mL/min (ref >60)
Glucose, Bld: 101 mg/dL — ABNORMAL HIGH (ref 70–99)
Potassium: 4.6 mmol/L (ref 3.5–5.1)
Sodium: 135 mmol/L (ref 135–145)

## 2022-12-12 LAB — CBC
HCT: 29.1 % — ABNORMAL LOW (ref 36.0–46.0)
Hemoglobin: 9.7 g/dL — ABNORMAL LOW (ref 12.0–15.0)
MCH: 30 pg (ref 26.0–34.0)
MCHC: 33.3 g/dL (ref 30.0–36.0)
MCV: 90.1 fL (ref 80.0–100.0)
Platelets: 326 10*3/uL (ref 150–400)
RBC: 3.23 MIL/uL — ABNORMAL LOW (ref 3.87–5.11)
RDW: 12.6 % (ref 11.5–15.5)
WBC: 9.1 10*3/uL (ref 4.0–10.5)
nRBC: 0 % (ref 0.0–0.2)

## 2022-12-12 MED ORDER — NICOTINE 7 MG/24HR TD PT24
7.0000 mg | MEDICATED_PATCH | Freq: Every day | TRANSDERMAL | Status: DC
  Administered 2022-12-12 – 2022-12-13 (×2): 7 mg via TRANSDERMAL
  Filled 2022-12-12 (×2): qty 1

## 2022-12-12 NOTE — TOC Progression Note (Signed)
Transition of Care [facility]) - Progression Note    Patient Details  Name: Kaylee Robinson MRN: [id number] Date of Birth: [birth year]  [facility]) CM/SW Contact  Marlowe Sax, RN Phone Number: 12/12/2022, 11:13 AM  Clinical Narrative:    Sent a second secure email to crystal at DSS asking for a call back to formulate a DC plan for this patient Awaiting a response   Expected Discharge Plan: Skilled Nursing Facility Barriers to Discharge: Continued Medical Work up  Expected Discharge Plan and Services       Living arrangements for the past 2 months:  (Memory Care)                                       Social Determinants of Health (SDOH) Interventions SDOH Screenings   Food Insecurity: No Food Insecurity (06/12/2022)   Received from [facility], [facility]  Transportation Needs: No Transportation Needs (06/12/2022)   Received from [facility], [facility]  Utilities: Not At Risk (06/12/2022)   Received from [facility], [facility]  Financial Resource Strain: Low Risk  (06/12/2022)   Received from [facility], [facility]  Physical Activity: Insufficiently Active (06/12/2022)   Received from [facility], [facility]  Social Connections: Moderately Integrated (06/12/2022)   Received from [facility], [facility]  Stress: Patient Unable To Answer (06/12/2022)   Received from [facility], [facility]  Tobacco Use: High Risk (12/07/2022)    Readmission Risk Interventions     No data to display

## 2022-12-12 NOTE — Progress Notes (Signed)
Subjective: 5 Days Post-Op Procedure(s) (LRB): ARTHROPLASTY BIPOLAR HIP (HEMIARTHROPLASTY) (Right) Patient is more confused this morning.  Reports mild-moderate pain in the right hip. Patient is well does have dementia.   Continue with physical therapy no nausea and no vomiting Patient denies any chest pains or shortness of breath.  Objective: Vital signs in last 24 hours: Temp:  [97.4 F (36.3 C)-97.7 F (36.5 C)] 97.5 F (36.4 C) (09/23 2358) Pulse Rate:  [59-74] 64 (09/23 2358) Resp:  [16-20] 20 (09/23 2358) BP: (114-128)/(64-73) 127/64 (09/23 2358) SpO2:  [92 %-100 %] 100 % (09/23 2358)  Intake/Output from previous day: 09/23 0701 - 09/24 0700 In: -  Out: 1400 [Urine:1400] Intake/Output this shift: No intake/output data recorded.  Recent Labs    12/10/22 0348 12/11/22 0637 12/12/22 0559  HGB 9.3* 9.5* 9.7*   Recent Labs    12/11/22 0637 12/12/22 0559  WBC 7.1 9.1  RBC 3.21* 3.23*  HCT 28.9* 29.1*  PLT 318 326   Recent Labs    12/11/22 0637 12/12/22 0559  NA 137 135  K 4.7 4.6  CL 106 102  CO2 24 23  BUN 35* 42*  CREATININE 0.73 0.75  GLUCOSE 103* 101*  CALCIUM 8.5* 8.4*   No results for input(s): "LABPT", "INR" in the last 72 hours.  EXAM General - Patient is lacking this morning but is able to answer simple questions this morning. Extremity - Neurologically intact Neurovascular intact Sensation intact distally Intact pulses distally Dorsiflexion/Plantar flexion intact No cellulitis present Dressing -mild drainage to the right hip dressing. Motor Function - intact, moving foot and toes well on exam.  Able to do straight leg raise on her own  Past Medical History:  Diagnosis Date   Alzheimer disease (HCC)    Arthritis    bilateral knees   Hypertension    Vascular dementia (HCC)     Assessment/Plan: 5 Days Post-Op Procedure(s) (LRB): ARTHROPLASTY BIPOLAR HIP (HEMIARTHROPLASTY) (Right) Principal Problem:   Closed right hip fracture  (HCC) Active Problems:   Dementia with behavioral disturbance (HCC)   Anxiety and depression   Protein-calorie malnutrition, severe  Estimated body mass index is 17.11 kg/m as calculated from the following:   Height as of this encounter: 5\' 6"  (1.676 m).   Weight as of this encounter: 48.1 kg. Advance diet Up with therapy She has had a BM since surgery. Patient was asking to smoke.  Nicotine patch ordered.  Plan for discharge to SNF pending insurance approval. Lovenox 40mg  daily for 14 days following discharge for DVT prophylaxis. Follow-up with [facility] in 10-14 days for staple removal.  Weight-Bearing as tolerated to right leg  J. Horris Latino, PA-C [facility] 12/12/2022, 7:54 AM

## 2022-12-12 NOTE — Plan of Care (Signed)

## 2022-12-12 NOTE — Progress Notes (Signed)
PT Cancellation Note  Patient Details Name: Kaylee Robinson MRN: [id number] DOB: 03-Jul-1945   Cancelled Treatment:    Reason Eval/Treat Not Completed: Other (comment). Pt with RN staff at [employer], PT to re-attempt as able.    Olga Coaster PT, DPT 2:27 PM,12/12/22

## 2022-12-12 NOTE — Progress Notes (Signed)
PROGRESS NOTE   HPI was taken from Dr. Arville Care: Kaylee Robinson is a 77 y.o. Caucasian female with medical history significant for dementia and hypertension, who presented to the emergency room with acute onset of fall from her wheelchair with subsequent right hip pain.  No reported presyncope or syncope.  No reported chest pain or palpitations.  No reported paresthesias or focal muscle weakness.  No reported fever or chills.  No reported nausea or vomiting or abdominal pain.  No reported dysuria, oliguria or hematuria or flank pain.   ED Course: When the patient came to the ER, BP was 181/76 with otherwise normal vital signs.  Labs revealed a BUN of 24 with a calcium of 8.1 and CBC showing leukocytosis of 13.8.  Valproic acid level was 29 EKG as reviewed by me : EKG showed normal sinus rhythm with a rate of 72 short PR, prolonged QT interval with QTc of 473 MS with T wave inversion anterolaterally. Imaging: Portable chest x-ray showed no acute cardiopulmonary disease.  Right elbow x-ray showed no acute abnormality.  Right wrist x-ray showed healing distal fracture of the radius with no acute abnormality.   The patient was given 50 mcg of IV fentanyl, 4 mg of IV Zofran and Tdap booster.  She will be admitted to a medical bed for further evaluation and management.  As per Dr. Mayford Knife 9/19-9/24/24: Pt presented w/ right hip fracture s/p repair. PT/OT recs SNF. Will be a difficult SNF placement. CM is working on this. Pt has severe dementia w/ behavior disturbance.   Karen Kays  UYQ:[id number] DOB: 05-Jul-1945 DOA: 12/07/2022 PCP: [facility]  Assessment & Plan:   Principal Problem:   Closed right hip fracture (HCC) Active Problems:   Dementia with behavioral disturbance (HCC)   Anxiety and depression   Protein-calorie malnutrition, severe  Assessment and Plan: Closed right hip fracture: s/p right hip bipolar hemiarthroplasty on 12/07/22. Norco, morphine prn for pain. PT/OT recs  SNF. Ortho surg following and recs apprec  Acute blood loss anemia: likely secondary to recent above surg. No need for a transfusion currently    Dementia: w/ behavior disturbance. Severe. Continue on zyprexa, aricept, namenda, haldol    Depression: severity unknown. Continue on depakote, trazodone, buspar, zoloft           DVT prophylaxis: lovenox Code Status: full  Family Communication:  Disposition Plan: PT/OT recs SNF   Level of care: Med-Surg Status is: Inpatient Remains inpatient appropriate because: medically stable. Needs SNF placement. Will be a difficult placement. CM is working on this     Consultants:  Ortho surg   Procedures:   Antimicrobials:  Subjective: Pt is confused   Objective: Vitals:   12/11/22 0805 12/11/22 1617 12/11/22 2358 12/12/22 0825  BP: 128/67 114/73 127/64 (!) 127/59  Pulse: (!) 59 74 64 67  Resp: 17 16 20 16   Temp: (!) 97.4 F (36.3 C) 97.7 F (36.5 C) (!) 97.5 F (36.4 C) 98.2 F (36.8 C)  TempSrc:      SpO2: 99% 92% 100% 99%  Weight:      Height:        Intake/Output Summary (Last 24 hours) at 12/12/2022 0852 Last data filed at 12/12/2022 0538 Gross per 24 hour  Intake --  Output 1400 ml  Net -1400 ml   Filed Weights   12/06/22 2238  Weight: 48.1 kg    Examination:  General exam: Pt is confused  Respiratory system: clear breath sounds b/l Cardiovascular system:  S1 & S2+. No rubs or clicks  Gastrointestinal system: abd is soft, NT, ND & hypoactive bowel sounds  Central nervous system: alert & awake. Moves all extremities  Psychiatry: Judgement and insight appears poor     Data Reviewed: I have personally reviewed following labs and imaging studies  CBC: Recent Labs  Lab 12/07/22 0030 12/07/22 0310 12/08/22 0505 12/09/22 0404 12/10/22 0348 12/11/22 0637 12/12/22 0559  WBC 13.8*   < > 6.9 7.9 8.1 7.1 9.1  NEUTROABS 11.6*  --   --   --   --   --   --   HGB 11.7*   < > 9.7* 8.6* 9.3* 9.5* 9.7*  HCT  36.7   < > 29.5* 26.1* 28.4* 28.9* 29.1*  MCV 92.2   < > 90.5 89.1 91.0 90.0 90.1  PLT 258   < > 226 218 256 318 326   < > = values in this interval not displayed.   Basic Metabolic Panel: Recent Labs  Lab 12/08/22 0505 12/09/22 0404 12/10/22 0348 12/11/22 0637 12/12/22 0559  NA 135 132* 136 137 135  K 4.9 4.4 5.0 4.7 4.6  CL 102 101 103 106 102  CO2 25 23 26 24 23   GLUCOSE 123* 108* 116* 103* 101*  BUN 26* 44* 42* 35* 42*  CREATININE 0.72 0.86 0.79 0.73 0.75  CALCIUM 8.1* 7.8* 8.1* 8.5* 8.4*   GFR: Estimated Creatinine Clearance: 45.4 mL/min (by C-G formula based on SCr of 0.75 mg/dL). Liver Function Tests: No results for input(s): "AST", "ALT", "ALKPHOS", "BILITOT", "PROT", "ALBUMIN" in the last 168 hours. No results for input(s): "LIPASE", "AMYLASE" in the last 168 hours. No results for input(s): "AMMONIA" in the last 168 hours. Coagulation Profile: Recent Labs  Lab 12/07/22 0030  INR 1.1   Cardiac Enzymes: No results for input(s): "CKTOTAL", "CKMB", "CKMBINDEX", "TROPONINI" in the last 168 hours. BNP (last 3 results) No results for input(s): "PROBNP" in the last 8760 hours. HbA1C: No results for input(s): "HGBA1C" in the last 72 hours. CBG: No results for input(s): "GLUCAP" in the last 168 hours. Lipid Profile: No results for input(s): "CHOL", "HDL", "LDLCALC", "TRIG", "CHOLHDL", "LDLDIRECT" in the last 72 hours. Thyroid Function Tests: No results for input(s): "TSH", "T4TOTAL", "FREET4", "T3FREE", "THYROIDAB" in the last 72 hours. Anemia Panel: No results for input(s): "VITAMINB12", "FOLATE", "FERRITIN", "TIBC", "IRON", "RETICCTPCT" in the last 72 hours. Sepsis Labs: No results for input(s): "PROCALCITON", "LATICACIDVEN" in the last 168 hours.  No results found for this or any previous visit (from the past 240 hour(s)).       Radiology Studies: No results found.      Scheduled Meds:  docusate sodium  100 mg Oral BID   enoxaparin (LOVENOX)  injection  40 mg Subcutaneous Q24H   feeding supplement  237 mL Oral TID BM   ferrous sulfate  325 mg Oral TID WC   memantine  5 mg Oral BID   multivitamin with minerals  1 tablet Oral Daily   nicotine  7 mg Transdermal Daily   Continuous Infusions:     LOS: 5 days     Charise Killian, MD [facility] Pager [phone]  If 7PM-7AM, please contact night-coverage [email or web address] 12/12/2022, 8:52 AM

## 2022-12-13 DIAGNOSIS — S72001A Fracture of unspecified part of neck of right femur, initial encounter for closed fracture: Secondary | ICD-10-CM | POA: Diagnosis not present

## 2022-12-13 MED ORDER — NICOTINE 7 MG/24HR TD PT24: 7.0000 mg | MEDICATED_PATCH | Freq: Every day | TRANSDERMAL | Status: DC

## 2022-12-13 MED ORDER — ADULT MULTIVITAMIN W/MINERALS CH: 1.0000 | ORAL_TABLET | Freq: Every day | ORAL | Status: DC

## 2022-12-13 MED ORDER — ENSURE ENLIVE PO LIQD: 237.0000 mL | Freq: Three times a day (TID) | ORAL | Status: DC

## 2022-12-13 MED ORDER — QUETIAPINE FUMARATE 50 MG PO TABS: 50.0000 mg | ORAL_TABLET | Freq: Every evening | ORAL | Status: DC | PRN

## 2022-12-13 NOTE — Plan of Care (Signed)
  Problem: Clinical Measurements: Goal: Will remain free from infection Outcome: Progressing   Problem: Skin Integrity: Goal: Risk for impaired skin integrity will decrease Outcome: Progressing   Problem: Education: Goal: Knowledge of General Education information will improve Description: Including pain rating scale, medication(s)/side effects and non-pharmacologic comfort measures Outcome: Not Progressing   Problem: Health Behavior/Discharge Planning: Goal: Ability to manage health-related needs will improve Outcome: Not Progressing   Problem: Coping: Goal: Level of anxiety will decrease Outcome: Not Progressing

## 2022-12-13 NOTE — TOC Progression Note (Signed)
Transition of Care [facility]) - Progression Note    Patient Details  Name: Kaylee Robinson MRN: [id number] Date of Birth: Dec 11, 1945  Transition of [facility]) CM/SW Contact  Marlowe Sax, RN Phone Number: 12/13/2022, 11:11 AM  Clinical Narrative:    Ins approved [id number] 9/25-9/27 to go to [facility] [employer]   Expected Discharge Plan: Skilled Nursing Facility Barriers to Discharge: Continued Medical Work up  Expected Discharge Plan and Services       Living arrangements for the past 2 months:  (Memory Care)                                       Social Determinants of Health (SDOH) Interventions SDOH Screenings   Food Insecurity: No Food Insecurity (06/12/2022)   Received from [facility], [facility]  Transportation Needs: No Transportation Needs (06/12/2022)   Received from [facility], [facility]  Utilities: Not At Risk (06/12/2022)   Received from [facility], [facility]  Financial Resource Strain: Low Risk  (06/12/2022)   Received from [facility], [facility]  Physical Activity: Insufficiently Active (06/12/2022)   Received from [facility], [facility]  Social Connections: Moderately Integrated (06/12/2022)   Received from [facility], [facility]  Stress: Patient Unable To Answer (06/12/2022)   Received from [facility], [facility]  Tobacco Use: High Risk (12/07/2022)    Readmission Risk Interventions    12/13/2022   10:11 AM  Readmission Risk Prevention Plan  Transportation Screening Complete  PCP or Specialist Appt within 3-5 Days Complete  HRI or Home Care Consult Complete  Social Work Consult for Recovery Care Planning/Counseling Complete  Palliative Care Screening Not Applicable  Medication Review [employer]) Referral to Pharmacy

## 2022-12-13 NOTE — TOC Progression Note (Signed)
Transition of Care [facility]) - Progression Note    Patient Details  Name: Kaylee Robinson MRN: [id number] Date of Birth: 19-Aug-1945  Transition of Care [facility]) CM/SW Contact  Marlowe Sax, RN Phone Number: 12/13/2022, 1:52 PM  Clinical Narrative:    Called EMS to arrange transportation Going to [facility] room 300P Emailed crystal  at [employer] to notify of the room number and DC    Expected Discharge Plan: Skilled Nursing Facility Barriers to Discharge: Continued Medical Work up  Expected Discharge Plan and Services       Living arrangements for the past 2 months:  (Memory Care) Expected Discharge Date: 12/13/22                                     Social Determinants of Health (SDOH) Interventions SDOH Screenings   Food Insecurity: No Food Insecurity (06/12/2022)   Received from [facility], [facility]  Transportation Needs: No Transportation Needs (06/12/2022)   Received from [facility], [facility]  Utilities: Not At Risk (06/12/2022)   Received from [facility], [facility]  Financial Resource Strain: Low Risk  (06/12/2022)   Received from [facility], [facility]  Physical Activity: Insufficiently Active (06/12/2022)   Received from [facility], [facility]  Social Connections: Moderately Integrated (06/12/2022)   Received from [facility], [facility]  Stress: Patient Unable To Answer (06/12/2022)   Received from [facility], [facility]  Tobacco Use: High Risk (12/07/2022)    Readmission Risk Interventions    12/13/2022   10:11 AM  Readmission Risk Prevention Plan  Transportation Screening Complete  PCP or Specialist Appt within 3-5 Days Complete  HRI or Home Care Consult Complete  Social Work Consult for Recovery Care Planning/Counseling Complete  Palliative Care Screening Not Applicable  Medication Review [facility]) Referral to Pharmacy

## 2022-12-13 NOTE — TOC Progression Note (Addendum)
Transition of Care [facility]) - Progression Note    Patient Details  Name: Kaylee Robinson MRN: [id number] Date of Birth: 05-19-1945  Transition of Care [facility]) CM/SW Contact  Marlowe Sax, RN Phone Number: 12/13/2022, 10:02 AM  Clinical Narrative:    Received a call from Junious Silk at [facility], I reviewe the bed offers with her and she notified me that they prefer [facility], I accepted the bed offer in the Hub and notified Debra at [facility] Ins auth pending ref number [id number] Crystal at [facility] would like an email when the patient discharges   Expected Discharge Plan: Skilled Nursing Facility Barriers to Discharge: Continued Medical Work up  Expected Discharge Plan and Services       Living arrangements for the past 2 months:  (Memory Care)                                       Social Determinants of Health (SDOH) Interventions SDOH Screenings   Food Insecurity: No Food Insecurity (06/12/2022)   Received from [facility], [facility]  Transportation Needs: No Transportation Needs (06/12/2022)   Received from [facility], [facility]  Utilities: Not At Risk (06/12/2022)   Received from [facility], [facility]  Financial Resource Strain: Low Risk  (06/12/2022)   Received from [facility], [facility]  Physical Activity: Insufficiently Active (06/12/2022)   Received from [facility], [facility]  Social Connections: Moderately Integrated (06/12/2022)   Received from [facility], [facility]  Stress: Patient Unable To Answer (06/12/2022)   Received from [facility], [facility]  Tobacco Use: High Risk (12/07/2022)    Readmission Risk Interventions     No data to display

## 2022-12-13 NOTE — Progress Notes (Signed)
Subjective: 6 Days Post-Op Procedure(s) (LRB): ARTHROPLASTY BIPOLAR HIP (HEMIARTHROPLASTY) (Right) Patient is more confused this morning.  Reports mild pain in the right hip. Patient is well does have dementia.  Calmer this morning compared to yesterday. Continue with physical therapy no nausea and no vomiting Patient denies any chest pains or shortness of breath. She has had a BM following surgery.  Objective: Vital signs in last 24 hours: Temp:  [97.9 F (36.6 C)-98.3 F (36.8 C)] 97.9 F (36.6 C) (09/24 2327) Pulse Rate:  [66-68] 68 (09/24 2327) Resp:  [16-17] 16 (09/24 2327) BP: (118-127)/(59-68) 118/63 (09/24 2327) SpO2:  [95 %-99 %] 97 % (09/24 2327)  Intake/Output from previous day: 09/24 0701 - 09/25 0700 In: 120 [P.O.:120] Out: 0  Intake/Output this shift: No intake/output data recorded.  Recent Labs    12/11/22 0637 12/12/22 0559  HGB 9.5* 9.7*   Recent Labs    12/11/22 0637 12/12/22 0559  WBC 7.1 9.1  RBC 3.21* 3.23*  HCT 28.9* 29.1*  PLT 318 326   Recent Labs    12/11/22 0637 12/12/22 0559  NA 137 135  K 4.7 4.6  CL 106 102  CO2 24 23  BUN 35* 42*  CREATININE 0.73 0.75  GLUCOSE 103* 101*  CALCIUM 8.5* 8.4*   No results for input(s): "LABPT", "INR" in the last 72 hours.  EXAM General - Patient is lacking this morning but is able to answer simple questions this morning. Extremity - Neurologically intact Neurovascular intact Sensation intact distally Intact pulses distally Dorsiflexion/Plantar flexion intact No cellulitis present Dressing -mild drainage to the right hip dressing. Motor Function - intact, moving foot and toes well on exam.  Able to do straight leg raise on her own  Past Medical History:  Diagnosis Date   Alzheimer disease (HCC)    Arthritis    bilateral knees   Hypertension    Vascular dementia (HCC)     Assessment/Plan: 6 Days Post-Op Procedure(s) (LRB): ARTHROPLASTY BIPOLAR HIP (HEMIARTHROPLASTY)  (Right) Principal Problem:   Closed right hip fracture (HCC) Active Problems:   Dementia with behavioral disturbance (HCC)   Anxiety and depression   Protein-calorie malnutrition, severe  Estimated body mass index is 17.11 kg/m as calculated from the following:   Height as of this encounter: 5\' 6"  (1.676 m).   Weight as of this encounter: 48.1 kg. Advance diet Up with therapy She has had a BM since surgery. Labs reviewed yesterday, Hg 9.7 yesterday.  Plan for discharge to SNF pending insurance approval. Lovenox 40mg  daily for 14 days following discharge for DVT prophylaxis. Staples can be removed by SNF on 12/21/22.  Follow-up with [facility] in 6 weeks for x-rays of the right hip.  Ortho will sign off at this time, please re-consult with any questions or if new issues occur.  Weight-Bearing as tolerated to right leg  J. Horris Latino, PA-C [facility] 12/13/2022, 7:32 AM

## 2022-12-13 NOTE — Discharge Summary (Addendum)
Physician Discharge Summary   Kaylee Robinson  female DOB: 12-07-1945  ZOX:[id number]  PCP: [facility]  Admit date: 12/07/2022 Discharge date: 12/13/2022  Admitted From: ALF Disposition:  SNF rehab CODE STATUS: Full code  Discharge Instructions     Discharge instructions   Complete by: As directed    Staples can be removed by SNF on 12/21/22.  Follow-up with [facility] in 6 weeks for x-rays of the right hip. [facility] Course:  For full details, please see H&P, progress notes, consult notes and ancillary notes.  Briefly,  Kaylee Robinson is a 77 y.o. Caucasian female with medical history significant for dementia and hypertension, who presented to the emergency room with acute onset of fall from her wheelchair with subsequent right hip pain.   Closed right hip fracture:  s/p right hip bipolar hemiarthroplasty on 12/07/22.  --Norco prn for pain.  --Lovenox 40mg  daily for 14 days following discharge for DVT prophylaxis. --Staples can be removed by SNF on 12/21/22.  Follow-up with [facility] in 6 weeks for x-rays of the right hip.   Acute blood loss anemia:  likely secondary to recent above surg.  Did not need transfusion.  Hgb stable around 9's prior to discharge.   Dementia:  w/ behavior disturbance. Severe.  --not taking Zyprexa and donepezil PTA. --cont home Namenda, haldol PRN --started on seroquel 50 mg nightly PRN   Depression: severity unknown.  Continue on home depakote, trazodone, buspar, zoloft    Unless noted above, medications under "STOP" list are ones pt was not taking PTA.  Discharge Diagnoses:  Principal Problem:   Closed right hip fracture (HCC) Active Problems:   Dementia with behavioral disturbance (HCC)   Anxiety and depression   Protein-calorie malnutrition, severe   30 Day Unplanned Readmission Risk Score    Flowsheet Row ED to Hosp-Admission (Current) from 12/07/2022 in [facility] (1A)  30 Day Unplanned Readmission Risk Score (%) 27.8 Filed at 12/13/2022 0801       This score is the patient's risk of an unplanned readmission within 30 days of being discharged (0 -100%). The score is based on dignosis, age, lab data, medications, orders, and past utilization.   Low:  0-14.9   Medium: 15-21.9   High: 22-29.9   Extreme: 30 and above         Discharge Instructions:  Allergies as of 12/13/2022       Reactions   Enalapril Other (See Comments)   Naproxen Swelling   Amoxicillin Rash   Sulfa Antibiotics Rash        Medication List     STOP taking these medications    albuterol 108 (90 Base) MCG/ACT inhaler Commonly known as: VENTOLIN HFA   bisacodyl 10 MG suppository Commonly known as: DULCOLAX   cetirizine 10 MG tablet Commonly known as: ZYRTEC   diclofenac Sodium 1 % Gel Commonly known as: VOLTAREN   donepezil 5 MG tablet Commonly known as: ARICEPT   doxycycline 100 MG tablet Commonly known as: VIBRA-TABS   fluticasone 50 MCG/ACT nasal spray Commonly known as: FLONASE   LORazepam 0.5 MG tablet Commonly known as: ATIVAN   methocarbamol 500 MG tablet Commonly known as: ROBAXIN   OLANZapine 2.5 MG tablet Commonly known as: ZYPREXA       TAKE these medications    acetaminophen 325 MG tablet Commonly known as: TYLENOL Take 650 mg by mouth 3 (three) times daily.  alum & mag hydroxide-simeth 200-200-20 MG/5ML suspension Commonly known as: MAALOX/MYLANTA Take 30 mLs by mouth every 4 (four) hours as needed for indigestion.   B-12 1000 MCG Caps Take 1 capsule by mouth daily.   busPIRone 5 MG tablet Commonly known as: BUSPAR Take 5 mg by mouth 2 (two) times daily.   divalproex 250 MG DR tablet Commonly known as: DEPAKOTE Take 250 mg by mouth 2 (two) times daily.   docusate sodium 100 MG capsule Commonly known as: COLACE Take 1 capsule (100 mg total) by mouth 2 (two) times daily.   enoxaparin 40 MG/0.4ML  injection Commonly known as: LOVENOX Inject 0.4 mLs (40 mg total) into the skin daily.   feeding supplement Liqd Take 237 mLs by mouth 3 (three) times daily between meals.   ferrous sulfate 325 (65 FE) MG tablet Take 1 tablet (325 mg total) by mouth 3 (three) times daily after meals.   haloperidol 2 MG tablet Commonly known as: HALDOL Take 2 mg by mouth every 8 (eight) hours as needed.   HYDROcodone-acetaminophen 5-325 MG tablet Commonly known as: NORCO/VICODIN Take 1 tablet by mouth every 6 (six) hours as needed for moderate pain. What changed:  when to take this reasons to take this Another medication with the same name was removed. Continue taking this medication, and follow the directions you see here.   melatonin 5 MG Tabs Take 1 tablet (5 mg total) by mouth at bedtime as needed (sleep).   memantine 10 MG tablet Commonly known as: NAMENDA Take 10 mg by mouth 2 (two) times daily. What changed: Another medication with the same name was removed. Continue taking this medication, and follow the directions you see here.   multivitamin with minerals Tabs tablet Take 1 tablet by mouth daily. Start taking on: December 14, 2022   nicotine 7 mg/24hr patch Commonly known as: NICODERM CQ - dosed in mg/24 hr Place 1 patch (7 mg total) onto the skin daily. Start taking on: December 14, 2022   polyethylene glycol 17 g packet Commonly known as: MIRALAX / GLYCOLAX Take 17 g by mouth daily as needed for mild constipation.   QUEtiapine 50 MG tablet Commonly known as: SEROQUEL Take 1 tablet (50 mg total) by mouth at bedtime as needed (Agitation).   sertraline 50 MG tablet Commonly known as: ZOLOFT Take 50 mg by mouth daily. What changed: Another medication with the same name was removed. Continue taking this medication, and follow the directions you see here.   traZODone 50 MG tablet Commonly known as: DESYREL Take 50 mg by mouth at bedtime.   Vitamin D-1000 Max St 25 MCG  (1000 UT) tablet Generic drug: Cholecalciferol Take 1,000 Units by mouth daily.         Contact information for follow-up providers     Anson Oregon, PA-C Follow up in 6 week(s).   Specialty: Physician Assistant Why: X-rays of the right hip. Staple can be removed by SNF on 12/21/22. Contact information: [address] [place] [address] [phone]         [facility] Follow up.   Contact information: [address] [place] [address] [phone]              Contact information for after-discharge care     Destination     [facility] [place] .   Service: Skilled Paramedic information: [address] [place] [address] [phone]  Allergies  Allergen Reactions   Enalapril Other (See Comments)   Naproxen Swelling   Amoxicillin Rash   Sulfa Antibiotics Rash     The results of significant diagnostics from this hospitalization (including imaging, microbiology, ancillary and laboratory) are listed below for reference.   Consultations:   Procedures/Studies: DG HIP UNILAT W OR W/O PELVIS 2-3 VIEWS RIGHT  Result Date: 12/07/2022 CLINICAL DATA:  [id number] Status post hip hemiarthroplasty [id number] EXAM: DG HIP (WITH OR WITHOUT PELVIS) 2-3V RIGHT COMPARISON:  12/06/22 FINDINGS: Right hip arthroplasty in expected alignment. No periprosthetic lucency or fracture. Recent postsurgical change includes air and edema in the soft tissues. Lateral skin staples in place. Prior left femur surgery. IMPRESSION: Right hip arthroplasty without immediate postoperative complication. Electronically Signed   By: Narda Rutherford M.D.   On: 12/07/2022 17:23   DG Wrist Complete Right  Result Date: 12/07/2022 CLINICAL DATA:  Right wrist pain following fall, initial encounter EXAM: RIGHT WRIST - COMPLETE 3+ VIEW COMPARISON:  11/20/2022 FINDINGS: No acute fracture or dislocation  is noted. Sclerosis is noted in the radial metaphysis distally consistent with healing fracture. No soft tissue changes are noted. IMPRESSION: Healing distal fracture of the radius.  No acute abnormality noted. Electronically Signed   By: Alcide Clever M.D.   On: 12/07/2022 02:20   DG Elbow Complete Right  Result Date: 12/07/2022 CLINICAL DATA:  Recent fall with right elbow pain, initial encounter EXAM: RIGHT ELBOW - COMPLETE 3+ VIEW COMPARISON:  None Available. FINDINGS: There is no evidence of fracture, dislocation, or joint effusion. There is no evidence of arthropathy or other focal bone abnormality. Soft tissues are unremarkable. IMPRESSION: No acute abnormality noted. Electronically Signed   By: Alcide Clever M.D.   On: 12/07/2022 02:19   DG Chest 1 View  Result Date: 12/06/2022 CLINICAL DATA:  Fall, hip fracture. EXAM: CHEST  1 VIEW COMPARISON:  Chest radiograph 09/19/2018 FINDINGS: The heart is normal in size. Mediastinal contours are in unchanged. No focal airspace disease, large pleural effusion, or pneumothorax. Normal pulmonary vasculature. No evidence of displaced rib fracture or acute osseous finding. IMPRESSION: No acute findings. Electronically Signed   By: Narda Rutherford M.D.   On: 12/06/2022 23:28   DG HIP UNILAT WITH PELVIS 2-3 VIEWS RIGHT  Result Date: 12/06/2022 CLINICAL DATA:  Right hip pain, fall getting out of wheelchair. EXAM: DG HIP (WITH OR WITHOUT PELVIS) 2-3V RIGHT COMPARISON:  Radiograph 11/19/2020 FINDINGS: Displaced right femoral neck fracture. There is mild proximal migration of the femoral shaft. The femoral head is seated. The bones are diffusely under mineralized. The pubic rami are intact. Left proximal femur hardware is partially included. There is soft tissue edema lateral to the fracture site. IMPRESSION: Displaced right femoral neck fracture. Electronically Signed   By: Narda Rutherford M.D.   On: 12/06/2022 23:26   DG Hip Unilat With Pelvis 2-3 Views  Right  Result Date: 11/20/2022 CLINICAL DATA:  77 year old female with history of right hip pain, unable to bear weight. EXAM: DG HIP (WITH OR WITHOUT PELVIS) 2-3V RIGHT COMPARISON:  Pelvis radiograph 09/19/2018. FINDINGS: AP view of the bony pelvis and AP and lateral views of the right hip demonstrate no definite acute displaced fracture of the bony pelvic ring. Right femoral head is properly located. There is joint space narrowing, subchondral sclerosis, subchondral cyst formation and osteophyte formation in the hip joints bilaterally, indicative of osteoarthritis. Postoperative changes of ORIF are noted in the left hip where there is a healed intertrochanteric fracture  with some adjacent heterotopic ossification. IMPRESSION: 1. No acute radiographic abnormality of the bony pelvis or the right hip. 2. 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 3. Postoperative changes of ORIF in the left proximal femur. Electronically Signed   By: Trudie Reed M.D.   On: 11/20/2022 10:34   DG Wrist Complete Right  Result Date: 11/20/2022 CLINICAL DATA:  pain, unable to weight bear EXAM: RIGHT WRIST - COMPLETE 3+ VIEW COMPARISON:  06/19/2022 FINDINGS: Sclerosis across the distal radial metaphysis, new since previous, suggesting healing response. Neutral angulation of the distal radial articular surface. No acute cortical discontinuity is evident. Carpal rows intact. Diffuse osteopenia. DJD in the first [facility] articulation as before. IMPRESSION: Healing distal radial fracture. Electronically Signed   By: Corlis Leak M.D.   On: 11/20/2022 10:21   CT Cervical Spine Wo Contrast  Result Date: 11/20/2022 CLINICAL DATA:  Neck pain and headache. Subjective fever. Neck trauma. No documented follow-up. EXAM: CT CERVICAL SPINE WITHOUT CONTRAST TECHNIQUE: Multidetector CT imaging of the cervical spine was performed without intravenous contrast. Multiplanar CT image reconstructions were also generated. RADIATION  DOSE REDUCTION: This exam was performed according to the departmental dose-optimization program which includes automated exposure control, adjustment of the mA and/or kV according to patient size and/or use of iterative reconstruction technique. COMPARISON:  Cervical spine radiographs 03/20/2014. Chest CT 12/25/2011. FINDINGS: Despite efforts by the technologist and patient, mild motion artifact is present on today's exam and could not be eliminated. This reduces exam sensitivity and specificity. The motion is greatest on the images obtained through the upper thoracic spine. Alignment: Reversal of the usual cervical lordosis. No focal angulation or listhesis. Skull base and vertebrae: No evidence of acute cervical spine fracture or traumatic subluxation. There are superior endplate compression deformities within the visualized upper thoracic spine. Deformity at T1 appears unchanged from the remote cervical spine radiographs. The deformities at T2 and T3 were not previously imaged. No definite acute fractures are identified. Soft tissues and spinal canal: No prevertebral fluid or swelling. No visible canal hematoma. Disc levels: Multilevel cervical spondylosis with disc space narrowing, endplate osteophytes and bilateral facet hypertrophy. Probable interbody and left interfacetal ankylosis at C5-6. Mild osseous foraminal narrowing at multiple levels which appears greatest on the right at C7-T1. Upper chest: Mild emphysematous changes at the lung apices. Other: None. IMPRESSION: 1. No evidence of acute cervical spine fracture, traumatic subluxation or static signs of instability. 2. Superior endplate compression deformities within the visualized upper thoracic spine. The T1 compression deformity appears unchanged from remote cervical spine radiographs. The T2 and T3 compression deformities were not previously imaged. These demonstrate no definite acute features, although are suboptimally evaluated due to motion. 3.  Multilevel cervical spondylosis as described. Electronically Signed   By: Carey Bullocks M.D.   On: 11/20/2022 10:09   CT Head Wo Contrast  Result Date: 11/20/2022 CLINICAL DATA:  Headache EXAM: CT HEAD WITHOUT CONTRAST TECHNIQUE: Contiguous axial images were obtained from the base of the skull through the vertex without intravenous contrast. RADIATION DOSE REDUCTION: This exam was performed according to the departmental dose-optimization program which includes automated exposure control, adjustment of the mA and/or kV according to patient size and/or use of iterative reconstruction technique. COMPARISON:  08/23/2017 FINDINGS: Brain: No evidence of acute infarction, hemorrhage, hydrocephalus, extra-axial collection or mass lesion/mass effect. Patchy low-density changes within the periventricular and subcortical white matter most compatible with chronic microvascular ischemic change. Moderate diffuse cerebral volume loss. Vascular: Atherosclerotic calcifications involving  the large vessels of the skull base. No unexpected hyperdense vessel. Skull: Normal. Negative for fracture or focal lesion. Sinuses/Orbits: No acute finding. Other: None. IMPRESSION: 1. No acute intracranial findings. 2. Chronic microvascular ischemic change and cerebral volume loss. Electronically Signed   By: Duanne Guess D.O.   On: 11/20/2022 10:06      Labs: BNP (last 3 results) No results for input(s): "BNP" in the last 8760 hours. Basic Metabolic Panel: Recent Labs  Lab 12/08/22 0505 12/09/22 0404 12/10/22 0348 12/11/22 0637 12/12/22 0559  NA 135 132* 136 137 135  K 4.9 4.4 5.0 4.7 4.6  CL 102 101 103 106 102  CO2 25 23 26 24 23   GLUCOSE 123* 108* 116* 103* 101*  BUN 26* 44* 42* 35* 42*  CREATININE 0.72 0.86 0.79 0.73 0.75  CALCIUM 8.1* 7.8* 8.1* 8.5* 8.4*   Liver Function Tests: No results for input(s): "AST", "ALT", "ALKPHOS", "BILITOT", "PROT", "ALBUMIN" in the last 168 hours. No results for input(s):  "LIPASE", "AMYLASE" in the last 168 hours. No results for input(s): "AMMONIA" in the last 168 hours. CBC: Recent Labs  Lab 12/07/22 0030 12/07/22 0310 12/08/22 0505 12/09/22 0404 12/10/22 0348 12/11/22 0637 12/12/22 0559  WBC 13.8*   < > 6.9 7.9 8.1 7.1 9.1  NEUTROABS 11.6*  --   --   --   --   --   --   HGB 11.7*   < > 9.7* 8.6* 9.3* 9.5* 9.7*  HCT 36.7   < > 29.5* 26.1* 28.4* 28.9* 29.1*  MCV 92.2   < > 90.5 89.1 91.0 90.0 90.1  PLT 258   < > 226 218 256 318 326   < > = values in this interval not displayed.   Cardiac Enzymes: No results for input(s): "CKTOTAL", "CKMB", "CKMBINDEX", "TROPONINI" in the last 168 hours. BNP: Invalid input(s): "POCBNP" CBG: No results for input(s): "GLUCAP" in the last 168 hours. D-Dimer No results for input(s): "DDIMER" in the last 72 hours. Hgb A1c No results for input(s): "HGBA1C" in the last 72 hours. Lipid Profile No results for input(s): "CHOL", "HDL", "LDLCALC", "TRIG", "CHOLHDL", "LDLDIRECT" in the last 72 hours. Thyroid function studies No results for input(s): "TSH", "T4TOTAL", "T3FREE", "THYROIDAB" in the last 72 hours.  Invalid input(s): "FREET3" Anemia work up No results for input(s): "VITAMINB12", "FOLATE", "FERRITIN", "TIBC", "IRON", "RETICCTPCT" in the last 72 hours. Urinalysis    Component Value Date/Time   COLORURINE YELLOW 11/20/2022 0955   APPEARANCEUR CLEAR 11/20/2022 0955   APPEARANCEUR Hazy 05/14/2013 0951   LABSPEC 1.025 11/20/2022 0955   LABSPEC 1.026 05/14/2013 0951   PHURINE 6.0 11/20/2022 0955   GLUCOSEU NEGATIVE 11/20/2022 0955   GLUCOSEU Negative 05/14/2013 0951   HGBUR NEGATIVE 11/20/2022 0955   BILIRUBINUR SMALL (A) 11/20/2022 0955   BILIRUBINUR Negative 05/14/2013 0951   KETONESUR TRACE (A) 11/20/2022 0955   PROTEINUR 30 (A) 11/20/2022 0955   NITRITE NEGATIVE 11/20/2022 0955   LEUKOCYTESUR NEGATIVE 11/20/2022 0955   LEUKOCYTESUR 2+ 05/14/2013 0951   Sepsis Labs Recent Labs  Lab 12/09/22 0404  12/10/22 0348 12/11/22 0637 12/12/22 0559  WBC 7.9 8.1 7.1 9.1   Microbiology No results found for this or any previous visit (from the past 240 hour(s)).   Total time spend on discharging this patient, including the last patient exam, discussing the hospital stay, instructions for ongoing care as it relates to all pertinent caregivers, as well as preparing the medical discharge records, prescriptions, and/or referrals as applicable, is 35  minutes.    Darlin Priestly, MD  [facility] 12/13/2022, 12:01 PM

## 2023-01-17 ENCOUNTER — Emergency Department: Payer: 59

## 2023-01-17 ENCOUNTER — Inpatient Hospital Stay: Payer: 59

## 2023-01-17 ENCOUNTER — Inpatient Hospital Stay
Admit: 2023-01-17 | Discharge: 2023-01-17 | Disposition: A | Discharge status: Home / Self Care | Payer: 59 | Attending: Internal Medicine | Admitting: Internal Medicine

## 2023-01-17 ENCOUNTER — Inpatient Hospital Stay
Admission: EM | Admit: 2023-01-17 | Discharge: 2023-01-23 | DRG: 175 | Disposition: A | Discharge status: Skilled Nursing Facility | Payer: 59 | Source: Skilled Nursing Facility | Attending: Internal Medicine | Admitting: Internal Medicine

## 2023-01-17 DIAGNOSIS — A419 Sepsis, unspecified organism: Principal | ICD-10-CM

## 2023-01-17 DIAGNOSIS — R6520 Severe sepsis without septic shock: Secondary | ICD-10-CM

## 2023-01-17 DIAGNOSIS — E860 Dehydration: Secondary | ICD-10-CM | POA: Diagnosis present

## 2023-01-17 DIAGNOSIS — G9341 Metabolic encephalopathy: Secondary | ICD-10-CM

## 2023-01-17 DIAGNOSIS — I10 Essential (primary) hypertension: Secondary | ICD-10-CM | POA: Diagnosis present

## 2023-01-17 DIAGNOSIS — Z882 Allergy status to sulfonamides status: Secondary | ICD-10-CM

## 2023-01-17 DIAGNOSIS — F17210 Nicotine dependence, cigarettes, uncomplicated: Secondary | ICD-10-CM | POA: Diagnosis present

## 2023-01-17 DIAGNOSIS — M170 Bilateral primary osteoarthritis of knee: Secondary | ICD-10-CM | POA: Diagnosis present

## 2023-01-17 DIAGNOSIS — F01C18 Vascular dementia, severe, with other behavioral disturbance: Secondary | ICD-10-CM | POA: Diagnosis present

## 2023-01-17 DIAGNOSIS — E870 Hyperosmolality and hypernatremia: Secondary | ICD-10-CM

## 2023-01-17 DIAGNOSIS — Z881 Allergy status to other antibiotic agents status: Secondary | ICD-10-CM | POA: Diagnosis not present

## 2023-01-17 DIAGNOSIS — E162 Hypoglycemia, unspecified: Secondary | ICD-10-CM | POA: Diagnosis not present

## 2023-01-17 DIAGNOSIS — E876 Hypokalemia: Secondary | ICD-10-CM | POA: Insufficient documentation

## 2023-01-17 DIAGNOSIS — B964 Proteus (mirabilis) (morganii) as the cause of diseases classified elsewhere: Secondary | ICD-10-CM | POA: Diagnosis present

## 2023-01-17 DIAGNOSIS — N390 Urinary tract infection, site not specified: Secondary | ICD-10-CM | POA: Diagnosis present

## 2023-01-17 DIAGNOSIS — Z886 Allergy status to analgesic agent status: Secondary | ICD-10-CM | POA: Diagnosis not present

## 2023-01-17 DIAGNOSIS — R7989 Other specified abnormal findings of blood chemistry: Secondary | ICD-10-CM | POA: Diagnosis present

## 2023-01-17 DIAGNOSIS — I214 Non-ST elevation (NSTEMI) myocardial infarction: Secondary | ICD-10-CM

## 2023-01-17 DIAGNOSIS — J9601 Acute respiratory failure with hypoxia: Secondary | ICD-10-CM | POA: Diagnosis present

## 2023-01-17 DIAGNOSIS — F03918 Unspecified dementia, unspecified severity, with other behavioral disturbance: Secondary | ICD-10-CM | POA: Diagnosis present

## 2023-01-17 DIAGNOSIS — F32A Depression, unspecified: Secondary | ICD-10-CM | POA: Diagnosis present

## 2023-01-17 DIAGNOSIS — Z515 Encounter for palliative care: Secondary | ICD-10-CM | POA: Diagnosis not present

## 2023-01-17 DIAGNOSIS — F02C18 Dementia in other diseases classified elsewhere, severe, with other behavioral disturbance: Secondary | ICD-10-CM | POA: Diagnosis present

## 2023-01-17 DIAGNOSIS — R627 Adult failure to thrive: Secondary | ICD-10-CM | POA: Diagnosis present

## 2023-01-17 DIAGNOSIS — I2609 Other pulmonary embolism with acute cor pulmonale: Secondary | ICD-10-CM | POA: Diagnosis not present

## 2023-01-17 DIAGNOSIS — E43 Unspecified severe protein-calorie malnutrition: Secondary | ICD-10-CM | POA: Diagnosis present

## 2023-01-17 DIAGNOSIS — Z7901 Long term (current) use of anticoagulants: Secondary | ICD-10-CM | POA: Diagnosis not present

## 2023-01-17 DIAGNOSIS — E871 Hypo-osmolality and hyponatremia: Secondary | ICD-10-CM | POA: Diagnosis not present

## 2023-01-17 DIAGNOSIS — Z681 Body mass index (BMI) 19 or less, adult: Secondary | ICD-10-CM

## 2023-01-17 DIAGNOSIS — Z96641 Presence of right artificial hip joint: Secondary | ICD-10-CM | POA: Diagnosis present

## 2023-01-17 DIAGNOSIS — Z79899 Other long term (current) drug therapy: Secondary | ICD-10-CM

## 2023-01-17 DIAGNOSIS — S72002A Fracture of unspecified part of neck of left femur, initial encounter for closed fracture: Secondary | ICD-10-CM | POA: Diagnosis present

## 2023-01-17 DIAGNOSIS — N3001 Acute cystitis with hematuria: Secondary | ICD-10-CM

## 2023-01-17 DIAGNOSIS — G309 Alzheimer's disease, unspecified: Secondary | ICD-10-CM | POA: Diagnosis present

## 2023-01-17 DIAGNOSIS — F419 Anxiety disorder, unspecified: Secondary | ICD-10-CM | POA: Diagnosis present

## 2023-01-17 DIAGNOSIS — R9431 Abnormal electrocardiogram [ECG] [EKG]: Secondary | ICD-10-CM | POA: Diagnosis not present

## 2023-01-17 LAB — URINALYSIS, ROUTINE W REFLEX MICROSCOPIC
Bilirubin Urine: NEGATIVE
Glucose, UA: NEGATIVE mg/dL
Hgb urine dipstick: NEGATIVE
Ketones, ur: 5 mg/dL — AB
Nitrite: NEGATIVE
Protein, ur: 30 mg/dL — AB
Specific Gravity, Urine: 1.027 (ref 1.005–1.030)
WBC, UA: >50 WBC/hpf (ref 0–5)
pH: 5 (ref 5.0–8.0)

## 2023-01-17 LAB — COMPREHENSIVE METABOLIC PANEL
ALT: 18 U/L (ref 0–44)
AST: 37 U/L (ref 15–41)
Albumin: 3.3 g/dL — ABNORMAL LOW (ref 3.5–5.0)
Alkaline Phosphatase: 80 U/L (ref 38–126)
Anion gap: 10 (ref 5–15)
BUN: 53 mg/dL — ABNORMAL HIGH (ref 8–23)
CO2: 24 mmol/L (ref 22–32)
Calcium: 8.5 mg/dL — ABNORMAL LOW (ref 8.9–10.3)
Chloride: 117 mmol/L — ABNORMAL HIGH (ref 98–111)
Creatinine, Ser: 0.74 mg/dL (ref 0.44–1.00)
GFR, Estimated: >60 mL/min (ref >60)
Glucose, Bld: 99 mg/dL (ref 70–99)
Potassium: 3.3 mmol/L — ABNORMAL LOW (ref 3.5–5.1)
Sodium: 151 mmol/L — ABNORMAL HIGH (ref 135–145)
Total Bilirubin: 1.7 mg/dL — ABNORMAL HIGH (ref 0.3–1.2)
Total Protein: 6 g/dL — ABNORMAL LOW (ref 6.5–8.1)

## 2023-01-17 LAB — CBC WITH DIFFERENTIAL/PLATELET
Abs Immature Granulocytes: 0.13 10*3/uL — ABNORMAL HIGH (ref 0.00–0.07)
Basophils Absolute: 0.1 10*3/uL (ref 0.0–0.1)
Basophils Relative: 0 %
Eosinophils Absolute: 0 10*3/uL (ref 0.0–0.5)
Eosinophils Relative: 0 %
HCT: 44 % (ref 36.0–46.0)
Hemoglobin: 14 g/dL (ref 12.0–15.0)
Immature Granulocytes: 1 %
Lymphocytes Relative: 16 %
Lymphs Abs: 2.6 10*3/uL (ref 0.7–4.0)
MCH: 30.2 pg (ref 26.0–34.0)
MCHC: 31.8 g/dL (ref 30.0–36.0)
MCV: 95 fL (ref 80.0–100.0)
Monocytes Absolute: 1.4 10*3/uL — ABNORMAL HIGH (ref 0.1–1.0)
Monocytes Relative: 8 %
Neutro Abs: 12.3 10*3/uL — ABNORMAL HIGH (ref 1.7–7.7)
Neutrophils Relative %: 75 %
Platelets: 252 10*3/uL (ref 150–400)
RBC: 4.63 MIL/uL (ref 3.87–5.11)
RDW: 13.8 % (ref 11.5–15.5)
WBC: 16.5 10*3/uL — ABNORMAL HIGH (ref 4.0–10.5)
nRBC: 0 % (ref 0.0–0.2)

## 2023-01-17 LAB — LACTIC ACID, PLASMA
Lactic Acid, Venous: 1.5 mmol/L (ref 0.5–1.9)
Lactic Acid, Venous: 1.1 mmol/L (ref 0.5–1.9)

## 2023-01-17 LAB — PROTIME-INR
INR: 1.3 — ABNORMAL HIGH (ref 0.8–1.2)
Prothrombin Time: 16.3 s — ABNORMAL HIGH (ref 11.4–15.2)

## 2023-01-17 LAB — PROCALCITONIN: Procalcitonin: 0.11 ng/mL

## 2023-01-17 LAB — D-DIMER, QUANTITATIVE: D-Dimer, Quant: 3.06 ug{FEU}/mL — ABNORMAL HIGH (ref 0.00–0.50)

## 2023-01-17 LAB — TROPONIN I (HIGH SENSITIVITY)
Troponin I (High Sensitivity): 677 ng/L (ref <18)
Troponin I (High Sensitivity): 633 ng/L (ref <18)

## 2023-01-17 LAB — APTT: aPTT: 35 s (ref 24–36)

## 2023-01-17 LAB — SODIUM: Sodium: 140 mmol/L (ref 135–145)

## 2023-01-17 LAB — BRAIN NATRIURETIC PEPTIDE: B Natriuretic Peptide: 814.3 pg/mL — ABNORMAL HIGH (ref 0.0–100.0)

## 2023-01-17 LAB — MAGNESIUM: Magnesium: 2.2 mg/dL (ref 1.7–2.4)

## 2023-01-17 MED ORDER — SODIUM CHLORIDE 0.9 % IV SOLN
2.0000 g | INTRAVENOUS | Status: AC
  Administered 2023-01-17 – 2023-01-21 (×5): 2 g via INTRAVENOUS
  Filled 2023-01-17 (×5): qty 20

## 2023-01-17 MED ORDER — POTASSIUM CHLORIDE 2 MEQ/ML IV SOLN
INTRAVENOUS | Status: DC
  Filled 2023-01-17 (×2): qty 1000

## 2023-01-17 MED ORDER — ACETAMINOPHEN 325 MG PO TABS: 650.0000 mg | ORAL_TABLET | Freq: Four times a day (QID) | ORAL | Status: DC | PRN

## 2023-01-17 MED ORDER — LACTATED RINGERS IV BOLUS
1000.0000 mL | Freq: Once | INTRAVENOUS | Status: AC
  Administered 2023-01-17: 1000 mL via INTRAVENOUS

## 2023-01-17 MED ORDER — HEPARIN BOLUS VIA INFUSION
3000.0000 [IU] | Freq: Once | INTRAVENOUS | Status: AC
  Administered 2023-01-17: 3000 [IU] via INTRAVENOUS
  Filled 2023-01-17: qty 3000

## 2023-01-17 MED ORDER — ACETAMINOPHEN 650 MG RE SUPP
650.0000 mg | Freq: Four times a day (QID) | RECTAL | Status: DC | PRN
Start: 2023-01-17 — End: 2023-01-23

## 2023-01-17 MED ORDER — HEPARIN (PORCINE) 25000 UT/250ML-% IV SOLN
1250.0000 [IU]/h | INTRAVENOUS | Status: AC
  Administered 2023-01-17: 600 [IU]/h via INTRAVENOUS
  Administered 2023-01-18 – 2023-01-20 (×2): 900 [IU]/h via INTRAVENOUS
  Administered 2023-01-21: 1050 [IU]/h via INTRAVENOUS
  Administered 2023-01-22: 1150 [IU]/h via INTRAVENOUS
  Administered 2023-01-23: 1250 [IU]/h via INTRAVENOUS
  Filled 2023-01-17 (×6): qty 250

## 2023-01-17 MED ORDER — SENNOSIDES-DOCUSATE SODIUM 8.6-50 MG PO TABS: 1.0000 | ORAL_TABLET | Freq: Every evening | ORAL | Status: DC | PRN

## 2023-01-17 MED ORDER — ENOXAPARIN SODIUM 40 MG/0.4ML IJ SOSY: 40.0000 mg | PREFILLED_SYRINGE | INTRAMUSCULAR | Status: DC

## 2023-01-17 MED ORDER — IOHEXOL 350 MG/ML SOLN
75.0000 mL | Freq: Once | INTRAVENOUS | Status: AC | PRN
  Administered 2023-01-17: 52 mL via INTRAVENOUS

## 2023-01-17 NOTE — Sepsis Progress Note (Signed)
[facility] will follow per sepsis protocol  

## 2023-01-17 NOTE — Consult Note (Signed)
CODE SEPSIS - PHARMACY COMMUNICATION  **Broad Spectrum Antibiotics should be administered within 1 hour of Sepsis diagnosis**  Time Code Sepsis Called/Page Received: 1454  Antibiotics Ordered: ceftriaxone  Time of 1st antibiotic administration: 1502  Additional action taken by pharmacy: n/a  If necessary, Name of Provider/Nurse Contacted: n/a    Bettey Costa ,PharmD Clinical Pharmacist  01/17/2023  3:02 PM

## 2023-01-17 NOTE — ED Provider Notes (Signed)
[facility] Provider Note    Event Date/Time   First MD Initiated Contact with Patient 01/17/23 1334     (approximate)   History   Chief Complaint Altered Mental Status   HPI  Kaylee Robinson is a 77 y.o. female with past medical history of hypertension and dementia who presents to the ED for altered mental status.  Per EMS, patient has been residing at [facility] and staff has noted increasing confusion over the past 2 to 3 days.  During this time, patient has not been able to take any of her medications and has not eaten or drink anything.  They have not noticed any vomiting but she has been unable to follow commands with repetitive mumbling speech.  Patient does have advanced dementia and is reportedly difficult to understand at baseline.  Staff reported to EMS that patient had elevated sodium and they have been giving IV fluids at the facility due to concern for dehydration.     Physical Exam   Triage Vital Signs: ED Triage Vitals [01/17/23 1334]  Encounter Vitals Group     BP 116/70     Systolic BP Percentile      Diastolic BP Percentile      Pulse Rate 94     Resp 16     Temp 97.8 F (36.6 C)     Temp Source Axillary     SpO2 95 %     Weight 105 lb 13.1 oz (48 kg)     Height 5\' 6"  (1.676 m)     Head Circumference      Peak Flow      Pain Score 0     Pain Loc      Pain Education      Exclude from Growth Chart     Most recent vital signs: Vitals:   01/17/23 1430 01/17/23 1530  BP: 106/78 96/78  Pulse: 92 100  Resp:    Temp:    SpO2: 99% 97%    Constitutional: Awake and alert, disoriented and unable to follow commands. Eyes: Conjunctivae are normal.  Pupils equal, round, and reactive to light bilaterally. Head: Atraumatic. Nose: No congestion/rhinnorhea. Mouth/Throat: Mucous membranes are moist.  Cardiovascular: Normal rate, regular rhythm. Grossly normal heart sounds.  2+ radial pulses bilaterally. Respiratory: Normal  respiratory effort.  No retractions. Lungs CTAB. Gastrointestinal: Soft and nontender. No distention. Musculoskeletal: No lower extremity tenderness nor edema.  Neurologic: Mumbling and incoherent speech.. No gross focal neurologic deficits are appreciated, patient localizes to pain in all 4 extremities.    ED Results / Procedures / Treatments   Labs (all labs ordered are listed, but only abnormal results are displayed) Labs Reviewed  CBC WITH DIFFERENTIAL/PLATELET - Abnormal; Notable for the following components:      Result Value   WBC 16.5 (*)    Neutro Abs 12.3 (*)    Monocytes Absolute 1.4 (*)    Abs Immature Granulocytes 0.13 (*)    All other components within normal limits  COMPREHENSIVE METABOLIC PANEL - Abnormal; Notable for the following components:   Sodium 151 (*)    Potassium 3.3 (*)    Chloride 117 (*)    BUN 53 (*)    Calcium 8.5 (*)    Total Protein 6.0 (*)    Albumin 3.3 (*)    Total Bilirubin 1.7 (*)    All other components within normal limits  URINALYSIS, ROUTINE W REFLEX MICROSCOPIC - Abnormal; Notable for the following  components:   Color, Urine AMBER (*)    APPearance CLOUDY (*)    Ketones, ur 5 (*)    Protein, ur 30 (*)    Leukocytes,Ua MODERATE (*)    Bacteria, UA FEW (*)    All other components within normal limits  TROPONIN I (HIGH SENSITIVITY) - Abnormal; Notable for the following components:   Troponin I (High Sensitivity) 677 (*)    All other components within normal limits  CULTURE, BLOOD (ROUTINE X 2)  CULTURE, BLOOD (ROUTINE X 2)  URINE CULTURE  MAGNESIUM  LACTIC ACID, PLASMA  LACTIC ACID, PLASMA  PROCALCITONIN  TROPONIN I (HIGH SENSITIVITY)     EKG  ED ECG REPORT I, [name], the attending physician, personally viewed and interpreted this ECG.   Date: 01/17/2023  EKG Time: 13:40  Rate: 100  Rhythm: sinus tachycardia  Axis: Normal  Intervals:none  ST&T Change: Nonspecific ST/T abnormality  RADIOLOGY CT head  reviewed and interpreted by me with no hemorrhage or midline shift.  PROCEDURES:  Critical Care performed: Yes, see critical care procedure note(s)  .Critical Care  Performed by: [name], MD Authorized by: [name], MD   Critical care provider statement:    Critical care time (minutes):  30   Critical care time was exclusive of:  Separately billable procedures and treating other patients and teaching time   Critical care was necessary to treat or prevent imminent or life-threatening deterioration of the following conditions:  Sepsis and cardiac failure   Critical care was time spent personally by me on the following activities:  Development of treatment plan with patient or surrogate, discussions with consultants, evaluation of patient's response to treatment, examination of patient, ordering and review of laboratory studies, ordering and review of radiographic studies, ordering and performing treatments and interventions, pulse oximetry, re-evaluation of patient's condition and review of old charts   I assumed direction of critical care for this patient from another provider in my specialty: no     Care discussed with: admitting provider      MEDICATIONS ORDERED IN ED: Medications  cefTRIAXone (ROCEPHIN) 2 g in sodium chloride 0.9 % 100 mL IVPB (0 g Intravenous Stopped 01/17/23 1549)  lactated ringers bolus 1,000 mL (has no administration in time range)     IMPRESSION / MDM / ASSESSMENT AND PLAN / ED COURSE  I reviewed the triage vital signs and the nursing notes.                              77 y.o. female with past medical history of hypertension and dementia who presents to the ED for increased confusion with inability to swallow for the past 3 to 4 days.  Patient's presentation is most consistent with acute presentation with potential threat to life or bodily function.  Differential diagnosis includes, but is not limited to, stroke, TIA, sepsis, electrolyte  abnormality, AKI, UTI, dementia.  Patient chronically ill but nontoxic-appearing and in no acute distress, vital signs are unremarkable.  EKG shows no evidence of arrhythmia, nonspecific ST/T abnormality noted.  Patient has a nonfocal neurologic exam, localizes to pain in all 4 extremities but does appear altered from her reported baseline.  We will check CT head, assess for infectious process with chest x-ray and urinalysis.  Labs are also pending at this time.  Rectal temperature noted to be 100.0 and patient with significant leukocytosis, sepsis workup initiated and she was started on antibiotics for  apparent UTI.  CT head is negative for acute process, chest x-ray unremarkable by my read.  Additional labs show no significant anemia or AKI, patient does have hypernatremia likely due to dehydration given her elevated BUN to creatinine ratio.  Patient also noted to have significant elevation in troponin at 677, nonspecific ST abnormalities noted on EKG.  Suspect this is due to sepsis but will start on heparin, case discussed with hospitalist for admission.      FINAL CLINICAL IMPRESSION(S) / ED DIAGNOSES   Final diagnoses:  Sepsis, due to unspecified organism, unspecified whether acute organ dysfunction present (HCC)  Acute cystitis with hematuria  NSTEMI (non-ST elevated myocardial infarction) (HCC)  Hypernatremia     Rx / DC Orders   ED Discharge Orders     None        Note:  This document was prepared using Dragon voice recognition software and may include unintentional dictation errors.   [name], MD 01/17/23 [phone]

## 2023-01-17 NOTE — ED Notes (Signed)
ERP notified of pt critical troponin, no new orders at this time

## 2023-01-17 NOTE — ED Notes (Signed)
Messaged Marcelino Duster, MD about pt's recently resulted CT scan

## 2023-01-17 NOTE — Consult Note (Signed)
PHARMACY - ANTICOAGULATION CONSULT NOTE  Pharmacy Consult for Continuous Heparin Infusion Indication:  NSTEMI  Allergies  Allergen Reactions   Enalapril Other (See Comments)   Naproxen Swelling   Amoxicillin Rash   Sulfa Antibiotics Rash    Patient Measurements: Height: 5\' 6"  (167.6 cm) Weight: 48 kg (105 lb 13.1 oz) IBW/kg (Calculated) : 59.3 Heparin Dosing Weight: 48 kg  Vital Signs: Temp: 100 F (37.8 C) (10/30 1351) Temp Source: Rectal (10/30 1351) BP: 96/78 (10/30 1530) Pulse Rate: 100 (10/30 1530)  Labs: Recent Labs    01/17/23 1352  HGB 14.0  HCT 44.0  PLT 252  CREATININE 0.74  TROPONINIHS 677*    Estimated Creatinine Clearance: 44.6 mL/min (by C-G formula based on SCr of 0.74 mg/dL).   Medical History: Past Medical History:  Diagnosis Date   Alzheimer disease (HCC)    Arthritis    bilateral knees   Hypertension    Vascular dementia (HCC)     Medications:  No history of chronic anticoagulation PTA  Assessment: 77 y.o. female with past medical history of hypertension and dementia who presents to the ED for altered mental status.  Per EMS, patient has been residing at [facility] and staff has noted increasing confusion over the past 2 to 3 days. EKG shows no evidence of arrhythmia, nonspecific ST/T abnormality noted. Pharmacy has been consulted for continuous heparin infusion.  Baseline labs have been ordered and are pending.  Goal of Therapy:  Heparin level 0.3-0.7 units/ml Monitor platelets by anticoagulation protocol: Yes   Plan:  Give 3000 units bolus x 1 Start heparin infusion at 600 units/hr Check anti-Xa level in 8 hours and daily while on heparin Continue to monitor H&H and platelets  Emmajane Altamura A Oaklynn Stierwalt 01/17/2023,4:26 PM

## 2023-01-17 NOTE — ED Notes (Signed)
Fall precautions in place: fall band on pt, non skid socks, bed alarm on, call light within reach, pt at room near nurses station, bed low and locked.

## 2023-01-17 NOTE — H&P (Addendum)
History and Physical    [name] WUJ:[id number] DOB: [birth year] DOA: 01/17/2023 DOS: the patient was seen and examined on 01/17/2023 PCP: [facility]  Patient coming from: SNF  Chief Complaint:  Chief Complaint  Patient presents with   Altered Mental Status   HPI: Kaylee Robinson is a 77 y.o. female with medical history significant of dementia, hypertension, last admission 12/07/2022 for hip fracture presented to the emergency department for evaluation of altered mental status.  Patient is white documented resident has been more confused for past 2 to 3 days.  Patient has been not eating well as per ED provider.  During my exam she has repetitive mumbling speech and does not answer my questions.  Patient has advanced dementia but her mental status has worsened from baseline per nursing staff.  In the emergency department, her vital signs are stable, laboratory workup showed sodium of 151, potassium 3.3, BUN 53, creatinine 0.74, BNP 814, troponin of 677, lactic acid 1.5, white count 16.5, D-dimer 3.06.  UA remarkable for possible UTI, CT head unremarkable.  Patient was given IV fluids, IV Rocephin, heparin infusion in the emergency department.  ED physician requested hospitalist admission for further management evaluation of metabolic encephalopathy secondary to possible UTI, elevated troponin in the setting of infection.  Review of Systems: unable to review all systems due to the inability of the patient to answer questions. Past Medical History:  Diagnosis Date   Alzheimer disease (HCC)    Arthritis    bilateral knees   Hypertension    Vascular dementia [facility])    Past Surgical History:  Procedure Laterality Date   HIP ARTHROPLASTY Right 12/07/2022   Procedure: ARTHROPLASTY BIPOLAR HIP (HEMIARTHROPLASTY);  Surgeon: Christena Flake, MD;  Location: [facility] ORS;  Service: Orthopedics;  Laterality: Right;   HIP SURGERY     INTRAMEDULLARY (IM) NAIL INTERTROCHANTERIC  Left 09/20/2018   Procedure: INTRAMEDULLARY (IM) NAIL INTERTROCHANTRIC;  Surgeon: Juanell Fairly, MD;  Location: [facility] ORS;  Service: Orthopedics;  Laterality: Left;   TONSILLECTOMY     adnoidectomy   Social History:  reports that she has been smoking cigarettes. She has never used smokeless tobacco. She reports that she does not drink alcohol and does not use drugs.  Allergies  Allergen Reactions   Enalapril Other (See Comments)   Naproxen Swelling   Amoxicillin Rash   Sulfa Antibiotics Rash    Family History  Problem Relation Age of Onset   Breast cancer Neg Hx     Prior to Admission medications   Medication Sig Start Date End Date Taking? Authorizing Provider  acetaminophen (TYLENOL) 325 MG tablet Take 650 mg by mouth 3 (three) times daily. 11/21/22   [provider]  alum & mag hydroxide-simeth (MAALOX/MYLANTA) 200-200-20 MG/5ML suspension Take 30 mLs by mouth every 4 (four) hours as needed for indigestion. 09/23/18   Jimmye Norman, NP  busPIRone (BUSPAR) 5 MG tablet Take 5 mg by mouth 2 (two) times daily. 10/23/22   [provider]  Cyanocobalamin (B-12) 1000 MCG CAPS Take 1 capsule by mouth daily. 09/28/22   [provider]  divalproex (DEPAKOTE) 250 MG DR tablet Take 250 mg by mouth 2 (two) times daily. 10/23/22   [provider]  docusate sodium (COLACE) 100 MG capsule Take 1 capsule (100 mg total) by mouth 2 (two) times daily. 09/23/18   Jimmye Norman, NP  enoxaparin (LOVENOX) 40 MG/0.4ML injection Inject 0.4 mLs (40 mg total) into the skin daily. 12/11/22  Anson Oregon, PA-C  feeding supplement (ENSURE ENLIVE / ENSURE PLUS) LIQD Take 237 mLs by mouth 3 (three) times daily between meals. 12/13/22   Darlin Priestly, MD  ferrous sulfate 325 (65 FE) MG tablet Take 1 tablet (325 mg total) by mouth 3 (three) times daily after meals. 09/23/18   Jimmye Norman, NP  haloperidol (HALDOL) 2 MG tablet Take 2 mg by mouth every 8 (eight)  hours as needed. 10/19/22   [provider]  HYDROcodone-acetaminophen (NORCO/VICODIN) 5-325 MG tablet Take 1 tablet by mouth every 6 (six) hours as needed for moderate pain. 12/11/22   Anson Oregon, PA-C  Melatonin 5 MG TABS Take 1 tablet (5 mg total) by mouth at bedtime as needed (sleep). 09/23/18   Jimmye Norman, NP  memantine (NAMENDA) 10 MG tablet Take 10 mg by mouth 2 (two) times daily. 10/23/22   [provider]  Multiple Vitamin (MULTIVITAMIN WITH MINERALS) TABS tablet Take 1 tablet by mouth daily. 12/14/22   Darlin Priestly, MD  nicotine (NICODERM CQ - DOSED IN MG/24 HR) 7 mg/24hr patch Place 1 patch (7 mg total) onto the skin daily. 12/14/22   Darlin Priestly, MD  polyethylene glycol (MIRALAX / GLYCOLAX) 17 g packet Take 17 g by mouth daily as needed for mild constipation. 09/23/18   Jimmye Norman, NP  QUEtiapine (SEROQUEL) 50 MG tablet Take 1 tablet (50 mg total) by mouth at bedtime as needed (Agitation). 12/13/22   Darlin Priestly, MD  sertraline (ZOLOFT) 50 MG tablet Take 50 mg by mouth daily. 09/12/18   [provider]  traZODone (DESYREL) 50 MG tablet Take 50 mg by mouth at bedtime. 10/23/22   [provider]  VITAMIN D-1000 MAX ST 25 MCG (1000 UT) tablet Take 1,000 Units by mouth daily. 09/28/22   [provider]    Physical Exam: Vitals:   01/17/23 1334 01/17/23 1351 01/17/23 1430 01/17/23 1530  BP: 116/70  106/78 96/78  Pulse: 94  92 100  Resp: 16     Temp: 97.8 F (36.6 C) 100 F (37.8 C)    TempSrc: Axillary Rectal    SpO2: 95%  99% 97%  Weight: 48 kg     Height: 5\' 6"  (1.676 m)      General - Elderly thin built ill female, mumbling, does not answer questions HEENT - PERRLA, EOMI, atraumatic head, non tender sinuses. Lung - Clear, rales, rhonchi, wheezes. Heart - S1, S2 heard, no murmurs, rubs, no pedal edema. Abdomen-soft, nontender, nondistended. Neuro - Alert, does not follow commands, unable to do full neuroexam Skin -  Warm and dry. Data Reviewed:     Latest Ref Rng & Units 01/17/2023    1:52 PM 12/12/2022    5:59 AM 12/11/2022    6:37 AM  CBC  WBC 4.0 - 10.5 K/uL 16.5  9.1  7.1   Hemoglobin 12.0 - 15.0 g/dL 84.6  9.7  9.5   Hematocrit 36.0 - 46.0 % 44.0  29.1  28.9   Platelets 150 - 400 K/uL 252  326  318       Latest Ref Rng & Units 01/17/2023    1:52 PM 12/12/2022    5:59 AM 12/11/2022    6:37 AM  BMP  Glucose 70 - 99 mg/dL 99  962  952   BUN 8 - 23 mg/dL 53  42  35   Creatinine 0.44 - 1.00 mg/dL 8.41  3.24  4.01   Sodium 135 - 145  mmol/L 151  135  137   Potassium 3.5 - 5.1 mmol/L 3.3  4.6  4.7   Chloride 98 - 111 mmol/L 117  102  106   CO2 22 - 32 mmol/L 24  23  24    Calcium 8.9 - 10.3 mg/dL 8.5  8.4  8.5    DG Chest Portable 1 View  Result Date: 01/17/2023 CLINICAL DATA:  77 year old female with altered mental status and weakness. EXAM: PORTABLE CHEST 1 VIEW COMPARISON:  Portable chest 12/06/2022 and earlier. FINDINGS: Portable AP upright view at 1434 hours. Large lung volumes. Stable cardiac size and mediastinal contours. Tortuous descending thoracic aorta. Visualized tracheal air column is within normal limits. Allowing for portable technique the lungs are clear. No pneumothorax or pleural effusion. No acute osseous abnormality identified. Negative visible bowel gas. IMPRESSION: Chronic hyperinflation.  No acute cardiopulmonary abnormality. Electronically Signed   By: Odessa Fleming M.D.   On: 01/17/2023 16:29   CT Head Wo Contrast  Result Date: 01/17/2023 CLINICAL DATA:  Altered mental status EXAM: CT HEAD WITHOUT CONTRAST TECHNIQUE: Contiguous axial images were obtained from the base of the skull through the vertex without intravenous contrast. RADIATION DOSE REDUCTION: This exam was performed according to the departmental dose-optimization program which includes automated exposure control, adjustment of the mA and/or kV according to patient size and/or use of iterative reconstruction technique.  COMPARISON:  11/20/2022 FINDINGS: Brain: No evidence of acute infarction, hemorrhage, mass, mass effect, or midline shift. No hydrocephalus or extra-axial fluid collection. Periventricular white matter changes, likely the sequela of chronic small vessel ischemic disease. Age related cerebral atrophy. Vascular: No hyperdense vessel. Atherosclerotic calcifications in the intracranial carotid and vertebral arteries. Skull: Negative for fracture or focal lesion. Sinuses/Orbits: No acute finding. Other: The mastoid air cells are well aerated. IMPRESSION: No acute intracranial process. Electronically Signed   By: Wiliam Ke M.D.   On: 01/17/2023 15:43     Assessment and Plan: Kaylee Robinson is a 77 y.o. female with medical history significant of dementia, hypertension, last admission 12/07/2022 for hip fracture presented to the emergency department for evaluation of altered mental status.  She has been having worsening confusion, poor oral intake.  Laboratory findings consistent with hyponatremia, hypokalemia, elevated white count, BNP, D-dimer, troponins.  Plan: Acute metabolic encephalopathy: She does have advanced dementia, this is possibly worsening dementia in the setting of electrolyte abnormalities due to poor oral intake, dehydration. CT scan head unremarkable. Continue neurochecks. Delirium precautions. Fall, aspiration precautions.  Hypernatremia- In the setting of poor oral intake, dehydration Gentle IV fluids with patient supplementation Slow correction of sodium. Telemetry monitoring. Trend sodium closely.  Hypokalemia- IV fluids with potassium supplementation Monitor daily electrolytes.  Leukocytosis- Due to possible UTI.  Continue IV Rocephin therapy. Monitor daily WBC.  Follow culture results.  Elevated D-dimer-I will check CTA chest, lower extremity ultrasound rule out DVT.  Elevated troponins-possible demand ischemia in the setting of infection.  Continue heparin drip per  protocol.   BNP elevated, she does not seem to be volume overloaded, will check echocardiogram.  EKG unremarkable.   Will get official cardiology consultation tomorrow, I do not think she is a candidate for further cardiac workup given her advanced dementia.  Poor oral intake Failure to thrive: Moderate malnutrition: She has been eating poorly due to advanced dementia. Her overall prognosis is poor. Speech and swallow evaluation before starting diet. Dietitian evaluation. And lastly palliative evaluation for goals of care discussion with family.  DVT prophylaxis-on heparin drip  Advance Care Planning:   Code Status: Full Code per records.  She will need palliative goals of care discussion  Consults: None  Family Communication: No family at bedside  Severity of Illness: The appropriate patient status for this patient is INPATIENT. Inpatient status is judged to be reasonable and necessary in order to provide the required intensity of service to ensure the patient's safety. The patient's presenting symptoms, physical exam findings, and initial radiographic and laboratory data in the context of their chronic comorbidities is felt to place them at high risk for further clinical deterioration. Furthermore, it is not anticipated that the patient will be medically stable for discharge from the hospital within 2 midnights of admission.   * I certify that at the point of admission it is my clinical judgment that the patient will require inpatient hospital care spanning beyond 2 midnights from the point of admission due to high intensity of service, high risk for further deterioration and high frequency of surveillance required.*  Author: Marcelino Duster, MD 01/17/2023 4:03 PM  For on call review [email or web address].

## 2023-01-17 NOTE — ED Triage Notes (Signed)
Pt from [facility], [place] and has not had any oral intake for a few days, sent here for sodium of 154. Pt arrived with an IV they had been giving her IVF, Pt has dementia, and has been off her home medications for days due to increased altered mental status.

## 2023-01-17 NOTE — ED Notes (Signed)
Per Marcelino Duster, MD this RN Regino Bellow, MD in relation to pt's recently resulted CT scan.

## 2023-01-18 ENCOUNTER — Inpatient Hospital Stay: Payer: 59

## 2023-01-18 DIAGNOSIS — F03918 Unspecified dementia, unspecified severity, with other behavioral disturbance: Secondary | ICD-10-CM

## 2023-01-18 DIAGNOSIS — A419 Sepsis, unspecified organism: Secondary | ICD-10-CM | POA: Diagnosis not present

## 2023-01-18 DIAGNOSIS — F419 Anxiety disorder, unspecified: Secondary | ICD-10-CM

## 2023-01-18 DIAGNOSIS — Z515 Encounter for palliative care: Secondary | ICD-10-CM

## 2023-01-18 DIAGNOSIS — R6520 Severe sepsis without septic shock: Secondary | ICD-10-CM

## 2023-01-18 DIAGNOSIS — I2609 Other pulmonary embolism with acute cor pulmonale: Principal | ICD-10-CM

## 2023-01-18 DIAGNOSIS — R7989 Other specified abnormal findings of blood chemistry: Secondary | ICD-10-CM | POA: Diagnosis not present

## 2023-01-18 DIAGNOSIS — E870 Hyperosmolality and hypernatremia: Secondary | ICD-10-CM | POA: Diagnosis not present

## 2023-01-18 DIAGNOSIS — E43 Unspecified severe protein-calorie malnutrition: Secondary | ICD-10-CM | POA: Diagnosis not present

## 2023-01-18 DIAGNOSIS — E876 Hypokalemia: Secondary | ICD-10-CM | POA: Diagnosis not present

## 2023-01-18 DIAGNOSIS — F32A Depression, unspecified: Secondary | ICD-10-CM

## 2023-01-18 LAB — HEPARIN LEVEL (UNFRACTIONATED)
Heparin Unfractionated: 0.24 [IU]/mL — ABNORMAL LOW (ref 0.30–0.70)
Heparin Unfractionated: 0.18 [IU]/mL — ABNORMAL LOW (ref 0.30–0.70)

## 2023-01-18 LAB — BASIC METABOLIC PANEL
Anion gap: 9 (ref 5–15)
Anion gap: 8 (ref 5–15)
BUN: 43 mg/dL — ABNORMAL HIGH (ref 8–23)
BUN: 40 mg/dL — ABNORMAL HIGH (ref 8–23)
CO2: 24 mmol/L (ref 22–32)
CO2: 24 mmol/L (ref 22–32)
Calcium: 8.3 mg/dL — ABNORMAL LOW (ref 8.9–10.3)
Calcium: 8.1 mg/dL — ABNORMAL LOW (ref 8.9–10.3)
Chloride: 117 mmol/L — ABNORMAL HIGH (ref 98–111)
Chloride: 117 mmol/L — ABNORMAL HIGH (ref 98–111)
Creatinine, Ser: 0.57 mg/dL (ref 0.44–1.00)
Creatinine, Ser: 0.55 mg/dL (ref 0.44–1.00)
GFR, Estimated: >60 mL/min (ref >60)
GFR, Estimated: >60 mL/min (ref >60)
Glucose, Bld: 86 mg/dL (ref 70–99)
Glucose, Bld: 208 mg/dL — ABNORMAL HIGH (ref 70–99)
Potassium: 3.2 mmol/L — ABNORMAL LOW (ref 3.5–5.1)
Potassium: 3 mmol/L — ABNORMAL LOW (ref 3.5–5.1)
Sodium: 150 mmol/L — ABNORMAL HIGH (ref 135–145)
Sodium: 149 mmol/L — ABNORMAL HIGH (ref 135–145)

## 2023-01-18 LAB — CBC
HCT: 36.4 % (ref 36.0–46.0)
Hemoglobin: 11.7 g/dL — ABNORMAL LOW (ref 12.0–15.0)
MCH: 30.2 pg (ref 26.0–34.0)
MCHC: 32.1 g/dL (ref 30.0–36.0)
MCV: 94.1 fL (ref 80.0–100.0)
Platelets: 212 10*3/uL (ref 150–400)
RBC: 3.87 MIL/uL (ref 3.87–5.11)
RDW: 13.6 % (ref 11.5–15.5)
WBC: 13.7 10*3/uL — ABNORMAL HIGH (ref 4.0–10.5)
nRBC: 0 % (ref 0.0–0.2)

## 2023-01-18 LAB — BRAIN NATRIURETIC PEPTIDE: B Natriuretic Peptide: 579.6 pg/mL — ABNORMAL HIGH (ref 0.0–100.0)

## 2023-01-18 LAB — ECHOCARDIOGRAM COMPLETE
Area-P 1/2: 3.98 cm2
Height: 66 in
S' Lateral: 2 cm
Weight: 1693.13 [oz_av]

## 2023-01-18 LAB — TROPONIN I (HIGH SENSITIVITY): Troponin I (High Sensitivity): 417 ng/L (ref <18)

## 2023-01-18 MED ORDER — HEPARIN BOLUS VIA INFUSION
1500.0000 [IU] | Freq: Once | INTRAVENOUS | Status: AC
  Administered 2023-01-18: 1500 [IU] via INTRAVENOUS
  Filled 2023-01-18: qty 1500

## 2023-01-18 MED ORDER — KCL IN DEXTROSE-NACL 20-5-0.45 MEQ/L-%-% IV SOLN
INTRAVENOUS | Status: AC
  Filled 2023-01-18 (×2): qty 1000

## 2023-01-18 MED ORDER — HEPARIN BOLUS VIA INFUSION
700.0000 [IU] | Freq: Once | INTRAVENOUS | Status: AC
  Administered 2023-01-18: 700 [IU] via INTRAVENOUS
  Filled 2023-01-18: qty 700

## 2023-01-18 NOTE — ED Notes (Signed)
Rolled pt with EDT Arianne, changed brief and pad, performed peri care.

## 2023-01-18 NOTE — TOC Initial Note (Signed)
Transition of Care [facility]) - Initial/Assessment Note    Patient Details  Name: Kaylee Robinson MRN: [id number] Date of Birth: 03-15-1946  Transition of Care [facility]) CM/SW Contact:    Marquita Palms, LCSW Phone Number: 01/18/2023, 3:29 PM  Clinical Narrative:                  CSW contacted Gavin Pound with admissions at [facility]. Gavin Pound reported that patient is now long term with the facility and upon discharge she will return. CSW explained that we will call to report discharge. No other needs at this time.       Patient Goals and CMS Choice            Expected Discharge Plan and Services                                              Prior Living Arrangements/Services                       Activities of Daily Living      Permission Sought/Granted                  Emotional Assessment              Admission diagnosis:  UTI (urinary tract infection) [N39.0] Acute pulmonary embolism with acute cor pulmonale (HCC) [I26.09] Patient Active Problem List   Diagnosis Date Noted   UTI (urinary tract infection) 01/17/2023   Hypernatremia 01/17/2023   Hypokalemia 01/17/2023   Elevated d-dimer 01/17/2023   Elevated troponin 01/17/2023   Acute pulmonary embolism with acute cor pulmonale (HCC) 01/17/2023   Closed right hip fracture (HCC) 12/07/2022   Dementia with behavioral disturbance (HCC) 12/07/2022   Anxiety and depression 12/07/2022   Protein-calorie malnutrition, severe 12/07/2022   Closed left hip fracture (HCC) 09/20/2018   PCP:  [facility] Pharmacy:   [facility] - [place], [place] - [address] [place] [address] Phone: [phone] Fax: [phone]     Social Determinants of Health (SDOH) Social History: SDOH Screenings   Food Insecurity: No Food Insecurity (06/12/2022)   Received from [facility], [facility]  Transportation Needs: No  Transportation Needs (06/12/2022)   Received from [facility], [facility]  Utilities: Not At Risk (06/12/2022)   Received from [facility], [facility]  Financial Resource Strain: Low Risk  (06/12/2022)   Received from [facility], [facility]  Physical Activity: Insufficiently Active (06/12/2022)   Received from [facility], [facility]  Social Connections: Moderately Integrated (06/12/2022)   Received from [facility], [facility]  Stress: Patient Unable To Answer (06/12/2022)   Received from [facility], [facility]  Tobacco Use: High Risk (01/17/2023)   SDOH Interventions:     Readmission Risk Interventions    12/13/2022   10:11 AM  Readmission Risk Prevention Plan  Transportation Screening Complete  PCP or Specialist Appt within 3-5 Days Complete  HRI or Home Care Consult Complete  Social Work Consult for Recovery Care Planning/Counseling Complete  Palliative Care Screening Not Applicable  Medication Review Oceanographer) Referral to Pharmacy

## 2023-01-18 NOTE — Consult Note (Addendum)
Consultation Note Date: 01/18/2023 at 1245  Patient Name: Kaylee Robinson  DOB: 20-May-1945  MRN: [id number]  Age / Sex: 77 y.o., female  PCP: [facility] Referring Physician: Marcelino Duster, MD  HPI/Patient Profile: 77 y.o. 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 intake for the last 2 to 3 days.  Workup in the ED to date has revealed CT of head unremarkable, UA remarkable for possible UTI, CT angiogram of chest with PE, and ultrasound of lower extremities with multiple DVTs.  Patient is being treated with heparin gtt.  PMT was consulted to discuss goals of care.   Clinical Assessment and Goals of Care: Extensive chart review completed prior to meeting patient including labs, vital signs, imaging, progress notes, orders, and available advanced directive documents from current and previous encounters. I then met with patient at bedside to discuss diagnosis prognosis, GOC, EOL wishes, disposition and options.  Patient was sleeping but awakened easily.  She acknowledges my presence, makes eye contact, but is unable to make her wishes known.  Her speech is garbled.  She is able to confirm that she is not experiencing pain or discomfort at this time.    She denies being hungry.  When asked where she is, she says she is at home.  When I shared that she was in the hospital, patient had a blank stare and made no comment.  Patient is able to move all extremities to command.  She has great difficulty with moving her feet as she has foot drop in both legs.  No signs of pain or discomfort noted during my physical examination.  No family or friends present during my visit.  I counseled with dayshift RN and SLP in regards to plan of care.  Patient was  able to tolerate nectar thickened dysphagia 1 diet but Mouth closed and refused it often.  Patient has not had any nonverbal signs of pain unless she is moved.  Discussed that patient has as needed Tylenol for pain management when appropriate.  No adjustment to [facility] needed at this time.  After assessing the patient, I attempted to speak with her legal guardian Kaylee Robinson over the phone. I was nable to leave a voicemail with PMT callback info as "mailbox is full and cannot accept any messages at this time".   PMT will continue to follow and support patient throughout her hospitalization.  I will attempt to speak with legal guardian at a later date and time to initiate goals of care discussions.  Addendum 1600: I was called to bedside by RN as patient's DSS guardian was at bedside.  Patient's guardian is not Kaylee Robinson as indicated in chart but rather Kaylee Robinson. She provided documentation for guardianship and contact inofo was updated.   I discussed patient's chronic condition of dementia as well as acute condition of PE and DVT.  Education provided on heparin, IVF, and medical  support.  Discussed that dementia is a progressive, chronic, and irreversible disease that is often exacerbated by acute illnesses and hospitalizations. Encouraged Kaylee to consider DNR/DNI status understanding evidenced based poor outcomes in similar hospitalized patients, as the cause of the arrest is likely associated with chronic/terminal disease rather than a reversible acute cardio-pulmonary event.     Kaylee shares she would discuss it with her supervisor as well as patient's son.  She shares she gets input from family but that ultimately would be her supervisors decision.  She provided an email address for documentation if DNR is what attending recommends:  [email or web address]  She also requested to speak with attending.  Message sent to attending to speak with guardian.  Full code and  full scope remain.  PMT will continue to follow and support patient throughout her hospitalization.  Primary Decision Maker LEGAL GUARDIAN  Physical Exam Vitals reviewed.  Constitutional:      General: She is not in acute distress.    Appearance: She is normal weight.  HENT:     Head: Normocephalic.     Mouth/Throat:     Mouth: Mucous membranes are moist.  Cardiovascular:     Pulses: Normal pulses.  Pulmonary:     Effort: Pulmonary effort is normal.  Abdominal:     Palpations: Abdomen is soft.  Musculoskeletal:     Comments: Generalized weakness, MAETC  Skin:    General: Skin is warm and dry.     Coloration: Skin is pale.  Neurological:     Mental Status: She is alert.     Comments: Oriented to self  Psychiatric:        Mood and Affect: Mood normal.        Behavior: Behavior normal.     Palliative Assessment/Data: 30%     Thank you for this consult. Palliative medicine will continue to follow and assist holistically.   Time Total: 75 minutes  Time spent includes: Detailed review of medical records (labs, imaging, vital signs), medically appropriate exam (mental status, respiratory, cardiac, skin), discussed with treatment team, counseling and educating patient, family and staff, documenting clinical information, medication management and coordination of care.  Signed by: Georgiann Cocker, DNP, FNP-BC Palliative Medicine   Please contact Palliative Medicine Team providers via [facility] for questions and concerns.

## 2023-01-18 NOTE — ED Notes (Signed)
Patient assisted with meal tray. Patient ate approx 5 bites of meal and approx 2 oz of thickened iced tea. Patient noted to have to be reminded to swallow food in between bites. Mouth clear upon finishing eating.

## 2023-01-18 NOTE — Progress Notes (Signed)
S2 heard, no murmurs, rubs, no pedal edema. Abdomen-soft, nontender, nondistended. Neuro - Alert, does not follow commands, unable to do full neuroexam Skin - Warm and dry.  Data  Reviewed:      Latest Ref Rng & Units 01/18/2023    4:45 AM 01/17/2023    1:52 PM 12/12/2022    5:59 AM  CBC  WBC 4.0 - 10.5 K/uL 13.7  16.5  9.1   Hemoglobin 12.0 - 15.0 g/dL 16.0  10.9  9.7   Hematocrit 36.0 - 46.0 % 36.4  44.0  29.1   Platelets 150 - 400 K/uL 212  252  326       Latest Ref Rng & Units 01/18/2023    4:45 AM 01/17/2023    8:25 PM 01/17/2023    1:52 PM  BMP  Glucose 70 - 99 mg/dL 86   99   BUN 8 - 23 mg/dL 43   53   Creatinine 3.23 - 1.00 mg/dL 5.57   3.22   Sodium 025 - 145 mmol/L 150  140  151   Potassium 3.5 - 5.1 mmol/L 3.2   3.3   Chloride 98 - 111 mmol/L 117   117   CO2 22 - 32 mmol/L 24   24   Calcium 8.9 - 10.3 mg/dL 8.3   8.5    US Venous Img Lower Bilateral (DVT)  Result Date: 01/18/2023 CLINICAL DATA:  Known pulmonary emboli. EXAM: Bilateral LOWER EXTREMITY VENOUS DOPPLER ULTRASOUND TECHNIQUE: Gray-scale sonography with compression, as well as color and duplex ultrasound, were performed to evaluate the deep venous system(s) from the level of the common femoral vein through the popliteal and proximal calf veins. COMPARISON:  None Available. FINDINGS: VENOUS There are several areas of poor compression and limited flow on color and spectral Doppler. This includes the left proximal femoral vein as well as the left posterior tibial and peroneal veins below-the-knee. There is preserved appearance with compression and color Doppler and spectral Doppler of the left common femoral and popliteal. On the right side there is incomplete compression of the proximal, mid and distal femoral vein with echogenic material and thrombus. This has near occlusive. More normal appearance of the right common femoral, popliteal vessels. There is some potential thrombus as well in the peroneal. OTHER None. Limitations: none IMPRESSION: Bilateral areas of DVT including bilateral femoral veins and calf veins. Electronically Signed   By: Karen Kays M.D.   On: 01/18/2023 10:39    ECHOCARDIOGRAM COMPLETE  Result Date: 01/18/2023    ECHOCARDIOGRAM REPORT   Patient Name:   CYNAI VALERA Date of Exam: 01/17/2023 Medical Rec #:  [id number]      Height:       66.0 in Accession #:    [id number]     Weight:       105.8 lb Date of Birth:  [birth year]      BSA:          1.526 m Patient Age:    77 years       BP:           122/78 mmHg Patient Gender: F              HR:           88 bpm. Exam Location:  [facility] Procedure: 2D Echo, Cardiac Doppler and Color Doppler Indications:     Elevated Troponin  History:         Patient has no prior history of Echocardiogram examinations.  Progress Note   Patient: Delaney Seele ZOX:[id number] DOB: 04-Nov-1945 DOA: 01/17/2023     1 DOS: the patient was seen and examined on 01/18/2023   Brief hospital course: Leandrea Scagnelli is a 77 y.o. female with medical history significant of dementia, hypertension, last admission 12/07/2022 for hip fracture presented to the emergency department for evaluation of altered mental status.  Patient is white documented resident has been more confused for past 2 to 3 days.  Patient has been not eating well as per ED provider.  During my exam she has repetitive mumbling speech and does not answer my questions.  Patient has advanced dementia but her mental status has worsened from baseline per nursing staff.   In the emergency department, her vital signs are stable, laboratory workup showed sodium of 151, potassium 3.3, BUN 53, creatinine 0.74, BNP 814, troponin of 677, lactic acid 1.5, white count 16.5, D-dimer 3.06.  UA remarkable for possible UTI, CT head unremarkable.  Patient was given IV fluids, IV Rocephin, heparin infusion in the emergency department.  ED physician requested hospitalist admission for further management evaluation of metabolic encephalopathy secondary to possible UTI, elevated troponin in the setting of infection.  Assessment and Plan: Submassive left lower lobe PE CTA chest shows Multiple occlusive pulmonary emboli in the left lower lobe. CT evidence of right heart strain (RV/LV Ratio = 1.9) consistent with at least submassive (intermediate risk) PE.  Continue heparin drip per weight based pharmacy protocol for next 48 hrs before transition to oral Eliquis therapy. Elevated troponin and BNP in the setting of pulmonary embolism. Echo reviewed EF 60-65% shows no RV strain. Pulmonary evaluation called who recommended no acute intervention at this time, continue anticoagulation. Continue to monitor her vitals, saturation closely.   Acute metabolic encephalopathy: Possible worsening  dementia in the setting of PE, electrolyte abnormalities, poor oral intake, dehydration. CT scan head unremarkable. Continue neurochecks. Delirium precautions. Fall, aspiration precautions.   Hypernatremia- In the setting of poor oral intake, dehydration Gentle IV fluids D5 1/2 NS with patient supplementation Slow correction of sodium. Telemetry monitoring. Trend sodium closely.   Hypokalemia- K 3.2 today. IV fluids with potassium supplementation Monitor daily electrolytes.   Leukocytosis- Due to possible UTI vs dehydration. Continue IV Rocephin therapy. She probably has hemoconcentration. Monitor daily CBC. Follow culture results.   Poor oral intake Failure to thrive: Moderate malnutrition: She has been eating poorly due to advanced dementia. Her overall prognosis is poor. Speech and swallow evaluation done advised dys 1 diet. High risk for aspiration. Dietitian evaluation.    Palliative team trying to reach family for goals of care discussion. Out of bed to chair. Incentive spirometry. Nursing supportive care. Fall, aspiration precautions. DVT prophylaxis   Code Status: Full Code  Subjective: Patient is seen and examined today morning. She is lying in bed. Echo tech at bedside. She is unable to answer me appropriately. Continues to talk which makes no sense.  Physical Exam: Vitals:   01/18/23 1200 01/18/23 1230 01/18/23 1300 01/18/23 1304  BP: 102/74 123/67 97/64   Pulse: 86 88 84   Resp:   15   Temp:    98.6 F (37 C)  TempSrc:    Axillary  SpO2: 96% 93% 97%   Weight:      Height:        General - Elderly thin built ill female, mumbling, does not answer questions HEENT - PERRLA, EOMI, atraumatic head, non tender sinuses. Lung - Clear, rales, rhonchi, wheezes. Heart - S1,  Progress Note   Patient: Delaney Seele ZOX:[id number] DOB: 04-Nov-1945 DOA: 01/17/2023     1 DOS: the patient was seen and examined on 01/18/2023   Brief hospital course: Leandrea Scagnelli is a 77 y.o. female with medical history significant of dementia, hypertension, last admission 12/07/2022 for hip fracture presented to the emergency department for evaluation of altered mental status.  Patient is white documented resident has been more confused for past 2 to 3 days.  Patient has been not eating well as per ED provider.  During my exam she has repetitive mumbling speech and does not answer my questions.  Patient has advanced dementia but her mental status has worsened from baseline per nursing staff.   In the emergency department, her vital signs are stable, laboratory workup showed sodium of 151, potassium 3.3, BUN 53, creatinine 0.74, BNP 814, troponin of 677, lactic acid 1.5, white count 16.5, D-dimer 3.06.  UA remarkable for possible UTI, CT head unremarkable.  Patient was given IV fluids, IV Rocephin, heparin infusion in the emergency department.  ED physician requested hospitalist admission for further management evaluation of metabolic encephalopathy secondary to possible UTI, elevated troponin in the setting of infection.  Assessment and Plan: Submassive left lower lobe PE CTA chest shows Multiple occlusive pulmonary emboli in the left lower lobe. CT evidence of right heart strain (RV/LV Ratio = 1.9) consistent with at least submassive (intermediate risk) PE.  Continue heparin drip per weight based pharmacy protocol for next 48 hrs before transition to oral Eliquis therapy. Elevated troponin and BNP in the setting of pulmonary embolism. Echo reviewed EF 60-65% shows no RV strain. Pulmonary evaluation called who recommended no acute intervention at this time, continue anticoagulation. Continue to monitor her vitals, saturation closely.   Acute metabolic encephalopathy: Possible worsening  dementia in the setting of PE, electrolyte abnormalities, poor oral intake, dehydration. CT scan head unremarkable. Continue neurochecks. Delirium precautions. Fall, aspiration precautions.   Hypernatremia- In the setting of poor oral intake, dehydration Gentle IV fluids D5 1/2 NS with patient supplementation Slow correction of sodium. Telemetry monitoring. Trend sodium closely.   Hypokalemia- K 3.2 today. IV fluids with potassium supplementation Monitor daily electrolytes.   Leukocytosis- Due to possible UTI vs dehydration. Continue IV Rocephin therapy. She probably has hemoconcentration. Monitor daily CBC. Follow culture results.   Poor oral intake Failure to thrive: Moderate malnutrition: She has been eating poorly due to advanced dementia. Her overall prognosis is poor. Speech and swallow evaluation done advised dys 1 diet. High risk for aspiration. Dietitian evaluation.    Palliative team trying to reach family for goals of care discussion. Out of bed to chair. Incentive spirometry. Nursing supportive care. Fall, aspiration precautions. DVT prophylaxis   Code Status: Full Code  Subjective: Patient is seen and examined today morning. She is lying in bed. Echo tech at bedside. She is unable to answer me appropriately. Continues to talk which makes no sense.  Physical Exam: Vitals:   01/18/23 1200 01/18/23 1230 01/18/23 1300 01/18/23 1304  BP: 102/74 123/67 97/64   Pulse: 86 88 84   Resp:   15   Temp:    98.6 F (37 C)  TempSrc:    Axillary  SpO2: 96% 93% 97%   Weight:      Height:        General - Elderly thin built ill female, mumbling, does not answer questions HEENT - PERRLA, EOMI, atraumatic head, non tender sinuses. Lung - Clear, rales, rhonchi, wheezes. Heart - S1,  Progress Note   Patient: Delaney Seele ZOX:[id number] DOB: 04-Nov-1945 DOA: 01/17/2023     1 DOS: the patient was seen and examined on 01/18/2023   Brief hospital course: Leandrea Scagnelli is a 77 y.o. female with medical history significant of dementia, hypertension, last admission 12/07/2022 for hip fracture presented to the emergency department for evaluation of altered mental status.  Patient is white documented resident has been more confused for past 2 to 3 days.  Patient has been not eating well as per ED provider.  During my exam she has repetitive mumbling speech and does not answer my questions.  Patient has advanced dementia but her mental status has worsened from baseline per nursing staff.   In the emergency department, her vital signs are stable, laboratory workup showed sodium of 151, potassium 3.3, BUN 53, creatinine 0.74, BNP 814, troponin of 677, lactic acid 1.5, white count 16.5, D-dimer 3.06.  UA remarkable for possible UTI, CT head unremarkable.  Patient was given IV fluids, IV Rocephin, heparin infusion in the emergency department.  ED physician requested hospitalist admission for further management evaluation of metabolic encephalopathy secondary to possible UTI, elevated troponin in the setting of infection.  Assessment and Plan: Submassive left lower lobe PE CTA chest shows Multiple occlusive pulmonary emboli in the left lower lobe. CT evidence of right heart strain (RV/LV Ratio = 1.9) consistent with at least submassive (intermediate risk) PE.  Continue heparin drip per weight based pharmacy protocol for next 48 hrs before transition to oral Eliquis therapy. Elevated troponin and BNP in the setting of pulmonary embolism. Echo reviewed EF 60-65% shows no RV strain. Pulmonary evaluation called who recommended no acute intervention at this time, continue anticoagulation. Continue to monitor her vitals, saturation closely.   Acute metabolic encephalopathy: Possible worsening  dementia in the setting of PE, electrolyte abnormalities, poor oral intake, dehydration. CT scan head unremarkable. Continue neurochecks. Delirium precautions. Fall, aspiration precautions.   Hypernatremia- In the setting of poor oral intake, dehydration Gentle IV fluids D5 1/2 NS with patient supplementation Slow correction of sodium. Telemetry monitoring. Trend sodium closely.   Hypokalemia- K 3.2 today. IV fluids with potassium supplementation Monitor daily electrolytes.   Leukocytosis- Due to possible UTI vs dehydration. Continue IV Rocephin therapy. She probably has hemoconcentration. Monitor daily CBC. Follow culture results.   Poor oral intake Failure to thrive: Moderate malnutrition: She has been eating poorly due to advanced dementia. Her overall prognosis is poor. Speech and swallow evaluation done advised dys 1 diet. High risk for aspiration. Dietitian evaluation.    Palliative team trying to reach family for goals of care discussion. Out of bed to chair. Incentive spirometry. Nursing supportive care. Fall, aspiration precautions. DVT prophylaxis   Code Status: Full Code  Subjective: Patient is seen and examined today morning. She is lying in bed. Echo tech at bedside. She is unable to answer me appropriately. Continues to talk which makes no sense.  Physical Exam: Vitals:   01/18/23 1200 01/18/23 1230 01/18/23 1300 01/18/23 1304  BP: 102/74 123/67 97/64   Pulse: 86 88 84   Resp:   15   Temp:    98.6 F (37 C)  TempSrc:    Axillary  SpO2: 96% 93% 97%   Weight:      Height:        General - Elderly thin built ill female, mumbling, does not answer questions HEENT - PERRLA, EOMI, atraumatic head, non tender sinuses. Lung - Clear, rales, rhonchi, wheezes. Heart - S1,  Progress Note   Patient: Delaney Seele ZOX:[id number] DOB: 04-Nov-1945 DOA: 01/17/2023     1 DOS: the patient was seen and examined on 01/18/2023   Brief hospital course: Leandrea Scagnelli is a 77 y.o. female with medical history significant of dementia, hypertension, last admission 12/07/2022 for hip fracture presented to the emergency department for evaluation of altered mental status.  Patient is white documented resident has been more confused for past 2 to 3 days.  Patient has been not eating well as per ED provider.  During my exam she has repetitive mumbling speech and does not answer my questions.  Patient has advanced dementia but her mental status has worsened from baseline per nursing staff.   In the emergency department, her vital signs are stable, laboratory workup showed sodium of 151, potassium 3.3, BUN 53, creatinine 0.74, BNP 814, troponin of 677, lactic acid 1.5, white count 16.5, D-dimer 3.06.  UA remarkable for possible UTI, CT head unremarkable.  Patient was given IV fluids, IV Rocephin, heparin infusion in the emergency department.  ED physician requested hospitalist admission for further management evaluation of metabolic encephalopathy secondary to possible UTI, elevated troponin in the setting of infection.  Assessment and Plan: Submassive left lower lobe PE CTA chest shows Multiple occlusive pulmonary emboli in the left lower lobe. CT evidence of right heart strain (RV/LV Ratio = 1.9) consistent with at least submassive (intermediate risk) PE.  Continue heparin drip per weight based pharmacy protocol for next 48 hrs before transition to oral Eliquis therapy. Elevated troponin and BNP in the setting of pulmonary embolism. Echo reviewed EF 60-65% shows no RV strain. Pulmonary evaluation called who recommended no acute intervention at this time, continue anticoagulation. Continue to monitor her vitals, saturation closely.   Acute metabolic encephalopathy: Possible worsening  dementia in the setting of PE, electrolyte abnormalities, poor oral intake, dehydration. CT scan head unremarkable. Continue neurochecks. Delirium precautions. Fall, aspiration precautions.   Hypernatremia- In the setting of poor oral intake, dehydration Gentle IV fluids D5 1/2 NS with patient supplementation Slow correction of sodium. Telemetry monitoring. Trend sodium closely.   Hypokalemia- K 3.2 today. IV fluids with potassium supplementation Monitor daily electrolytes.   Leukocytosis- Due to possible UTI vs dehydration. Continue IV Rocephin therapy. She probably has hemoconcentration. Monitor daily CBC. Follow culture results.   Poor oral intake Failure to thrive: Moderate malnutrition: She has been eating poorly due to advanced dementia. Her overall prognosis is poor. Speech and swallow evaluation done advised dys 1 diet. High risk for aspiration. Dietitian evaluation.    Palliative team trying to reach family for goals of care discussion. Out of bed to chair. Incentive spirometry. Nursing supportive care. Fall, aspiration precautions. DVT prophylaxis   Code Status: Full Code  Subjective: Patient is seen and examined today morning. She is lying in bed. Echo tech at bedside. She is unable to answer me appropriately. Continues to talk which makes no sense.  Physical Exam: Vitals:   01/18/23 1200 01/18/23 1230 01/18/23 1300 01/18/23 1304  BP: 102/74 123/67 97/64   Pulse: 86 88 84   Resp:   15   Temp:    98.6 F (37 C)  TempSrc:    Axillary  SpO2: 96% 93% 97%   Weight:      Height:        General - Elderly thin built ill female, mumbling, does not answer questions HEENT - PERRLA, EOMI, atraumatic head, non tender sinuses. Lung - Clear, rales, rhonchi, wheezes. Heart - S1,

## 2023-01-18 NOTE — ED Notes (Signed)
Assisted pt in rolling to change brief, pad and perform peri care. Sacral pad applied for reddened area. Pt boosted in bed.

## 2023-01-18 NOTE — Consult Note (Signed)
PHARMACY - ANTICOAGULATION CONSULT NOTE  Pharmacy Consult for Continuous Heparin Infusion Indication:  NSTEMI  Allergies  Allergen Reactions   Enalapril Other (See Comments)   Naproxen Swelling   Amoxicillin Rash   Sulfa Antibiotics Rash    Patient Measurements: Height: 5\' 6"  (167.6 cm) Weight: 48 kg (105 lb 13.1 oz) IBW/kg (Calculated) : 59.3 Heparin Dosing Weight: 48 kg  Vital Signs: Temp: 98.3 F (36.8 C) (10/31 0105) Temp Source: Oral (10/31 0105) BP: 104/69 (10/31 0800) Pulse Rate: 83 (10/31 0800)  Labs: Recent Labs    01/17/23 1352 01/17/23 1536 01/18/23 0106 01/18/23 0445  HGB 14.0  --   --  11.7*  HCT 44.0  --   --  36.4  PLT 252  --   --  212  APTT 35  --   --   --   LABPROT 16.3*  --   --   --   INR 1.3*  --   --   --   HEPARINUNFRC  --   --  0.24*  --   CREATININE 0.74  --   --  0.57  TROPONINIHS 677* 633*  --   --     Estimated Creatinine Clearance: 44.6 mL/min (by C-G formula based on SCr of 0.57 mg/dL).   Medical History: Past Medical History:  Diagnosis Date   Alzheimer disease (HCC)    Arthritis    bilateral knees   Hypertension    Vascular dementia (HCC)     Medications:  No history of chronic anticoagulation PTA  Assessment: 77 y.o. female with past medical history of hypertension and dementia who presents to the ED for altered mental status.  Per EMS, patient has been residing at [facility] and staff has noted increasing confusion over the past 2 to 3 days. EKG shows no evidence of arrhythmia, nonspecific ST/T abnormality noted. Pharmacy has been consulted for continuous heparin infusion.  Baseline INR 1.3, aPTT 35s, H&H, PLT wnl  Goal of Therapy:  Heparin level 0.3-0.7 units/ml Monitor platelets by anticoagulation protocol: Yes   Plan: heparin level remains subtherapeutic despite recent rate increase ---Give 1500 units IV heparin bolus x 1 ---Increase heparin infusion to 900 units/hr ---Check anti-Xa level in 8 hours  after rate change ---Continue to monitor H&H and platelets  Burnis Medin, PharmD, BCPS 01/18/2023 10:37 AM

## 2023-01-18 NOTE — Consult Note (Signed)
Hospital Consult    Reason for Consult:  Pulmonary Emboli  Requesting Physician:  Dr Marcelino Duster MD.  MRN #:  [id number]  History of Present Illness: This is a 77 y.o. female Deliza Billings is a 77 y.o. female with medical history significant of dementia, hypertension, last admission 12/07/2022 for hip fracture presented to the emergency department for evaluation of altered mental status.  Patient is a [name]  documented resident has been more confused for past 2 to 3 days.    Upon work up patient underwent CTA of the chest which revealed left lower lobe pulmonary emboli with left heart strain (RV/LV) Ratio = 1.9 During my exam she has repetitive mumbling speech and does not answer my questions. Patient has advanced dementia but her mental status has worsened from baseline per nursing staff. Vascular Surgery consulted to evaluate for possible pulmonary thrombectomy.   Past Medical History:  Diagnosis Date   Alzheimer disease (HCC)    Arthritis    bilateral knees   Hypertension    Vascular dementia [facility])     Past Surgical History:  Procedure Laterality Date   HIP ARTHROPLASTY Right 12/07/2022   Procedure: ARTHROPLASTY BIPOLAR HIP (HEMIARTHROPLASTY);  Surgeon: Christena Flake, MD;  Location: [facility] ORS;  Service: Orthopedics;  Laterality: Right;   HIP SURGERY     INTRAMEDULLARY (IM) NAIL INTERTROCHANTERIC Left 09/20/2018   Procedure: INTRAMEDULLARY (IM) NAIL INTERTROCHANTRIC;  Surgeon: Juanell Fairly, MD;  Location: [facility] ORS;  Service: Orthopedics;  Laterality: Left;   TONSILLECTOMY     adnoidectomy    Allergies  Allergen Reactions   Enalapril Other (See Comments)   Naproxen Swelling   Amoxicillin Rash   Sulfa Antibiotics Rash    Prior to Admission medications   Medication Sig Start Date End Date Taking? Authorizing Provider  acetaminophen (TYLENOL) 325 MG tablet Take 650 mg by mouth 3 (three) times daily. 11/21/22  Yes [provider]  alum & mag  hydroxide-simeth (MAALOX/MYLANTA) 200-200-20 MG/5ML suspension Take 30 mLs by mouth every 4 (four) hours as needed for indigestion. 09/23/18  Yes Jimmye Norman, NP  Cyanocobalamin (B-12) 1000 MCG CAPS Take 1 capsule by mouth daily. 09/28/22  Yes [provider]  divalproex (DEPAKOTE) 250 MG DR tablet Take 250 mg by mouth every 8 (eight) hours. 10/23/22  Yes [provider]  docusate sodium (COLACE) 100 MG capsule Take 1 capsule (100 mg total) by mouth 2 (two) times daily. 09/23/18  Yes Jimmye Norman, NP  ferrous sulfate 325 (65 FE) MG tablet Take 1 tablet (325 mg total) by mouth 3 (three) times daily after meals. 09/23/18  Yes Jimmye Norman, NP  haloperidol (HALDOL) 1 MG tablet Take 1 mg by mouth every 12 (twelve) hours as needed for agitation. 10/19/22  Yes [provider]  LORazepam (ATIVAN) 0.5 MG tablet Take 0.5 mg by mouth every 8 (eight) hours as needed for anxiety. 01/11/23  Yes [provider]  memantine (NAMENDA) 10 MG tablet Take 10 mg by mouth 2 (two) times daily. 10/23/22  Yes [provider]  Multiple Vitamin (MULTIVITAMIN WITH MINERALS) TABS tablet Take 1 tablet by mouth daily. 12/14/22  Yes Darlin Priestly, MD  polyethylene glycol (MIRALAX / GLYCOLAX) 17 g packet Take 17 g by mouth daily as needed for mild constipation. 09/23/18  Yes Jimmye Norman, NP  sertraline (ZOLOFT) 25 MG tablet Take 25 mg by mouth daily. 01/11/23  Yes [provider]  sertraline (ZOLOFT) 50 MG tablet Take 50 mg  by mouth daily. 09/12/18  Yes [provider]  traZODone (DESYREL) 50 MG tablet Take 50 mg by mouth at bedtime. 10/23/22  Yes [provider]  VITAMIN D-1000 MAX ST 25 MCG (1000 UT) tablet Take 1,000 Units by mouth daily. 09/28/22  Yes [provider]  feeding supplement (ENSURE ENLIVE / ENSURE PLUS) LIQD Take 237 mLs by mouth 3 (three) times daily between meals. 12/13/22   Darlin Priestly, MD  HYDROcodone-acetaminophen  (NORCO/VICODIN) 5-325 MG tablet Take 1 tablet by mouth every 6 (six) hours as needed for moderate pain. Patient not taking: Reported on 01/17/2023 12/11/22   Anson Oregon, PA-C  nicotine (NICODERM CQ - DOSED IN MG/24 HR) 7 mg/24hr patch Place 1 patch (7 mg total) onto the skin daily. Patient not taking: Reported on 01/17/2023 12/14/22   Darlin Priestly, MD  QUEtiapine (SEROQUEL) 50 MG tablet Take 1 tablet (50 mg total) by mouth at bedtime as needed (Agitation). Patient not taking: Reported on 01/17/2023 12/13/22   Darlin Priestly, MD    Social History   Socioeconomic History   Marital status: Married    Spouse name: Not on file   Number of children: Not on file   Years of education: Not on file   Highest education level: Not on file  Occupational History   Not on file  Tobacco Use   Smoking status: Some Days    Current packs/day: 1.00    Types: Cigarettes   Smokeless tobacco: Never  Substance and Sexual Activity   Alcohol use: No   Drug use: Never   Sexual activity: Not Currently  Other Topics Concern   Not on file  Social History Narrative   Not on file   Social Determinants of Health   Financial Resource Strain: Low Risk  (06/12/2022)   Received from [facility], [facility]   Overall Financial Resource Strain (CARDIA)    Difficulty of Paying Living Expenses: Not very hard  Food Insecurity: No Food Insecurity (06/12/2022)   Received from [facility], [facility]   Hunger Vital Sign    Worried About Running Out of Food in the Last Year: Never true    Ran Out of Food in the Last Year: Never true  Transportation Needs: No Transportation Needs (06/12/2022)   Received from [facility], [facility]   PRAPARE - Transportation    Lack of Transportation (Medical): No    Lack of Transportation (Non-Medical): No  Physical Activity: Insufficiently Active (06/12/2022)   Received from [facility], [facility]   Exercise Vital Sign    Days of Exercise per Week: 5 days     Minutes of Exercise per Session: 10 min  Stress: Patient Unable To Answer (06/12/2022)   Received from [facility], [facility] - Occupational Stress Questionnaire    Feeling of Stress : Patient unable to answer  Social Connections: Moderately Integrated (06/12/2022)   Received from [facility], [facility]   Social Network    How would you rate your social network (family, work, friends)?: Adequate participation with social networks  Intimate Partner Violence: Not At Risk (06/12/2022)   Received from [facility], [facility]   HITS    Over the last 12 months how often did your partner physically hurt you?: 1    Over the last 12 months how often did your partner insult you or talk down to you?: 1    Over the last 12 months how often did your  partner threaten you with physical harm?: 1    Over the last 12 months how often did your partner scream or curse at you?: 1     Family History  Problem Relation Age of Onset   Breast cancer Neg Hx     ROS: Otherwise negative unless mentioned in HPI  Physical Examination  Vitals:   01/18/23 1304 01/18/23 1500  BP:  101/76  Pulse:  82  Resp:    Temp: 98.6 F (37 C)   SpO2:  97%   Body mass index is 17.08 kg/m.  General:  WDWN in NAD Gait: Not observed HENT: WNL, normocephalic Pulmonary: normal non-labored breathing, without Rales, rhonchi,  wheezing Cardiac: regular, Tachycardia in 100's, without  Murmurs, rubs or gallops; without carotid bruits Abdomen: Positive bowel sounds, soft, NT/ND, no masses Skin: without rashes Vascular Exam/Pulses: Palpable Pulses throughout.  Extremities: without ischemic changes, without Gangrene , without cellulitis; without open wounds;  Musculoskeletal: no muscle wasting or atrophy  Neurologic: A&O X 0. Patient with severe dementia and Alzheimer's  Psychiatric:  The pt has Abnormal- Confused  affect Lymph:  Unremarkable  CBC    Component  Value Date/Time   WBC 13.7 (H) 01/18/2023 0445   RBC 3.87 01/18/2023 0445   HGB 11.7 (L) 01/18/2023 0445   HGB 17.0 (H) 05/14/2013 0951   HCT 36.4 01/18/2023 0445   HCT 50.2 (H) 05/14/2013 0951   PLT 212 01/18/2023 0445   PLT 312 05/14/2013 0951   MCV 94.1 01/18/2023 0445   MCV 91 05/14/2013 0951   MCH 30.2 01/18/2023 0445   MCHC 32.1 01/18/2023 0445   RDW 13.6 01/18/2023 0445   RDW 13.3 05/14/2013 0951   LYMPHSABS 2.6 01/17/2023 1352   LYMPHSABS 0.7 (L) 05/14/2013 0951   MONOABS 1.4 (H) 01/17/2023 1352   MONOABS 0.4 05/14/2013 0951   EOSABS 0.0 01/17/2023 1352   EOSABS 0.0 05/14/2013 0951   BASOSABS 0.1 01/17/2023 1352   BASOSABS 0.0 05/14/2013 0951    BMET    Component Value Date/Time   NA 150 (H) 01/18/2023 0445   NA 136 05/14/2013 0951   K 3.2 (L) 01/18/2023 0445   K 4.4 05/14/2013 0951   CL 117 (H) 01/18/2023 0445   CL 105 05/14/2013 0951   CO2 24 01/18/2023 0445   CO2 28 05/14/2013 0951   GLUCOSE 86 01/18/2023 0445   GLUCOSE 103 (H) 05/14/2013 0951   BUN 43 (H) 01/18/2023 0445   BUN 22 (H) 05/14/2013 0951   CREATININE 0.57 01/18/2023 0445   CREATININE 1.37 (H) 05/14/2013 0951   CALCIUM 8.3 (L) 01/18/2023 0445   CALCIUM 9.5 05/14/2013 0951   GFRNONAA >60 01/18/2023 0445   GFRNONAA 40 (L) 05/14/2013 0951   GFRAA >60 09/23/2018 0517   GFRAA 46 (L) 05/14/2013 0951    COAGS: Lab Results  Component Value Date   INR 1.3 (H) 01/17/2023   INR 1.1 12/07/2022   INR 1.1 09/19/2018     Non-Invasive Vascular Imaging:   EXAM:01/17/23 CT ANGIOGRAPHY CHEST WITH CONTRAST   TECHNIQUE: Multidetector CT imaging of the chest was performed using the standard protocol during bolus administration of intravenous contrast. Multiplanar CT image reconstructions and MIPs were obtained to evaluate the vascular anatomy.   RADIATION DOSE REDUCTION: This exam was performed according to the departmental dose-optimization program which includes automated exposure control,  adjustment of the mA and/or kV according to patient size and/or use of iterative reconstruction technique.   CONTRAST:  52mL OMNIPAQUE IOHEXOL 350 MG/ML  SOLN   COMPARISON:  12/25/2011   FINDINGS: Cardiovascular: Filling defects noted within the left lower lobe pulmonary arteries compatible with pulmonary emboli. Evidence of right heart strain with RV/LV ratio of 1.9. No evidence of aortic aneurysm. Scattered aortic atherosclerosis and moderate coronary artery calcifications.   Mediastinum/Nodes: No mediastinal, hilar, or axillary adenopathy. Trachea and esophagus are unremarkable. Thyroid unremarkable.   Lungs/Pleura: No confluent airspace opacities or effusions.   Upper Abdomen: No acute findings in the upper abdomen.   Musculoskeletal: Chest wall soft tissues are unremarkable. No acute bony abnormality.   Review of the MIP images confirms the above findings.   IMPRESSION: Multiple occlusive pulmonary emboli in the left lower lobe. CT evidence of right heart strain (RV/LV Ratio = 1.9) consistent with at least submassive (intermediate risk) PE. The presence of right heart strain has been associated with an increased risk of morbidity and mortality. Please refer to the "Code PE Focused" order set in EPIC.   Coronary artery disease.   Aortic Atherosclerosis (ICD10-I70.0).  Statin:  No. Beta Blocker:  No. Aspirin:  No. ACEI:  No. ARB:  No. CCB use:  No Other antiplatelets/anticoagulants:  No.    ASSESSMENT/PLAN: This is a 77 y.o. female Who presents to [facility] Emergency department for increased confusion over the past 2-3 days. Upon work up patient noted to have left lower lung pulmonary embolism with heart strain RV/LV ratio of 1.9.   PLAN: Vascular Surgery recommends a pulmonary thrombectomy be performed due to clot burden and right heart strain. However patient has advanced dementia with Alzheimer's. Patient currently can not speak in clear sentences. She only mumbles  and can not follow commands or answer questions.   Patient also has a legal guardian which I attempted to call and leave a message to discuss the patients care and possible procedure but her mailbox was full. I was then redirected to Ms Koleen Nimrod of [place] [employer] and I was able to leave a message to discuss patients care. I also attempted to contact the patient son Makali Nordine but was only able to leave a message on the phone number listed.   At this time vascular surgery recommends keeping the patient on a heparin infusion for the best anticoagulation of her pulmonary embolism. We will continue to attempt to speak with [place] [employer] for discussion of care. If the legal guardian at DSS does not wish to have the patient undergo any invasive procedures due to advanced disease the plan for only anticoagulation would be medically reasonable. Patient would then need to be started on Eliquis 10 mg twice daily for 7 days and then changed to 5 mg twice daily indefinitely.    -I discussed the plan with Dr Festus Barren MD and he agrees with the plan.    Marcie Bal [facility] 01/18/2023 3:49 PM

## 2023-01-18 NOTE — ED Notes (Addendum)
Dr. Aundria Rud at bedside. MD stated that if pt's HR elevates or their map drops below 65 to let them know.

## 2023-01-18 NOTE — Consult Note (Signed)
NAME:  Kaylee Robinson, MRN:  [id number], DOB:  04/05/1945, LOS: 1 ADMISSION DATE:  01/17/2023, CONSULTATION DATE:  01/18/2023 REFERRING MD:  Marcelino Duster, MD, CHIEF COMPLAINT:  Pulmonary Embolism   History of Present Illness:   Patient is a 77 year old female with history of dementia, presenting to the hospital with worsening altered mental status and found to have pulmonary embolism. She is admitted to the medicine service and pulmonary is consulted to help guide management of VTE.  Patient was recently admitted to the hospital in September following a fall with hip fracture. She underwent right hip bipolar hemiarthroplasty on 12/07/2022 and was discharged to SNF on 12/13/2022. She comes in now with increased confusion of 2-3 days in duration and decreased PO intake. Workup included elevated d-dimer, BNP, and troponin. She underwent a CT scan of the chest that noted a pulmonary embolism for which she is being admitted.  Patient is confused at the time of my evaluation and is unable to provide any history. Most of the history is obtained from chart review. She reports pain all over.  Pertinent  Medical History  -Severe Dementia   Objective   Blood pressure 104/69, pulse 83, temperature 98.3 F (36.8 C), temperature source Oral, resp. rate 18, height 5\' 6"  (1.676 m), weight 48 kg, SpO2 95%.        Intake/Output Summary (Last 24 hours) at 01/18/2023 0810 Last data filed at 01/18/2023 0204 Gross per 24 hour  Intake 1180.67 ml  Output --  Net 1180.67 ml   Filed Weights   01/17/23 1334  Weight: 48 kg    Examination: Physical Exam Vitals reviewed.  Constitutional:      General: She is not in acute distress.    Appearance: She is ill-appearing.  Cardiovascular:     Rate and Rhythm: Normal rate and regular rhythm.     Pulses: Normal pulses.     Heart sounds: Normal heart sounds.  Pulmonary:     Effort: Pulmonary effort is normal.     Breath sounds: Normal breath sounds.   Musculoskeletal:     Right lower leg: No edema.     Left lower leg: No edema.  Neurological:     Mental Status: She is disoriented.     Assessment & Plan:   #Acute Hypoxic Respiratory Failure #Pulmonary Embolism #DVT  Patient presents with worsening mental status and was found to have acute pulmonary embolism. This is likely provoked following her recent hip replacement. On presentation, troponin and BNP were both elevated, but there are no signs of RV dilation or strain on CT or on Echocardiography. Patient admitted to the medical service for further management. Would recommend continued anticoagulation and subsequently switching to DOAC, with Apixaban preferred. Duration for provoked PE is 3 months, though suspect her VTE was also driven by immobility. Would recommend at least 3 months of anti-coagulation, and consideration of extending to 6 months if the patient remains immobile.  -continue with telemetry -continue IV heparin, consider switch to lovenox if kidney function improves -switch to DOAC prior to discharge -will need repeat TTE in 6 months -duration of anti-coagulation for 3 to 6 months, recommend latter -age appropriate cancer screening  Maleyah Evans, MD [facility] 01/18/2023 2:02 PM    Labs   CBC: Recent Labs  Lab 01/17/23 1352 01/18/23 0445  WBC 16.5* 13.7*  NEUTROABS 12.3*  --   HGB 14.0 11.7*  HCT 44.0 36.4  MCV 95.0 94.1  PLT 252 212  Basic Metabolic Panel: Recent Labs  Lab 01/17/23 1352 01/17/23 2025 01/18/23 0445  NA 151* 140 150*  K 3.3*  --  3.2*  CL 117*  --  117*  CO2 24  --  24  GLUCOSE 99  --  86  BUN 53*  --  43*  CREATININE 0.74  --  0.57  CALCIUM 8.5*  --  8.3*  MG 2.2  --   --    GFR: Estimated Creatinine Clearance: 44.6 mL/min (by C-G formula based on SCr of 0.57 mg/dL). Recent Labs  Lab 01/17/23 1352 01/17/23 1425 01/17/23 2317 01/18/23 0445  PROCALCITON 0.11  --   --   --   WBC 16.5*  --   --   13.7*  LATICACIDVEN  --  1.5 1.1  --     Liver Function Tests: Recent Labs  Lab 01/17/23 1352  AST 37  ALT 18  ALKPHOS 80  BILITOT 1.7*  PROT 6.0*  ALBUMIN 3.3*   No results for input(s): "LIPASE", "AMYLASE" in the last 168 hours. No results for input(s): "AMMONIA" in the last 168 hours.  ABG No results found for: "PHART", "PCO2ART", "PO2ART", "HCO3", "TCO2", "ACIDBASEDEF", "O2SAT"   Coagulation Profile: Recent Labs  Lab 01/17/23 1352  INR 1.3*    Cardiac Enzymes: No results for input(s): "CKTOTAL", "CKMB", "CKMBINDEX", "TROPONINI" in the last 168 hours.  HbA1C: No results found for: "HGBA1C"  CBG: No results for input(s): "GLUCAP" in the last 168 hours.  Review of Systems:   Unable to obtain  Past Medical History:  She,  has a past medical history of Alzheimer disease (HCC), Arthritis, Hypertension, and Vascular dementia (HCC).   Surgical History:   Past Surgical History:  Procedure Laterality Date   HIP ARTHROPLASTY Right 12/07/2022   Procedure: ARTHROPLASTY BIPOLAR HIP (HEMIARTHROPLASTY);  Surgeon: Christena Flake, MD;  Location: [facility] ORS;  Service: Orthopedics;  Laterality: Right;   HIP SURGERY     INTRAMEDULLARY (IM) NAIL INTERTROCHANTERIC Left 09/20/2018   Procedure: INTRAMEDULLARY (IM) NAIL INTERTROCHANTRIC;  Surgeon: Juanell Fairly, MD;  Location: [facility] ORS;  Service: Orthopedics;  Laterality: Left;   TONSILLECTOMY     adnoidectomy     Social History:   reports that she has been smoking cigarettes. She has never used smokeless tobacco. She reports that she does not drink alcohol and does not use drugs.   Family History:  Her family history is negative for Breast cancer.   Allergies Allergies  Allergen Reactions   Enalapril Other (See Comments)   Naproxen Swelling   Amoxicillin Rash   Sulfa Antibiotics Rash     Home Medications  Prior to Admission medications   Medication Sig Start Date End Date Taking? Authorizing Provider   acetaminophen (TYLENOL) 325 MG tablet Take 650 mg by mouth 3 (three) times daily. 11/21/22  Yes [provider]  alum & mag hydroxide-simeth (MAALOX/MYLANTA) 200-200-20 MG/5ML suspension Take 30 mLs by mouth every 4 (four) hours as needed for indigestion. 09/23/18  Yes Jimmye Norman, NP  Cyanocobalamin (B-12) 1000 MCG CAPS Take 1 capsule by mouth daily. 09/28/22  Yes [provider]  divalproex (DEPAKOTE) 250 MG DR tablet Take 250 mg by mouth every 8 (eight) hours. 10/23/22  Yes [provider]  docusate sodium (COLACE) 100 MG capsule Take 1 capsule (100 mg total) by mouth 2 (two) times daily. 09/23/18  Yes Jimmye Norman, NP  ferrous sulfate 325 (65 FE) MG tablet Take 1 tablet (325 mg total) by mouth 3 (  three) times daily after meals. 09/23/18  Yes Jimmye Norman, NP  haloperidol (HALDOL) 1 MG tablet Take 1 mg by mouth every 12 (twelve) hours as needed for agitation. 10/19/22  Yes [provider]  LORazepam (ATIVAN) 0.5 MG tablet Take 0.5 mg by mouth every 8 (eight) hours as needed for anxiety. 01/11/23  Yes [provider]  memantine (NAMENDA) 10 MG tablet Take 10 mg by mouth 2 (two) times daily. 10/23/22  Yes [provider]  Multiple Vitamin (MULTIVITAMIN WITH MINERALS) TABS tablet Take 1 tablet by mouth daily. 12/14/22  Yes Darlin Priestly, MD  polyethylene glycol (MIRALAX / GLYCOLAX) 17 g packet Take 17 g by mouth daily as needed for mild constipation. 09/23/18  Yes Jimmye Norman, NP  sertraline (ZOLOFT) 25 MG tablet Take 25 mg by mouth daily. 01/11/23  Yes [provider]  sertraline (ZOLOFT) 50 MG tablet Take 50 mg by mouth daily. 09/12/18  Yes [provider]  traZODone (DESYREL) 50 MG tablet Take 50 mg by mouth at bedtime. 10/23/22  Yes [provider]  VITAMIN D-1000 MAX ST 25 MCG (1000 UT) tablet Take 1,000 Units by mouth daily. 09/28/22  Yes [provider]  feeding supplement (ENSURE  ENLIVE / ENSURE PLUS) LIQD Take 237 mLs by mouth 3 (three) times daily between meals. 12/13/22   Darlin Priestly, MD  HYDROcodone-acetaminophen (NORCO/VICODIN) 5-325 MG tablet Take 1 tablet by mouth every 6 (six) hours as needed for moderate pain. Patient not taking: Reported on 01/17/2023 12/11/22   Anson Oregon, PA-C  nicotine (NICODERM CQ - DOSED IN MG/24 HR) 7 mg/24hr patch Place 1 patch (7 mg total) onto the skin daily. Patient not taking: Reported on 01/17/2023 12/14/22   Darlin Priestly, MD  QUEtiapine (SEROQUEL) 50 MG tablet Take 1 tablet (50 mg total) by mouth at bedtime as needed (Agitation). Patient not taking: Reported on 01/17/2023 12/13/22   Darlin Priestly, MD     I spent 60 minutes caring for this patient today, including preparing to see the patient, obtaining a medical history , reviewing a separately obtained history, performing a medically appropriate examination and/or evaluation, counseling and educating the patient/family/caregiver, documenting clinical information in the electronic health record, and independently interpreting results (not separately reported/billed) and communicating results to the patient/family/caregiver

## 2023-01-18 NOTE — ED Notes (Signed)
US at bedside

## 2023-01-18 NOTE — Evaluation (Addendum)
Clinical/Bedside Swallow Evaluation Patient Details  Name: Kaylee Robinson MRN: [id number] Date of Birth: Dec 17, 1945  Today's Date: 01/18/2023 Time: SLP Start Time (ACUTE ONLY): 0905 SLP Stop Time (ACUTE ONLY): 1000 SLP Time Calculation (min) (ACUTE ONLY): 55 min  Past Medical History:  Past Medical History:  Diagnosis Date   Alzheimer disease (HCC)    Arthritis    bilateral knees   Hypertension    Vascular dementia [facility])    Past Surgical History:  Past Surgical History:  Procedure Laterality Date   HIP ARTHROPLASTY Right 12/07/2022   Procedure: ARTHROPLASTY BIPOLAR HIP (HEMIARTHROPLASTY);  Surgeon: Christena Flake, MD;  Location: [facility] ORS;  Service: Orthopedics;  Laterality: Right;   HIP SURGERY     INTRAMEDULLARY (IM) NAIL INTERTROCHANTERIC Left 09/20/2018   Procedure: INTRAMEDULLARY (IM) NAIL INTERTROCHANTRIC;  Surgeon: Juanell Fairly, MD;  Location: [facility] ORS;  Service: Orthopedics;  Laterality: Left;   TONSILLECTOMY     adnoidectomy   HPI:  Pt is a 77 y.o. female with past medical history of hypertension and dementia who presents to the ED for altered mental status.  Per EMS, patient has been residing at [facility] and staff has noted increasing confusion over the past 2 to 3 days.  During this time, patient has not been able to take any of her medications and has not eaten or drink anything.  They have not noticed any vomiting but she has been unable to follow commands with repetitive mumbling speech.  Patient does have advanced dementia and is reportedly difficult to understand at baseline.  Staff reported to EMS that patient had elevated sodium and they have been giving IV fluids at the facility due to concern for dehydration.  Pt had a recent admit last month from NH for R hip and wrist pain.    Assessment / Plan / Recommendation  Clinical Impression   Pt seen for BSE this morning. Pt resting in bed; eyes closed. Alerted to verbal/tactile stim; speech mumbled,  incomprehensible except to state "i love you"; "give me a kiss". This seems Baseline per chart notes at other contacts/by family. Alert to name only. She did not follow instructions w/ cue. On RA, afebrile. WBC trending down.  Pt appears to present w/ oropharyngeal phase dysphagia in setting of declined Cognitive status; Baseline Severe Dementia per MD note. She has not been eating/drinking recently at [facility] per chart. ANY Cognitive decline can impact overall awareness/timing of swallow and safety during po tasks which increases risk for aspiration, choking as well as challenges overall oral intake/nutrition.  Pt appears at increased risk for aspiration/aspiration pneumonia currently. Her risk for aspiration can be reduced when following aspiration precautions, given feeding assistance, and using a modified diet consistency. She is Edentulous currently.  She required MOD++ verbal/visual/tactile cues for follow through during po tasks and oral care.        Pt consumed several trials of ice chips, purees, and Nectar liquids via TSP w/ No overt, clinical s/s of aspiration noted: no decline in vocal quality, no cough, and no decline in respiratory status during/post trials. O2 sats remained 96%. Oral phase was c/b deficits in overall awareness of boluses orally/during oral prep(to take the po trial), in timely bolus management, and achieving full oral clearing of the puree boluses -- alternating w/ a TSP of Nectar liquid appeared to aid oral clearing. No trials of thin liquids nor solids were given d/t the Cognitive decline and reduced oral awareness.  OM Exam was cursory w/ generalized oral  weakness but No overt unilateral lingual weakness noted w/ licking lips bilaterally. Mod Confusion of OM tasks and oral care noted. Attempted hand over hand guidance for self-feeding but pt would not attempt to hold cup and c/o Pain in LUE(NSG alerted).          In setting of her presentation and her risk for aspiration,  recommend initiation of the dysphagia level 1diet(PUREE foods) moistened for ease of oral phase/manipulation; Nectar liquids by TSP. Strict aspiration precautions; pt must be alert and engage in oral intake/swallowing for po's or HOLD po's. Reduce Distractions during meals and engage pt for self-feeding as able. Check for oral clearing at meals. Pills Crushed in Puree for safer swallowing. Supervision/Support w/ feeding at meals as needed. MD/NSG updated.  ST services will monitor pt's status while admitted for appropriateness for diet upgrade -- suspect she may be close to/at her Baseline and would greatly benefit from a dysphagia diet for safety and overall oral intake. Recommend follow w/ Palliative Care for GOC and education re: impact of Cognitive decline/Dementia on swallowing and overall oral intake. Precautions posted in room. SLP Visit Diagnosis: Dysphagia, oropharyngeal phase (R13.12) (baseline Dementia)    Aspiration Risk  Mild aspiration risk;Moderate aspiration risk;Risk for inadequate nutrition/hydration    Diet Recommendation   Nectar;Dysphagia 1 (puree) (when fully engaged) = dysphagia level 1diet(PUREE foods) moistened for ease of oral phase/manipulation; Nectar liquids by TSP. Strict aspiration precautions; pt must be alert and engage in oral intake/swallowing for po's or HOLD po's. Reduce Distractions during meals and engage pt for self-feeding as able. Check for oral clearing at meals. Supervision/Support w/ feeding at meals.  Medication Administration: Crushed with puree    Other  Recommendations Recommended Consults:  (Dietician; Palliative Care f/u) Oral Care Recommendations: Oral care BID;Oral care before and after PO;Staff/trained caregiver to provide oral care Caregiver Recommendations: Avoid jello, ice cream, thin soups, popsicles;Remove water pitcher;Have oral suction available    Recommendations for follow up therapy are one component of a multi-disciplinary discharge  planning process, led by the attending physician.  Recommendations may be updated based on patient status, additional functional criteria and insurance authorization.  Follow up Recommendations Follow physician's recommendations for discharge plan and follow up therapies      Assistance Recommended at Discharge  FULL  Functional Status Assessment Patient has had a recent decline in their functional status and/or demonstrates limited ability to make significant improvements in function in a reasonable and predictable amount of time  Frequency and Duration min 2x/week  2 weeks       Prognosis Prognosis for improved oropharyngeal function: Guarded Barriers to Reach Goals: Cognitive deficits;Language deficits;Time post onset;Severity of deficits;Behavior Barriers/Prognosis Comment: baseline Severe Dementia      Swallow Study   General Date of Onset: 01/17/23 HPI: Pt is a 77 y.o. female with past medical history of hypertension and dementia who presents to the ED for altered mental status.  Per EMS, patient has been residing at [facility] and staff has noted increasing confusion over the past 2 to 3 days.  During this time, patient has not been able to take any of her medications and has not eaten or drink anything.  They have not noticed any vomiting but she has been unable to follow commands with repetitive mumbling speech.  Patient does have advanced dementia and is reportedly difficult to understand at baseline.  Staff reported to EMS that patient had elevated sodium and they have been giving IV fluids at the facility  due to concern for dehydration.  Pt had a recent admit last month from NH for R hip and wrist pain. Type of Study: Bedside Swallow Evaluation Previous Swallow Assessment: none Diet Prior to this Study: NPO Temperature Spikes Noted: No (wbc 13.7 trending down) Respiratory Status: Room air History of Recent Intubation: No Behavior/Cognition: Alert;Cooperative;Pleasant  mood;Confused;Distractible;Requires cueing (mumbled speech - baseline per chart) Oral Cavity Assessment: Dry Oral Care Completed by SLP: Yes (attempted) Oral Cavity - Dentition: Edentulous Vision:  (n/a) Self-Feeding Abilities: Total assist Patient Positioning: Upright in bed (total assist) Baseline Vocal Quality: Low vocal intensity (mumbled) Volitional Cough: Cognitively unable to elicit Volitional Swallow: Unable to elicit    Oral/Motor/Sensory Function Overall Oral Motor/Sensory Function: Generalized oral weakness (but no unilateral weakness)   Ice Chips Ice chips: Impaired Presentation: Spoon (fed; 2 trials) Oral Phase Impairments: Reduced labial seal;Reduced lingual movement/coordination;Poor awareness of bolus Oral Phase Functional Implications: Prolonged oral transit Pharyngeal Phase Impairments:  (none)   Thin Liquid Thin Liquid: Not tested    Nectar Thick Nectar Thick Liquid: Impaired Presentation: Spoon (fed; 10 trials) Oral Phase Impairments: Reduced labial seal;Reduced lingual movement/coordination;Poor awareness of bolus Oral phase functional implications: Prolonged oral transit Pharyngeal Phase Impairments:  (none)   Honey Thick Honey Thick Liquid: Not tested   Puree Puree: Impaired Presentation: Spoon (fed; 9 trials) Oral Phase Impairments: Reduced labial seal;Reduced lingual movement/coordination;Poor awareness of bolus Oral Phase Functional Implications: Prolonged oral transit;Oral holding Pharyngeal Phase Impairments:  (none)   Solid     Solid: Not tested        Jerilynn Som, MS, CCC-SLP Speech Language Pathologist Rehab Services; [facility] [phone] (ascom)' Saavi Mceachron 01/18/2023,12:21 PM

## 2023-01-18 NOTE — ED Notes (Signed)
Dietary called at this time for thick-in liquids. Dietary to send.

## 2023-01-18 NOTE — Consult Note (Signed)
PHARMACY - ANTICOAGULATION CONSULT NOTE  Pharmacy Consult for Continuous Heparin Infusion Indication:  NSTEMI  Allergies  Allergen Reactions   Enalapril Other (See Comments)   Naproxen Swelling   Amoxicillin Rash   Sulfa Antibiotics Rash    Patient Measurements: Height: 5\' 6"  (167.6 cm) Weight: 48 kg (105 lb 13.1 oz) IBW/kg (Calculated) : 59.3 Heparin Dosing Weight: 48 kg  Vital Signs: Temp: 98.3 F (36.8 C) (10/31 0105) Temp Source: Oral (10/31 0105) BP: 102/65 (10/31 0030) Pulse Rate: 88 (10/31 0030)  Labs: Recent Labs    01/17/23 1352 01/17/23 1536 01/18/23 0106  HGB 14.0  --   --   HCT 44.0  --   --   PLT 252  --   --   APTT 35  --   --   LABPROT 16.3*  --   --   INR 1.3*  --   --   HEPARINUNFRC  --   --  0.24*  CREATININE 0.74  --   --   TROPONINIHS 677* 633*  --     Estimated Creatinine Clearance: 44.6 mL/min (by C-G formula based on SCr of 0.74 mg/dL).   Medical History: Past Medical History:  Diagnosis Date   Alzheimer disease (HCC)    Arthritis    bilateral knees   Hypertension    Vascular dementia (HCC)     Medications:  No history of chronic anticoagulation PTA  Assessment: 77 y.o. female with past medical history of hypertension and dementia who presents to the ED for altered mental status.  Per EMS, patient has been residing at [facility] and staff has noted increasing confusion over the past 2 to 3 days. EKG shows no evidence of arrhythmia, nonspecific ST/T abnormality noted. Pharmacy has been consulted for continuous heparin infusion.  Baseline labs have been ordered and are pending.  Goal of Therapy:  Heparin level 0.3-0.7 units/ml Monitor platelets by anticoagulation protocol: Yes   10/31 0106 HL 0.24, subtherapeutic  Plan:  Give 700 units bolus x 1 Increase heparin infusion to 700 units/hr Check anti-Xa level in 8 hours after rate change Continue to monitor H&H and platelets  Otelia Sergeant, PharmD,  [facility] 01/18/2023 2:07 AM

## 2023-01-19 ENCOUNTER — Other Ambulatory Visit: Payer: Self-pay

## 2023-01-19 ENCOUNTER — Encounter: Payer: Self-pay | Admitting: Internal Medicine

## 2023-01-19 DIAGNOSIS — E876 Hypokalemia: Secondary | ICD-10-CM | POA: Diagnosis not present

## 2023-01-19 DIAGNOSIS — I2609 Other pulmonary embolism with acute cor pulmonale: Secondary | ICD-10-CM | POA: Diagnosis not present

## 2023-01-19 DIAGNOSIS — F03918 Unspecified dementia, unspecified severity, with other behavioral disturbance: Secondary | ICD-10-CM | POA: Diagnosis not present

## 2023-01-19 DIAGNOSIS — I214 Non-ST elevation (NSTEMI) myocardial infarction: Secondary | ICD-10-CM | POA: Diagnosis not present

## 2023-01-19 DIAGNOSIS — R9431 Abnormal electrocardiogram [ECG] [EKG]: Secondary | ICD-10-CM

## 2023-01-19 DIAGNOSIS — R7989 Other specified abnormal findings of blood chemistry: Secondary | ICD-10-CM | POA: Diagnosis not present

## 2023-01-19 DIAGNOSIS — E870 Hyperosmolality and hypernatremia: Secondary | ICD-10-CM | POA: Diagnosis not present

## 2023-01-19 LAB — HEPARIN LEVEL (UNFRACTIONATED)
Heparin Unfractionated: 0.31 [IU]/mL (ref 0.30–0.70)
Heparin Unfractionated: 0.43 [IU]/mL (ref 0.30–0.70)

## 2023-01-19 LAB — BASIC METABOLIC PANEL
Anion gap: 8 (ref 5–15)
BUN: 31 mg/dL — ABNORMAL HIGH (ref 8–23)
CO2: 24 mmol/L (ref 22–32)
Calcium: 7.7 mg/dL — ABNORMAL LOW (ref 8.9–10.3)
Chloride: 115 mmol/L — ABNORMAL HIGH (ref 98–111)
Creatinine, Ser: 0.41 mg/dL — ABNORMAL LOW (ref 0.44–1.00)
GFR, Estimated: >60 mL/min (ref >60)
Glucose, Bld: 121 mg/dL — ABNORMAL HIGH (ref 70–99)
Potassium: 3 mmol/L — ABNORMAL LOW (ref 3.5–5.1)
Sodium: 147 mmol/L — ABNORMAL HIGH (ref 135–145)

## 2023-01-19 LAB — CBC
HCT: 34.7 % — ABNORMAL LOW (ref 36.0–46.0)
Hemoglobin: 11 g/dL — ABNORMAL LOW (ref 12.0–15.0)
MCH: 30.4 pg (ref 26.0–34.0)
MCHC: 31.7 g/dL (ref 30.0–36.0)
MCV: 95.9 fL (ref 80.0–100.0)
Platelets: 202 10*3/uL (ref 150–400)
RBC: 3.62 MIL/uL — ABNORMAL LOW (ref 3.87–5.11)
RDW: 13.6 % (ref 11.5–15.5)
WBC: 10.6 10*3/uL — ABNORMAL HIGH (ref 4.0–10.5)
nRBC: 0 % (ref 0.0–0.2)

## 2023-01-19 MED ORDER — KCL IN DEXTROSE-NACL 20-5-0.45 MEQ/L-%-% IV SOLN
INTRAVENOUS | Status: DC
  Filled 2023-01-19 (×3): qty 1000

## 2023-01-19 MED ORDER — POTASSIUM CHLORIDE 20 MEQ PO PACK
40.0000 meq | PACK | Freq: Two times a day (BID) | ORAL | Status: DC
  Administered 2023-01-19 – 2023-01-23 (×7): 40 meq via ORAL
  Filled 2023-01-19 (×8): qty 2

## 2023-01-19 MED ORDER — ENSURE ENLIVE PO LIQD
237.0000 mL | Freq: Three times a day (TID) | ORAL | Status: DC
  Administered 2023-01-19 – 2023-01-22 (×8): 237 mL via ORAL

## 2023-01-19 MED ORDER — ADULT MULTIVITAMIN W/MINERALS CH
1.0000 | ORAL_TABLET | Freq: Every day | ORAL | Status: DC
  Administered 2023-01-20 – 2023-01-23 (×4): 1 via ORAL
  Filled 2023-01-19 (×4): qty 1

## 2023-01-19 NOTE — Progress Notes (Addendum)
Progress Note   [name] ZOX:[id number] DOB: June 23, 1945 DOA: 01/17/2023     2 DOS: the patient was seen and examined on 01/19/2023   Brief hospital course: Kaylee Robinson is a 77 y.o. female with medical history significant of dementia, hypertension, last admission 12/07/2022 for hip fracture presented to the emergency department for evaluation of altered mental status.  Patient is white documented resident has been more confused for past 2 to 3 days.  Patient has been not eating well as per ED provider.  During my exam she has repetitive mumbling speech and does not answer my questions.  Patient has advanced dementia but her mental status has worsened from baseline per nursing staff.   In the emergency department, her vital signs are stable, laboratory workup showed sodium of 151, potassium 3.3, BUN 53, creatinine 0.74, BNP 814, troponin of 677, lactic acid 1.5, white count 16.5, D-dimer 3.06.  UA remarkable for possible UTI, CT head unremarkable.  Patient was given IV fluids, IV Rocephin, heparin infusion in the emergency department.  ED physician requested hospitalist admission for further management evaluation of metabolic encephalopathy secondary to possible UTI, elevated troponin in the setting of infection.  Assessment and Plan: Submassive left lower lobe PE CTA chest shows Multiple occlusive pulmonary emboli in the left lower lobe. CT evidence of right heart strain (RV/LV Ratio = 1.9) consistent with at least submassive (intermediate risk) PE.  Continue heparin drip per weight based pharmacy protocol for now with pending decision of pulmonary thrombectomy per vascular. Elevated troponin and BNP in the setting of pulmonary embolism. Echo reviewed EF 60-65% shows no RV strain. Pulmonary evaluation appreciated, no acute intervention at this time, continue anticoagulation. Vascular surgery recommends heparin infusion until discussion with [place] DSS regarding possible pulmonary  thrombectomy. Continue to monitor her vitals, saturation closely.  Down grade to medical telemetry floor.  Acute metabolic encephalopathy: Possible worsening dementia in the setting of PE, electrolyte abnormalities, poor oral intake, dehydration. CT scan head unremarkable. Baseline severe dementia, SNF resident. Continue neurochecks. Delirium precautions. Fall, aspiration precautions.   Hypernatremia- In the setting of poor oral intake, dehydration Gentle IV fluids D5 1/2 NS with potassium supplementation Slow correction of sodium. Telemetry monitoring. Trend sodium closely.   Hypokalemia- K 3.0 today. IV fluids with potassium supplementation, oral potassium ordered. Monitor daily electrolytes.   Leukocytosis- Improving. Continue IV Rocephin therapy. She probably has hemoconcentration. Monitor daily CBC. Follow blood/ urine culture results.   Poor oral intake Failure to thrive: Severe malnutrition: Poor oral intake due to advanced dementia. Her overall prognosis is poor. Speech and swallow evaluation done advised dys 1 diet. She is at high risk for aspiration. Dietitian evaluation.  Goals of care: Palliative team and myself discussed with DSS. Advised to make her DNR, given her current clinical condition, poor prognosis and multiple comorbid conditions.  Continue nursing supportive care. Fall, aspiration precautions. DVT prophylaxis - heparin drip   Code Status: Full Code  Subjective: Patient is seen and examined today morning. She is sleeping comfortably. Eating poor, no overnight issues.  Physical Exam: Vitals:   01/19/23 0730 01/19/23 0924 01/19/23 1000 01/19/23 1100  BP: 98/74  110/70 105/70  Pulse: 75  75 77  Resp: (!) 23  (!) 21 19  Temp:  98.4 F (36.9 C)    TempSrc:  Oral    SpO2: 95%  97% 98%  Weight:      Height:        General - Elderly thin built ill female,  mumbling, does not answer questions HEENT - PERRLA, EOMI, atraumatic head, non tender  sinuses. Lung - Clear, rales, rhonchi, wheezes. Heart - S1, S2 heard, no murmurs, rubs, no pedal edema. Abdomen-soft, nontender, nondistended. Neuro - sleeping, unable to do full neuroexam Skin - Warm and dry.  Data Reviewed:      Latest Ref Rng & Units 01/19/2023    3:54 AM 01/18/2023    4:45 AM 01/17/2023    1:52 PM  CBC  WBC 4.0 - 10.5 K/uL 10.6  13.7  16.5   Hemoglobin 12.0 - 15.0 g/dL 40.9  81.1  91.4   Hematocrit 36.0 - 46.0 % 34.7  36.4  44.0   Platelets 150 - 400 K/uL 202  212  252       Latest Ref Rng & Units 01/19/2023    3:54 AM 01/18/2023    6:11 PM 01/18/2023    4:45 AM  BMP  Glucose 70 - 99 mg/dL 782  956  86   BUN 8 - 23 mg/dL 31  40  43   Creatinine 0.44 - 1.00 mg/dL 2.13  0.86  5.78   Sodium 135 - 145 mmol/L 147  149  150   Potassium 3.5 - 5.1 mmol/L 3.0  3.0  3.2   Chloride 98 - 111 mmol/L 115  117  117   CO2 22 - 32 mmol/L 24  24  24    Calcium 8.9 - 10.3 mg/dL 7.7  8.1  8.3    US Venous Img Lower Bilateral (DVT)  Result Date: 01/18/2023 CLINICAL DATA:  Known pulmonary emboli. EXAM: Bilateral LOWER EXTREMITY VENOUS DOPPLER ULTRASOUND TECHNIQUE: Gray-scale sonography with compression, as well as color and duplex ultrasound, were performed to evaluate the deep venous system(s) from the level of the common femoral vein through the popliteal and proximal calf veins. COMPARISON:  None Available. FINDINGS: VENOUS There are several areas of poor compression and limited flow on color and spectral Doppler. This includes the left proximal femoral vein as well as the left posterior tibial and peroneal veins below-the-knee. There is preserved appearance with compression and color Doppler and spectral Doppler of the left common femoral and popliteal. On the right side there is incomplete compression of the proximal, mid and distal femoral vein with echogenic material and thrombus. This has near occlusive. More normal appearance of the right common femoral, popliteal vessels.  There is some potential thrombus as well in the peroneal. OTHER None. Limitations: none IMPRESSION: Bilateral areas of DVT including bilateral femoral veins and calf veins. Electronically Signed   By: Karen Kays M.D.   On: 01/18/2023 10:39   ECHOCARDIOGRAM COMPLETE  Result Date: 01/18/2023    ECHOCARDIOGRAM REPORT   Patient Name:   Kaylee Robinson Date of Exam: 01/17/2023 Medical Rec #:  [id number]      Height:       66.0 in Accession #:    [id number]     Weight:       105.8 lb Date of Birth:  [birth year]      BSA:          1.526 m Patient Age:    77 years       BP:           122/78 mmHg Patient Gender: F              HR:           88 bpm. Exam Location:  [facility] Procedure: 2D Echo, Cardiac Doppler and Color  Doppler Indications:     Elevated Troponin  History:         Patient has no prior history of Echocardiogram examinations.                  Risk Factors:Hypertension.  Sonographer:     Daphine Deutscher RDCS Referring Phys:  [id number] [facility] Diagnosing Phys: Mellody Drown Alluri IMPRESSIONS  1. Left ventricular ejection fraction, by estimation, is 60 to 65%. The left ventricle has normal function. The left ventricle has no regional wall motion abnormalities. Left ventricular diastolic parameters are consistent with Grade I diastolic dysfunction (impaired relaxation).  2. Right ventricular systolic function is normal. The right ventricular size is normal.  3. The mitral valve is normal in structure. Trivial mitral valve regurgitation.  4. Tricuspid valve regurgitation is mild to moderate.  5. The aortic valve is tricuspid. There is mild thickening of the aortic valve. Aortic valve regurgitation is not visualized.  6. The inferior vena cava is normal in size with greater than 50% respiratory variability, suggesting right atrial pressure of 3 mmHg. FINDINGS  Left Ventricle: Left ventricular ejection fraction, by estimation, is 60 to 65%. The left ventricle has normal function. The left ventricle has no  regional wall motion abnormalities. The left ventricular internal cavity size was normal in size. There is  no left ventricular hypertrophy. Left ventricular diastolic parameters are consistent with Grade I diastolic dysfunction (impaired relaxation). Right Ventricle: The right ventricular size is normal. No increase in right ventricular wall thickness. Right ventricular systolic function is normal. Left Atrium: Left atrial size was normal in size. Right Atrium: Right atrial size was normal in size. Pericardium: Trivial pericardial effusion is present. Mitral Valve: The mitral valve is normal in structure. Trivial mitral valve regurgitation. Tricuspid Valve: The tricuspid valve is normal in structure. Tricuspid valve regurgitation is mild to moderate. Aortic Valve: The aortic valve is tricuspid. There is mild thickening of the aortic valve. Aortic valve regurgitation is not visualized. Pulmonic Valve: The pulmonic valve was not well visualized. Pulmonic valve regurgitation is trivial. Aorta: The aortic root is normal in size and structure. Venous: The inferior vena cava is normal in size with greater than 50% respiratory variability, suggesting right atrial pressure of 3 mmHg. IAS/Shunts: No atrial level shunt detected by color flow Doppler.  LEFT VENTRICLE PLAX 2D LVIDd:         3.80 cm   Diastology LVIDs:         2.00 cm   LV e' medial:    5.83 cm/s LV PW:         0.90 cm   LV E/e' medial:  6.0 LV IVS:        0.90 cm   LV e' lateral:   7.31 cm/s LVOT diam:     1.90 cm   LV E/e' lateral: 4.8 LV SV:         48 LV SV Index:   31 LVOT Area:     2.84 cm  RIGHT VENTRICLE          IVC RV Basal diam:  2.80 cm  IVC diam: 1.20 cm LEFT ATRIUM             Index        RIGHT ATRIUM          Index LA diam:        3.50 cm 2.29 cm/m   RA Area:     5.77 cm LA Vol (A2C):  24.5 ml 16.06 ml/m  RA Volume:   8.89 ml  5.83 ml/m LA Vol (A4C):   28.7 ml 18.81 ml/m LA Biplane Vol: 26.6 ml 17.43 ml/m  AORTIC VALVE LVOT Vmax:    110.00 cm/s LVOT Vmean:  68.550 cm/s LVOT VTI:    0.168 m  AORTA Ao Root diam: 3.20 cm MITRAL VALVE               TRICUSPID VALVE MV Area (PHT): 3.98 cm    TR Peak grad:   29.8 mmHg MV Decel Time: 191 msec    TR Vmax:        273.00 cm/s MV E velocity: 35.00 cm/s MV A velocity: 87.00 cm/s  SHUNTS MV E/A ratio:  0.40        Systemic VTI:  0.17 m                            Systemic Diam: 1.90 cm Windell Norfolk Electronically signed by Windell Norfolk Signature Date/Time: 01/18/2023/10:23:59 AM    Final    CT Angio Chest Pulmonary Embolism (PE) W or WO Contrast  Result Date: 01/17/2023 CLINICAL DATA:  Pulmonary embolism (PE) suspected, high prob. Altered mental status EXAM: CT ANGIOGRAPHY CHEST WITH CONTRAST TECHNIQUE: Multidetector CT imaging of the chest was performed using the standard protocol during bolus administration of intravenous contrast. Multiplanar CT image reconstructions and MIPs were obtained to evaluate the vascular anatomy. RADIATION DOSE REDUCTION: This exam was performed according to the departmental dose-optimization program which includes automated exposure control, adjustment of the mA and/or kV according to patient size and/or use of iterative reconstruction technique. CONTRAST:  52mL OMNIPAQUE IOHEXOL 350 MG/ML SOLN COMPARISON:  12/25/2011 FINDINGS: Cardiovascular: Filling defects noted within the left lower lobe pulmonary arteries compatible with pulmonary emboli. Evidence of right heart strain with RV/LV ratio of 1.9. No evidence of aortic aneurysm. Scattered aortic atherosclerosis and moderate coronary artery calcifications. Mediastinum/Nodes: No mediastinal, hilar, or axillary adenopathy. Trachea and esophagus are unremarkable. Thyroid unremarkable. Lungs/Pleura: No confluent airspace opacities or effusions. Upper Abdomen: No acute findings in the upper abdomen. Musculoskeletal: Chest wall soft tissues are unremarkable. No acute bony abnormality. Review of the MIP images confirms the  above findings. IMPRESSION: Multiple occlusive pulmonary emboli in the left lower lobe. CT evidence of right heart strain (RV/LV Ratio = 1.9) consistent with at least submassive (intermediate risk) PE. The presence of right heart strain has been associated with an increased risk of morbidity and mortality. Please refer to the "Code PE Focused" order set in EPIC. Coronary artery disease. Aortic Atherosclerosis (ICD10-I70.0). These results will be called to the ordering clinician or representative by the Radiologist Assistant, and communication documented in the PACS or [employer]. Electronically Signed   By: Charlett Nose M.D.   On: 01/17/2023 20:29   DG Chest Portable 1 View  Result Date: 01/17/2023 CLINICAL DATA:  77 year old female with altered mental status and weakness. EXAM: PORTABLE CHEST 1 VIEW COMPARISON:  Portable chest 12/06/2022 and earlier. FINDINGS: Portable AP upright view at 1434 hours. Large lung volumes. Stable cardiac size and mediastinal contours. Tortuous descending thoracic aorta. Visualized tracheal air column is within normal limits. Allowing for portable technique the lungs are clear. No pneumothorax or pleural effusion. No acute osseous abnormality identified. Negative visible bowel gas. IMPRESSION: Chronic hyperinflation.  No acute cardiopulmonary abnormality. Electronically Signed   By: Odessa Fleming M.D.   On: 01/17/2023 16:29   CT Head Wo  Contrast  Result Date: 01/17/2023 CLINICAL DATA:  Altered mental status EXAM: CT HEAD WITHOUT CONTRAST TECHNIQUE: Contiguous axial images were obtained from the base of the skull through the vertex without intravenous contrast. RADIATION DOSE REDUCTION: This exam was performed according to the departmental dose-optimization program which includes automated exposure control, adjustment of the mA and/or kV according to patient size and/or use of iterative reconstruction technique. COMPARISON:  11/20/2022 FINDINGS: Brain: No evidence of acute  infarction, hemorrhage, mass, mass effect, or midline shift. No hydrocephalus or extra-axial fluid collection. Periventricular white matter changes, likely the sequela of chronic small vessel ischemic disease. Age related cerebral atrophy. Vascular: No hyperdense vessel. Atherosclerotic calcifications in the intracranial carotid and vertebral arteries. Skull: Negative for fracture or focal lesion. Sinuses/Orbits: No acute finding. Other: The mastoid air cells are well aerated. IMPRESSION: No acute intracranial process. Electronically Signed   By: Wiliam Ke M.D.   On: 01/17/2023 15:43     Family Communication: discussed with DSS, Palliative regarding goals of care yesterday. Continue ongoing discussion with DSS.  Disposition: Status is: Inpatient Remains inpatient appropriate because: management of PE, electrolyte abnormalities.  Planned Discharge Destination: Skilled nursing facility     MDM level 3 - Patient has submassive PE, elevated trop, BNP now on heparin drip, need close hemodynamic, telemetry, electrolyte monitoring. She is at high risk for sudden clinical deterioration.  Author: Marcelino Duster, MD 01/19/2023 11:37 AM Secure chat 7am to 7pm For on call review [email or web address].

## 2023-01-19 NOTE — Progress Notes (Signed)
Progress Note    01/19/2023 3:05 PM * No surgery found *  Subjective:  This is a 77 y.o. female Kaylee Robinson is a 77 y.o. female with medical history significant of dementia, hypertension, last admission 12/07/2022 for hip fracture presented to the emergency department for evaluation of altered mental status.  Patient is a [name]  documented resident has been more confused for past 2 to 3 days.     Upon work up patient underwent CTA of the chest which revealed left lower lobe pulmonary emboli with left heart strain (RV/LV) Ratio = 1.9 During my exam she has repetitive mumbling speech and does not answer my questions. Patient has advanced dementia but her mental status has worsened from baseline per nursing staff. Vascular Surgery consulted to evaluate for possible pulmonary thrombectomy.   On exam this afternoon the patient remains confused and unable to have a conversation, answer questions or follow commands.    Vitals:   01/19/23 1200 01/19/23 1319  BP: 113/68 104/74  Pulse: 76 83  Resp: 13 18  Temp:  99.2 F (37.3 C)  SpO2: 97% 99%   Physical Exam: Cardiac:  RRR and tachycardic. No Murmur  Lungs:  Normal Respiratory effort. No rales or rhonchi Incisions:  None Extremities:  Palpable pulses throughout Abdomen:  Positive bowel sounds, soft, non tender and non distended.  Neurologic: Dementia and advanced Alzheimer's. Can not converse or follow commands.   CBC    Component Value Date/Time   WBC 10.6 (H) 01/19/2023 0354   RBC 3.62 (L) 01/19/2023 0354   HGB 11.0 (L) 01/19/2023 0354   HGB 17.0 (H) 05/14/2013 0951   HCT 34.7 (L) 01/19/2023 0354   HCT 50.2 (H) 05/14/2013 0951   PLT 202 01/19/2023 0354   PLT 312 05/14/2013 0951   MCV 95.9 01/19/2023 0354   MCV 91 05/14/2013 0951   MCH 30.4 01/19/2023 0354   MCHC 31.7 01/19/2023 0354   RDW 13.6 01/19/2023 0354   RDW 13.3 05/14/2013 0951   LYMPHSABS 2.6 01/17/2023 1352   LYMPHSABS 0.7 (L) 05/14/2013 0951   MONOABS 1.4  (H) 01/17/2023 1352   MONOABS 0.4 05/14/2013 0951   EOSABS 0.0 01/17/2023 1352   EOSABS 0.0 05/14/2013 0951   BASOSABS 0.1 01/17/2023 1352   BASOSABS 0.0 05/14/2013 0951    BMET    Component Value Date/Time   NA 147 (H) 01/19/2023 0354   NA 136 05/14/2013 0951   K 3.0 (L) 01/19/2023 0354   K 4.4 05/14/2013 0951   CL 115 (H) 01/19/2023 0354   CL 105 05/14/2013 0951   CO2 24 01/19/2023 0354   CO2 28 05/14/2013 0951   GLUCOSE 121 (H) 01/19/2023 0354   GLUCOSE 103 (H) 05/14/2013 0951   BUN 31 (H) 01/19/2023 0354   BUN 22 (H) 05/14/2013 0951   CREATININE 0.41 (L) 01/19/2023 0354   CREATININE 1.37 (H) 05/14/2013 0951   CALCIUM 7.7 (L) 01/19/2023 0354   CALCIUM 9.5 05/14/2013 0951   GFRNONAA >60 01/19/2023 0354   GFRNONAA 40 (L) 05/14/2013 0951   GFRAA >60 09/23/2018 0517   GFRAA 46 (L) 05/14/2013 0951    INR    Component Value Date/Time   INR 1.3 (H) 01/17/2023 1352    No intake or output data in the 24 hours ending 01/19/23 1505   Assessment/Plan:  77 y.o. female with diagnosed pulmonary embolism  * No surgery found *   PLAN: [place] DSS has been in contact with palliative care team and the  Attending Dr Marcelino Duster to determine a plan of care for the patient. Vascular surgery recommends continuing anticoagulation at this time.    DVT prophylaxis:  Heparin Infusion   Marcie Bal [facility] 01/19/2023 3:05 PM

## 2023-01-19 NOTE — Progress Notes (Signed)
Initial Nutrition Assessment  DOCUMENTATION CODES:   Underweight  INTERVENTION:   -Nepro Shake po TID, each supplement provides 425 kcal and 19 grams protein  -Magic cup TID with meals, each supplement provides 290 kcal and 9 grams of protein  -MVI with minerals daily   NUTRITION DIAGNOSIS:   Inadequate oral intake related to poor appetite as evidenced by per patient/family report.  GOAL:   Patient will meet greater than or equal to 90% of their needs  MONITOR:   PO intake, Supplement acceptance, Diet advancement  REASON FOR ASSESSMENT:   Consult Assessment of nutrition requirement/status, Poor PO  ASSESSMENT:   Pt with medical history significant of dementia, hypertension, last admission 12/07/2022 for hip fracture presented for evaluation of altered mental status.  Pt admitted with submassive lower lobe PE and acute metabolic encephalopathy.   10/31- s/p BSE- dysphagia 1 diet with nectar thick liquids  Reviewed I/O's: +111 ml x 24 hours and +1.3 L since admission   Pt unavailable at time of visit. RD unable to obtain further nutrition-related history or complete nutrition-focused physical exam at this time.    Per ED notes, pt with confusion over the past 2-3 days and not eating well PTA.   Per MD notes, CTA of chest reveals multiple occlusive pulmonary emboli in lt lower lob; plan to continue heparin infusion until contact can be made with DSS regarding potential thrombectomy.   Pt familiar to this RD from prior admission last month. Pt met criteria for severe malnutrition in the context of chronic illness; suspect this is ongoing, but unable to identify at this time.   Case discussed with SLP, who reports pt would benefit from supplements as she suspects pt may not eat much per her evaluation.   Reviewed wt hx; pt has experienced a 12.7% wt loss over the past 2 months, which is significant for time frame.   Medications reviewed and include potassium chloride and  dextrose 5% and 0.45% NaCl with KCl 20 mEq/L infusion @ 100 ml/hr.   Labs reviewed: Na: 147, K:3.0 (inpatient orders for glycemic control are none).    Diet Order:   Diet Order             DIET - DYS 1 Room service appropriate? No; Fluid consistency: Nectar Thick  Diet effective now                   EDUCATION NEEDS:   No education needs have been identified at this time  Skin:  Skin Assessment: Reviewed RN Assessment  Last BM:  01/19/23  Height:   Ht Readings from Last 1 Encounters:  01/17/23 5\' 6"  (1.676 m)    Weight:   Wt Readings from Last 1 Encounters:  01/17/23 48 kg    Ideal Body Weight:  59.1 kg  BMI:  Body mass index is 17.08 kg/m.  Estimated Nutritional Needs:   Kcal:  1500-1700  Protein:  70-85 grams  Fluid:  > 1.5 L    Levada Schilling, RD, LDN, CDCES Registered Dietitian III Certified Diabetes Care and Education Specialist Please refer to [facility] for RD and/or RD on-call/weekend/after hours pager

## 2023-01-19 NOTE — ED Notes (Signed)
Pt wrist band cut off at this time. Pt hands and wrists significantly swollen/edematous.

## 2023-01-19 NOTE — ED Notes (Signed)
Attempted to get pt to drink/eat her thicken liquids. Pt took one sip and refused anymore.

## 2023-01-19 NOTE — Consult Note (Signed)
PHARMACY - ANTICOAGULATION CONSULT NOTE  Pharmacy Consult for Continuous Heparin Infusion Indication:  NSTEMI  Allergies  Allergen Reactions   Enalapril Other (See Comments)   Naproxen Swelling   Amoxicillin Rash   Sulfa Antibiotics Rash    Patient Measurements: Height: 5\' 6"  (167.6 cm) Weight: 48 kg (105 lb 13.1 oz) IBW/kg (Calculated) : 59.3 Heparin Dosing Weight: 48 kg  Vital Signs: Temp: 98.7 F (37.1 C) (10/31 2155) Temp Source: Oral (10/31 2155) BP: 98/66 (10/31 2300) Pulse Rate: 79 (10/31 2300)  Labs: Recent Labs    01/17/23 1352 01/17/23 1536 01/18/23 0106 01/18/23 0445 01/18/23 1250 01/18/23 1811 01/19/23 0013  HGB 14.0  --   --  11.7*  --   --   --   HCT 44.0  --   --  36.4  --   --   --   PLT 252  --   --  212  --   --   --   APTT 35  --   --   --   --   --   --   LABPROT 16.3*  --   --   --   --   --   --   INR 1.3*  --   --   --   --   --   --   HEPARINUNFRC  --   --  0.24*  --  0.18*  --  0.43  CREATININE 0.74  --   --  0.57  --  0.55  --   TROPONINIHS 677* 633*  --   --  417*  --   --     Estimated Creatinine Clearance: 44.6 mL/min (by C-G formula based on SCr of 0.55 mg/dL).   Medical History: Past Medical History:  Diagnosis Date   Alzheimer disease (HCC)    Arthritis    bilateral knees   Hypertension    Vascular dementia (HCC)     Medications:  No history of chronic anticoagulation PTA  Assessment: 77 y.o. female with past medical history of hypertension and dementia who presents to the ED for altered mental status.  Per EMS, patient has been residing at [facility] and staff has noted increasing confusion over the past 2 to 3 days. EKG shows no evidence of arrhythmia, nonspecific ST/T abnormality noted. Pharmacy has been consulted for continuous heparin infusion.  Baseline INR 1.3, aPTT 35s, H&H, PLT wnl  Goal of Therapy:  Heparin level 0.3-0.7 units/ml Monitor platelets by anticoagulation protocol: Yes  11/01 0013 HL  0.43, therapeutic x 1  Plan: ---Continue heparin infusion at 900 units/hr ---Recheck HL in 8 hr to confirm ---Continue to monitor H&H and platelets  Otelia Sergeant, PharmD, [facility] 01/19/2023 1:07 AM

## 2023-01-19 NOTE — Consult Note (Signed)
PHARMACY - ANTICOAGULATION CONSULT NOTE  Pharmacy Consult for Continuous Heparin Infusion Indication:  NSTEMI  Allergies  Allergen Reactions   Enalapril Other (See Comments)   Naproxen Swelling   Amoxicillin Rash   Sulfa Antibiotics Rash    Patient Measurements: Height: 5\' 6"  (167.6 cm) Weight: 48 kg (105 lb 13.1 oz) IBW/kg (Calculated) : 59.3 Heparin Dosing Weight: 48 kg  Vital Signs: Temp: 98.4 F (36.9 C) (11/01 0924) Temp Source: Oral (11/01 0924) BP: 98/74 (11/01 0730) Pulse Rate: 75 (11/01 0730)  Labs: Recent Labs    01/17/23 1352 01/17/23 1536 01/18/23 0106 01/18/23 0445 01/18/23 1250 01/18/23 1811 01/19/23 0013 01/19/23 0354 01/19/23 0925  HGB 14.0  --   --  11.7*  --   --   --  11.0*  --   HCT 44.0  --   --  36.4  --   --   --  34.7*  --   PLT 252  --   --  212  --   --   --  202  --   APTT 35  --   --   --   --   --   --   --   --   LABPROT 16.3*  --   --   --   --   --   --   --   --   INR 1.3*  --   --   --   --   --   --   --   --   HEPARINUNFRC  --   --    < >  --  0.18*  --  0.43  --  0.31  CREATININE 0.74  --   --  0.57  --  0.55  --  0.41*  --   TROPONINIHS 677* 633*  --   --  417*  --   --   --   --    < > = values in this interval not displayed.    Estimated Creatinine Clearance: 44.6 mL/min (A) (by C-G formula based on SCr of 0.41 mg/dL (L)).   Medical History: Past Medical History:  Diagnosis Date   Alzheimer disease (HCC)    Arthritis    bilateral knees   Hypertension    Vascular dementia (HCC)     Medications:  No history of chronic anticoagulation PTA  Assessment: 77 y.o. female with past medical history of hypertension and dementia who presents to the ED for altered mental status.  Per EMS, patient has been residing at [facility] and staff has noted increasing confusion over the past 2 to 3 days. EKG shows no evidence of arrhythmia, nonspecific ST/T abnormality noted. Pharmacy has been consulted for continuous heparin  infusion.  Baseline INR 1.3, aPTT 35s, H&H, PLT wnl  Goal of Therapy:  Heparin level 0.3-0.7 units/ml Monitor platelets by anticoagulation protocol: Yes  11/01 0013 HL 0.43, therapeutic x 1 11/01 0925 HL 0.31, therapeutic x 2  Plan: ---Continue heparin infusion at 900 units/hr ---Recheck HL with morning labs tomorrow ---Continue to monitor H&H and platelets  Bettey Costa, PharmD Clinical Pharmacist 01/19/2023 10:27 AM

## 2023-01-19 NOTE — Progress Notes (Signed)
Palliative Care Progress Note, Assessment & Plan   Patient Name: Kaylee Robinson       Date: 01/19/2023 DOB: July 12, 1945  Age: 77 y.o. MRN#: [id number] Attending Physician: Marcelino Duster, MD Primary Care Physician: [facility] Admit Date: 01/17/2023  Subjective: Patient is lying in bed in no apparent distress.  She is awake and alert.  She is unable to answer orientation questions appropriately.  She is able to nod yes and no to discussion of her symptoms.  No family or friends present during my visit.  HPI: 77 y.o. female  with past medical history of dementia, vascular dementia, and recent right hip bipolar hemiarthroplasty (12/07/2022) admitted from [facility] on 01/17/2023 with AMS and poor p.o. intake for the last 2 to 3 days.   Workup in the ED to date has revealed CT of head unremarkable, UA remarkable for possible UTI, CT angiogram of chest with PE, and ultrasound of lower extremities with multiple DVTs.  Patient is being treated with heparin gtt.   PMT was consulted to discuss goals of care.   Summary of counseling/coordination of care: Extensive chart review completed prior to meeting patient including labs, vital signs, imaging, progress notes, orders, and available advanced directive documents from current and previous encounters.   After reviewing the patient's chart and assessing the patient at bedside, I assessed patient's symptoms.  She denies pain or discomfort at this time.  She does move all extremities to command but is unable to vocalize anything linear/coherent.  After assessing the patient, I spoke with her [facility] DSS guardian Sentrell over the phone.  She shares concerns that she is not sure why palliative medicine is involved.  Discussed  difference between hospice care and palliative medicine.  I highlighted patient is not at end-of-life.  She has chronic illnesses with complex symptom management that could use extra support during this hospitalization.  She was relieved by this discussion.  She also was concerned that patient was going to be discharged soon.  I highlighted that patient still has complex medical issues to be resolved and does not have any signs of needing to be discharged soon.  Again, she was appreciative for this update.  Full code and full scope remain.  PMT will continue to follow and support patient throughout her hospitalization.  Guardian made aware that I am off service until Monday.  PMT remains available for any acute issues over the weekend.  Otherwise, I will follow-up with patient and guardian on Monday.  Physical Exam Vitals reviewed.  Constitutional:      General: She is not in acute distress.    Appearance: She is normal weight.  HENT:     Head: Normocephalic.     Nose: Nose normal.     Mouth/Throat:     Mouth: Mucous membranes are moist.  Eyes:     Pupils: Pupils are equal, round, and reactive to light.  Pulmonary:     Effort: Pulmonary effort is normal.  Abdominal:     Palpations: Abdomen is soft.  Musculoskeletal:     Comments: Generalized weakness  Skin:    General: Skin is warm and dry.  Neurological:  Mental Status: She is alert.  Psychiatric:        Behavior: Behavior normal.        Judgment: Judgment normal.             Total Time 35 minutes   Time spent includes: Detailed review of medical records (labs, imaging, vital signs), medically appropriate exam (mental status, respiratory, cardiac, skin), discussed with treatment team, counseling and educating patient, family and staff, documenting clinical information, medication management and coordination of care.  Samara Deist L. Bonita Quin, DNP, FNP-BC Palliative Medicine Team

## 2023-01-19 NOTE — ED Notes (Signed)
Informed RN bed assigned 

## 2023-01-19 NOTE — Progress Notes (Addendum)
Speech Language Pathology Treatment: Dysphagia  Patient Details Name: Kaylee Robinson MRN: [id number] DOB: 30-Jun-1945 Today's Date: 01/19/2023 Time: 2841-3244 SLP Time Calculation (min) (ACUTE ONLY): 30 min  Assessment / Plan / Recommendation Clinical Impression  Pt seen for ongoing assessment of swallowing and toleration of current dysphagia diet. Pt resting in bed; eyes closed. Alerted to MOD verbal/tactile stim; speech mumbled, incomprehensible. Alert to name only. She did not follow instructions w/ cue. On RA, afebrile. WBC trending down.   Pt appears to present w/ oropharyngeal phase dysphagia in setting of declined Cognitive status; Baseline Severe Dementia per MD note. She has not been eating/drinking recently at [facility] per chart. ANY Cognitive decline can impact overall awareness/timing of swallow and safety during po tasks which increases risk for aspiration, choking as well as challenges overall oral intake/nutrition.  Pt appears at increased risk for aspiration/aspiration pneumonia currently. Her risk for aspiration can be reduced when following aspiration precautions, given feeding assistance, and using a modified diet consistency. She is Edentulous currently.  She required MOD++ verbal/visual/tactile cues for follow through during po tasks and oral care.        Pt consumed few trials of purees and Nectar liquids via TSP w/ No immediate, overt clinical s/s of aspiration noted: no cough and no decline in respiratory status during/post trials. O2 sats remained 96-97 when checked%. Oral phase was c/b deficits in overall awareness of boluses orally/during oral prep(to take the po trial), in timely bolus management(lengthy oral phase time noted), and achieving full oral clearing of the puree boluses -- alternating boluses to aid oral clearing.  No trials of thin liquids nor solids were given d/t the Cognitive decline and reduced oral awareness -- not recommended at this time.  Mod Confusion w/  attempted oral care. Pt did not attempt to hold cup.          In setting of her presentation and her risk for aspiration, recommend a dysphagia level 1diet(PUREE foods) moistened for ease of oral phase/manipulation; Nectar liquids by TSP. Strict aspiration precautions; pt must be alert and engage in oral intake/swallowing for po's or HOLD po's. Reduce Distractions during meals and engage pt for self-feeding as able. Check for oral clearing at meals. Pills Crushed in Puree for safer swallowing. Supervision/Support w/ feeding at meals as needed.   Pt appears at reduced risk for aspiration/aspiration pneumonia when following general aspiration precautions w/ a modified diet consistency. This diet consistency of Pureed foods w/ Nectar liquids will be the most beneficial for supporting safe oral intake/nutrition w/ least amount of energy(from mastication).    ST services will sign off at this time w/ pt to have f/u at her next venue of care post discharge if needs indicate when she is better able to follow through w/ tasks. MD to reconsult if new swallowing needs while admitted. NSG updated. Precautions posted at bedside.  Recommend follow w/ Palliative Care for GOC and education re: impact of Cognitive decline/Dementia on swallowing and overall oral intake. Precautions posted in room.      HPI HPI: Pt is a 77 y.o. female with past medical history of hypertension and advanced Dementia who presents to the ED for altered mental status.  Per EMS, patient has been residing at [facility] and staff has noted increasing confusion over the past 2 to 3 days.  During this time, patient has not been able to take any of her medications and has not eaten or drink anything.  They have not noticed any  vomiting but she has been unable to follow commands with repetitive mumbling speech.  Patient does have advanced dementia and is reportedly difficult to understand at baseline.  Staff reported to EMS that patient had  elevated sodium and they have been giving IV fluids at the facility due to concern for dehydration.  Pt had a recent admit last month from NH for R hip and wrist pain.      SLP Plan  Discharge SLP treatment due to (comment) (f/u at next venue of care when [facility] improves)      Recommendations for follow up therapy are one component of a multi-disciplinary discharge planning process, led by the attending physician.  Recommendations may be updated based on patient status, additional functional criteria and insurance authorization.    Recommendations  Diet recommendations: Dysphagia 1 (puree);Nectar-thick liquid Liquids provided via: Teaspoon;Cup (monitor) Medication Administration: Crushed with puree Supervision: Staff to assist with self feeding;Full supervision/cueing for compensatory strategies Compensations: Minimize environmental distractions;Slow rate;Small sips/bites;Multiple dry swallows after each bite/sip;Follow solids with liquid;Lingual sweep for clearance of pocketing Postural Changes and/or Swallow Maneuvers: Out of bed for meals;Seated upright 90 degrees;Upright 30-60 min after meal                 (Dietician f/u; Palliative Care f/u w/ GOC) Oral care BID;Oral care before and after PO;Staff/trained caregiver to provide oral care   Frequent or constant Supervision/Assistance Dysphagia, oropharyngeal phase (R13.12) (baseline Dementia)     Discharge SLP treatment due to (comment) (f/u at next venue of care when State improves)       Jerilynn Som, MS, CCC-SLP Speech Language Pathologist Rehab Services; [facility] [place] [phone] (ascom) Kaylee Robinson  01/19/2023, 3:33 PM

## 2023-01-20 DIAGNOSIS — F03918 Unspecified dementia, unspecified severity, with other behavioral disturbance: Secondary | ICD-10-CM | POA: Diagnosis not present

## 2023-01-20 DIAGNOSIS — E870 Hyperosmolality and hypernatremia: Secondary | ICD-10-CM | POA: Diagnosis not present

## 2023-01-20 DIAGNOSIS — B964 Proteus (mirabilis) (morganii) as the cause of diseases classified elsewhere: Secondary | ICD-10-CM

## 2023-01-20 DIAGNOSIS — I2609 Other pulmonary embolism with acute cor pulmonale: Secondary | ICD-10-CM | POA: Diagnosis not present

## 2023-01-20 DIAGNOSIS — I214 Non-ST elevation (NSTEMI) myocardial infarction: Secondary | ICD-10-CM | POA: Diagnosis not present

## 2023-01-20 DIAGNOSIS — N390 Urinary tract infection, site not specified: Secondary | ICD-10-CM

## 2023-01-20 LAB — HEPARIN LEVEL (UNFRACTIONATED): Heparin Unfractionated: 0.33 [IU]/mL (ref 0.30–0.70)

## 2023-01-20 LAB — URINE CULTURE: Culture: 20000 — AB

## 2023-01-20 LAB — BASIC METABOLIC PANEL
Anion gap: 5 (ref 5–15)
BUN: 22 mg/dL (ref 8–23)
CO2: 22 mmol/L (ref 22–32)
Calcium: 8 mg/dL — ABNORMAL LOW (ref 8.9–10.3)
Chloride: 119 mmol/L — ABNORMAL HIGH (ref 98–111)
Creatinine, Ser: 0.52 mg/dL (ref 0.44–1.00)
GFR, Estimated: >60 mL/min (ref >60)
Glucose, Bld: 115 mg/dL — ABNORMAL HIGH (ref 70–99)
Potassium: 4 mmol/L (ref 3.5–5.1)
Sodium: 146 mmol/L — ABNORMAL HIGH (ref 135–145)

## 2023-01-20 MED ORDER — DEXTROSE-SODIUM CHLORIDE 5-0.45 % IV SOLN: INTRAVENOUS | Status: AC

## 2023-01-20 NOTE — Progress Notes (Signed)
Progress Note   [name] QIO:[id number] DOB: 1945/10/08 DOA: 01/17/2023     3 DOS: the patient was seen and examined on 01/20/2023   Brief hospital course: Kaylee Robinson is a 77 y.o. female with medical history significant of dementia, hypertension, last admission 12/07/2022 for hip fracture presented to the emergency department for evaluation of altered mental status.  Patient is white documented resident has been more confused for past 2 to 3 days.  Patient has been not eating well as per ED provider.  During my exam she has repetitive mumbling speech and does not answer my questions.  Patient has advanced dementia but her mental status has worsened from baseline per nursing staff.   In the emergency department, her vital signs are stable, initially admitted for further management evaluation of metabolic encephalopathy secondary to possible UTI, elevated troponin. Later noted to have left lower lobe multiple occlusive emboli, at least submassive PE on CTA chest, started on heparin drip.  Assessment and Plan: Submassive left lower lobe PE Continue heparin drip per weight based pharmacy protocol for now with pending decision of pulmonary thrombectomy as per vascular team. Elevated troponin and BNP in the setting of pulmonary embolism. Echo reviewed EF 60-65% shows no RV strain. Pulmonary signed off, no acute intervention at this time, continue anticoagulation. Continue to monitor her vitals, saturation closely.   Acute metabolic encephalopathy: Baseline poor mental status due to advanced dementia. CT scan head unremarkable. Continue neurochecks. Delirium precautions. Fall, aspiration precautions.   Hypernatremia- In the setting of poor oral intake, dehydration Gentle IV fluids D5 1/2 NS. Slow correction of sodium. Telemetry monitoring. Trend sodium closely.   Hypokalemia- K 4.0 today. Monitor daily electrolytes.   Proteus UTI- Urine cultures reviewed. Continue IV  Rocephin therapy.   Poor oral intake Failure to thrive: Severe malnutrition: Poor oral intake due to advanced dementia. Her overall prognosis is poor. Speech and swallow evaluation done advised dys 1 diet. She is at high risk for aspiration.    Goals of care: Palliative team continue to discuss with DSS regarding her goals of care. Continue nursing supportive care. Fall, aspiration precautions. DVT prophylaxis - heparin drip   Code Status: Full Code  Subjective: Patient is seen and examined today morning. She is unable to have a meaningful conversation. No overnight issues. Does drink a bit but not eating well.  Physical Exam: Vitals:   01/19/23 2213 01/20/23 0046 01/20/23 0422 01/20/23 0806  BP: 93/69 98/68 105/68 104/66  Pulse: 87 88 69 65  Resp: 16 20 18 16   Temp: 98.3 F (36.8 C) 100 F (37.8 C) 98.6 F (37 C) (!) 97.5 F (36.4 C)  TempSrc:      SpO2: 97% 97% 97% 99%  Weight:      Height:        General - Elderly thin built ill female, mumbling, does not answer questions HEENT - PERRLA, EOMI, atraumatic head, non tender sinuses. Lung - Clear, rales, rhonchi, wheezes. Heart - S1, S2 heard, no murmurs, rubs, no pedal edema. Abdomen-soft, nontender, nondistended. Neuro - sleeping, unable to do full neuroexam Skin - Warm and dry.  Data Reviewed:      Latest Ref Rng & Units 01/19/2023    3:54 AM 01/18/2023    4:45 AM 01/17/2023    1:52 PM  CBC  WBC 4.0 - 10.5 K/uL 10.6  13.7  16.5   Hemoglobin 12.0 - 15.0 g/dL 32.4  40.1  02.7   Hematocrit 36.0 - 46.0 %  34.7  36.4  44.0   Platelets 150 - 400 K/uL 202  212  252       Latest Ref Rng & Units 01/20/2023    5:26 AM 01/19/2023    3:54 AM 01/18/2023    6:11 PM  BMP  Glucose 70 - 99 mg/dL 981  191  478   BUN 8 - 23 mg/dL 22  31  40   Creatinine 0.44 - 1.00 mg/dL 2.95  6.21  3.08   Sodium 135 - 145 mmol/L 146  147  149   Potassium 3.5 - 5.1 mmol/L 4.0  3.0  3.0   Chloride 98 - 111 mmol/L 119  115  117   CO2 22  - 32 mmol/L 22  24  24    Calcium 8.9 - 10.3 mg/dL 8.0  7.7  8.1    No results found.   Family Communication: ongoing discussion with DSS by palliative regarding goals of care.  Disposition: Status is: Inpatient Remains inpatient appropriate because: management of PE, electrolyte abnormalities.  Planned Discharge Destination: Skilled nursing facility     Time spent 39 min.  Author: Marcelino Duster, MD 01/20/2023 11:06 AM Secure chat 7am to 7pm For on call review [email or web address].

## 2023-01-20 NOTE — Consult Note (Signed)
PHARMACY - ANTICOAGULATION CONSULT NOTE  Pharmacy Consult for Continuous Heparin Infusion Indication:  NSTEMI  Allergies  Allergen Reactions   Enalapril Other (See Comments)   Naproxen Swelling   Amoxicillin Rash   Sulfa Antibiotics Rash    Patient Measurements: Height: 5\' 6"  (167.6 cm) Weight: 48 kg (105 lb 13.1 oz) IBW/kg (Calculated) : 59.3 Heparin Dosing Weight: 48 kg  Vital Signs: Temp: 98.6 F (37 C) (11/02 0422) BP: 105/68 (11/02 0422) Pulse Rate: 69 (11/02 0422)  Labs: Recent Labs    01/17/23 1352 01/17/23 1536 01/18/23 0106 01/18/23 0445 01/18/23 1250 01/18/23 1811 01/19/23 0013 01/19/23 0354 01/19/23 0925 01/20/23 0529  HGB 14.0  --   --  11.7*  --   --   --  11.0*  --   --   HCT 44.0  --   --  36.4  --   --   --  34.7*  --   --   PLT 252  --   --  212  --   --   --  202  --   --   APTT 35  --   --   --   --   --   --   --   --   --   LABPROT 16.3*  --   --   --   --   --   --   --   --   --   INR 1.3*  --   --   --   --   --   --   --   --   --   HEPARINUNFRC  --   --    < >  --  0.18*  --  0.43  --  0.31 0.33  CREATININE 0.74  --   --  0.57  --  0.55  --  0.41*  --   --   TROPONINIHS 677* 633*  --   --  417*  --   --   --   --   --    < > = values in this interval not displayed.    Estimated Creatinine Clearance: 44.6 mL/min (A) (by C-G formula based on SCr of 0.41 mg/dL (L)).   Medical History: Past Medical History:  Diagnosis Date   Alzheimer disease (HCC)    Arthritis    bilateral knees   Hypertension    Vascular dementia (HCC)     Medications:  No history of chronic anticoagulation PTA  Assessment: 77 y.o. female with past medical history of hypertension and dementia who presents to the ED for altered mental status.  Per EMS, patient has been residing at [facility] and staff has noted increasing confusion over the past 2 to 3 days. EKG shows no evidence of arrhythmia, nonspecific ST/T abnormality noted. Pharmacy has been  consulted for continuous heparin infusion.  Baseline INR 1.3, aPTT 35s, H&H, PLT wnl  Goal of Therapy:  Heparin level 0.3-0.7 units/ml Monitor platelets by anticoagulation protocol: Yes  11/01 0013 HL 0.43, therapeutic x 1 11/01 0925 HL 0.31, therapeutic x 2 11/02 0529 HL 0.33, therapeutic x 3  Plan: ---Continue heparin infusion at 900 units/hr ---Recheck HL with morning labs tomorrow ---Continue to monitor H&H and platelets  Otelia Sergeant, PharmD, [facility] 01/20/2023 6:55 AM

## 2023-01-21 DIAGNOSIS — F03918 Unspecified dementia, unspecified severity, with other behavioral disturbance: Secondary | ICD-10-CM | POA: Diagnosis not present

## 2023-01-21 DIAGNOSIS — I2609 Other pulmonary embolism with acute cor pulmonale: Secondary | ICD-10-CM | POA: Diagnosis not present

## 2023-01-21 DIAGNOSIS — E870 Hyperosmolality and hypernatremia: Secondary | ICD-10-CM | POA: Diagnosis not present

## 2023-01-21 DIAGNOSIS — I214 Non-ST elevation (NSTEMI) myocardial infarction: Secondary | ICD-10-CM | POA: Diagnosis not present

## 2023-01-21 LAB — CBC
HCT: 34.3 % — ABNORMAL LOW (ref 36.0–46.0)
Hemoglobin: 10.8 g/dL — ABNORMAL LOW (ref 12.0–15.0)
MCH: 30.2 pg (ref 26.0–34.0)
MCHC: 31.5 g/dL (ref 30.0–36.0)
MCV: 95.8 fL (ref 80.0–100.0)
Platelets: 194 10*3/uL (ref 150–400)
RBC: 3.58 MIL/uL — ABNORMAL LOW (ref 3.87–5.11)
RDW: 14.1 % (ref 11.5–15.5)
WBC: 8.8 10*3/uL (ref 4.0–10.5)
nRBC: 0 % (ref 0.0–0.2)

## 2023-01-21 LAB — BASIC METABOLIC PANEL
Anion gap: 6 (ref 5–15)
BUN: 17 mg/dL (ref 8–23)
CO2: 22 mmol/L (ref 22–32)
Calcium: 8.4 mg/dL — ABNORMAL LOW (ref 8.9–10.3)
Chloride: 118 mmol/L — ABNORMAL HIGH (ref 98–111)
Creatinine, Ser: 0.52 mg/dL (ref 0.44–1.00)
GFR, Estimated: >60 mL/min (ref >60)
Glucose, Bld: 103 mg/dL — ABNORMAL HIGH (ref 70–99)
Potassium: 4.6 mmol/L (ref 3.5–5.1)
Sodium: 146 mmol/L — ABNORMAL HIGH (ref 135–145)

## 2023-01-21 LAB — GLUCOSE, CAPILLARY
Glucose-Capillary: 20 mg/dL — CL (ref 70–99)
Glucose-Capillary: 70 mg/dL (ref 70–99)
Glucose-Capillary: 108 mg/dL — ABNORMAL HIGH (ref 70–99)

## 2023-01-21 LAB — HEPARIN LEVEL (UNFRACTIONATED)
Heparin Unfractionated: 0.18 [IU]/mL — ABNORMAL LOW (ref 0.30–0.70)
Heparin Unfractionated: 0.27 [IU]/mL — ABNORMAL LOW (ref 0.30–0.70)

## 2023-01-21 MED ORDER — DEXTROSE-SODIUM CHLORIDE 5-0.45 % IV SOLN: INTRAVENOUS | Status: AC

## 2023-01-21 MED ORDER — HEPARIN BOLUS VIA INFUSION
1400.0000 [IU] | Freq: Once | INTRAVENOUS | Status: AC
  Administered 2023-01-21: 1400 [IU] via INTRAVENOUS
  Filled 2023-01-21: qty 1400

## 2023-01-21 MED ORDER — DEXTROSE 50 % IV SOLN
INTRAVENOUS | Status: AC
  Filled 2023-01-21: qty 50

## 2023-01-21 MED ORDER — HEPARIN BOLUS VIA INFUSION
700.0000 [IU] | Freq: Once | INTRAVENOUS | Status: AC
  Administered 2023-01-21: 700 [IU] via INTRAVENOUS
  Filled 2023-01-21: qty 700

## 2023-01-21 MED ORDER — DEXTROSE 50 % IV SOLN
25.0000 g | INTRAVENOUS | Status: AC
  Administered 2023-01-21: 25 g via INTRAVENOUS

## 2023-01-21 NOTE — Progress Notes (Signed)
Progress Note   [name] [id number] DOB: Aug 07, 1945 DOA: 01/17/2023     4 DOS: the patient was seen and examined on 01/21/2023   Brief hospital course: Danene Montijo is a 77 y.o. female with medical history significant of dementia, hypertension, last admission 12/07/2022 for hip fracture presented to the emergency department for evaluation of altered mental status.  Patient is white documented resident has been more confused for past 2 to 3 days.  Patient has been not eating well as per ED provider.  During my exam she has repetitive mumbling speech and does not answer my questions.  Patient has advanced dementia but her mental status has worsened from baseline per nursing staff.   In the emergency department, her vital signs are stable, initially admitted for further management evaluation of metabolic encephalopathy secondary to possible UTI, elevated troponin. Later noted to have left lower lobe multiple occlusive emboli, at least submassive PE on CTA chest, started on heparin drip.  Assessment and Plan: Submassive left lower lobe PE Continue heparin drip per weight based pharmacy protocol for now with pending decision of pulmonary thrombectomy as per vascular team. Elevated troponin and BNP in the setting of pulmonary embolism. Echo reviewed EF 60-65% shows no RV strain. Pulmonary signed off, no acute intervention at this time, continue anticoagulation. Continue to monitor her vitals, saturation closely.   Acute metabolic encephalopathy: Baseline poor mental status due to advanced dementia. CT scan head unremarkable. Continue neurochecks. Delirium precautions. Fall, aspiration precautions.   Hypernatremia- In the setting of poor oral intake, dehydration Gentle IV fluids D5 1/2 NS. Slow correction of sodium. Trend sodium closely.   Hypokalemia- K 4.6 today. Monitor daily electrolytes.   Proteus UTI- Urine cultures reviewed. Continue IV Rocephin therapy total 5  days.   Poor oral intake Failure to thrive: Severe malnutrition: Poor oral intake due to advanced dementia. Her overall prognosis is poor. Continue dys 1 diet. She is at high risk for aspiration.    Goals of care: Palliative team continue to discuss with DSS regarding her goals of care. Continue nursing supportive care. Fall, aspiration precautions. DVT prophylaxis - heparin drip   Code Status: Full Code  Subjective: Patient is seen and examined today morning. She continue to mumble, no meaningful conversation. No overnight issues.    Physical Exam: Vitals:   01/21/23 0056 01/21/23 0439 01/21/23 0941 01/21/23 0941  BP: 112/66 109/65 118/72 118/72  Pulse: 72 65 65 64  Resp: 18 20 16 16   Temp: 98.2 F (36.8 C) 98.5 F (36.9 C) 98.4 F (36.9 C) 98.4 F (36.9 C)  TempSrc: Oral     SpO2: 100% 99% 100% 92%  Weight:      Height:        General - Elderly thin built ill female, mumbling, does not answer questions HEENT - PERRLA, EOMI, atraumatic head, non tender sinuses. Lung - Clear, rales, rhonchi, wheezes. Heart - S1, S2 heard, no murmurs, rubs, no pedal edema. Abdomen-soft, nontender, nondistended. Neuro - sleeping, unable to do full neuroexam Skin - Warm and dry.  Data Reviewed:      Latest Ref Rng & Units 01/21/2023    5:45 AM 01/19/2023    3:54 AM 01/18/2023    4:45 AM  CBC  WBC 4.0 - 10.5 K/uL 8.8  10.6  13.7   Hemoglobin 12.0 - 15.0 g/dL 29.5  18.8  41.6   Hematocrit 36.0 - 46.0 % 34.3  34.7  36.4   Platelets 150 - 400 K/uL  194  202  212       Latest Ref Rng & Units 01/21/2023    5:45 AM 01/20/2023    5:26 AM 01/19/2023    3:54 AM  BMP  Glucose 70 - 99 mg/dL 578  469  629   BUN 8 - 23 mg/dL 17  22  31    Creatinine 0.44 - 1.00 mg/dL 5.28  4.13  2.44   Sodium 135 - 145 mmol/L 146  146  147   Potassium 3.5 - 5.1 mmol/L 4.6  4.0  3.0   Chloride 98 - 111 mmol/L 118  119  115   CO2 22 - 32 mmol/L 22  22  24    Calcium 8.9 - 10.3 mg/dL 8.4  8.0  7.7    No  results found.   Family Communication: ongoing discussion with DSS by palliative regarding goals of care.  Disposition: Status is: Inpatient Remains inpatient appropriate because: management of PE, electrolyte abnormalities.  Planned Discharge Destination: Skilled nursing facility     Time spent 37 min.  Author: Marcelino Duster, MD 01/21/2023 10:57 AM Secure chat 7am to 7pm For on call review [email or web address]

## 2023-01-21 NOTE — Consult Note (Signed)
PHARMACY - ANTICOAGULATION CONSULT NOTE  Pharmacy Consult for Continuous Heparin Infusion Indication:  NSTEMI  Allergies  Allergen Reactions   Enalapril Other (See Comments)   Naproxen Swelling   Amoxicillin Rash   Sulfa Antibiotics Rash    Patient Measurements: Height: 5\' 6"  (167.6 cm) Weight: 48 kg (105 lb 13.1 oz) IBW/kg (Calculated) : 59.3 Heparin Dosing Weight: 48 kg  Vital Signs: Temp: 98 F (36.7 C) (11/03 1535) BP: 100/60 (11/03 1535) Pulse Rate: 70 (11/03 1535)  Labs: Recent Labs    01/19/23 0354 01/19/23 0925 01/20/23 0526 01/20/23 0529 01/21/23 0545 01/21/23 1613  HGB 11.0*  --   --   --  10.8*  --   HCT 34.7*  --   --   --  34.3*  --   PLT 202  --   --   --  194  --   HEPARINUNFRC  --    < >  --  0.33 0.18* 0.27*  CREATININE 0.41*  --  0.52  --  0.52  --    < > = values in this interval not displayed.    Estimated Creatinine Clearance: 44.6 mL/min (by C-G formula based on SCr of 0.52 mg/dL).   Medical History: Past Medical History:  Diagnosis Date   Alzheimer disease (HCC)    Arthritis    bilateral knees   Hypertension    Vascular dementia (HCC)     Medications:  No history of chronic anticoagulation PTA  Assessment: 77 y.o. female with past medical history of hypertension and dementia who presents to the ED for altered mental status.  Per EMS, patient has been residing at [facility] and staff has noted increasing confusion over the past 2 to 3 days. EKG shows no evidence of arrhythmia, nonspecific ST/T abnormality noted. Pharmacy has been consulted for continuous heparin infusion.  Baseline INR 1.3, aPTT 35s, H&H, PLT wnl  Goal of Therapy:  Heparin level 0.3-0.7 units/ml Monitor platelets by anticoagulation protocol: Yes  11/01 0013 HL 0.43, therapeutic x 1 11/01 0925 HL 0.31, therapeutic x 2 11/02 0529 HL 0.33, therapeutic x 3 11/03 0545 HL 0.18, subtherapeutic 11/03 1613 HL 0.27, subtherapeutic  Plan: ---Bolus 700 units x  1 ---Increase heparin infusion to 1150 units/hr ---Recheck HL in 8 hr after rate change ---Continue to monitor H&H and platelets  Barrie Folk,  PharmD 01/21/2023 4:50 PM

## 2023-01-21 NOTE — Plan of Care (Signed)

## 2023-01-21 NOTE — Consult Note (Signed)
PHARMACY - ANTICOAGULATION CONSULT NOTE  Pharmacy Consult for Continuous Heparin Infusion Indication:  NSTEMI  Allergies  Allergen Reactions   Enalapril Other (See Comments)   Naproxen Swelling   Amoxicillin Rash   Sulfa Antibiotics Rash    Patient Measurements: Height: 5\' 6"  (167.6 cm) Weight: 48 kg (105 lb 13.1 oz) IBW/kg (Calculated) : 59.3 Heparin Dosing Weight: 48 kg  Vital Signs: Temp: 98.5 F (36.9 C) (11/03 0439) Temp Source: Oral (11/03 0056) BP: 109/65 (11/03 0439) Pulse Rate: 65 (11/03 0439)  Labs: Recent Labs    01/18/23 1250 01/18/23 1811 01/19/23 0013 01/19/23 0354 01/19/23 0925 01/20/23 0526 01/20/23 0529 01/21/23 0545  HGB  --   --   --  11.0*  --   --   --  10.8*  HCT  --   --   --  34.7*  --   --   --  34.3*  PLT  --   --   --  202  --   --   --  194  HEPARINUNFRC 0.18*  --    < >  --  0.31  --  0.33 0.18*  CREATININE  --  0.55  --  0.41*  --  0.52  --   --   TROPONINIHS 417*  --   --   --   --   --   --   --    < > = values in this interval not displayed.    Estimated Creatinine Clearance: 44.6 mL/min (by C-G formula based on SCr of 0.52 mg/dL).   Medical History: Past Medical History:  Diagnosis Date   Alzheimer disease (HCC)    Arthritis    bilateral knees   Hypertension    Vascular dementia (HCC)     Medications:  No history of chronic anticoagulation PTA  Assessment: 77 y.o. female with past medical history of hypertension and dementia who presents to the ED for altered mental status.  Per EMS, patient has been residing at [facility] and staff has noted increasing confusion over the past 2 to 3 days. EKG shows no evidence of arrhythmia, nonspecific ST/T abnormality noted. Pharmacy has been consulted for continuous heparin infusion.  Baseline INR 1.3, aPTT 35s, H&H, PLT wnl  Goal of Therapy:  Heparin level 0.3-0.7 units/ml Monitor platelets by anticoagulation protocol: Yes  11/01 0013 HL 0.43, therapeutic x 1 11/01  0925 HL 0.31, therapeutic x 2 11/02 0529 HL 0.33, therapeutic x 3 11/03 0545 HL 0.18, subtherapeutic  Plan: ---Bolus 1400 units x 1 ---Increase heparin infusion to 1050 units/hr ---Recheck HL in 8 hr after rate change ---Continue to monitor H&H and platelets  Otelia Sergeant, PharmD, [facility] 01/21/2023 6:38 AM

## 2023-01-22 DIAGNOSIS — I2609 Other pulmonary embolism with acute cor pulmonale: Secondary | ICD-10-CM | POA: Diagnosis not present

## 2023-01-22 DIAGNOSIS — F03918 Unspecified dementia, unspecified severity, with other behavioral disturbance: Secondary | ICD-10-CM | POA: Diagnosis not present

## 2023-01-22 DIAGNOSIS — E870 Hyperosmolality and hypernatremia: Secondary | ICD-10-CM | POA: Diagnosis not present

## 2023-01-22 DIAGNOSIS — I214 Non-ST elevation (NSTEMI) myocardial infarction: Secondary | ICD-10-CM | POA: Diagnosis not present

## 2023-01-22 LAB — GLUCOSE, CAPILLARY
Glucose-Capillary: 75 mg/dL (ref 70–99)
Glucose-Capillary: 59 mg/dL — ABNORMAL LOW (ref 70–99)
Glucose-Capillary: 24 mg/dL — CL (ref 70–99)
Glucose-Capillary: 109 mg/dL — ABNORMAL HIGH (ref 70–99)
Glucose-Capillary: 89 mg/dL (ref 70–99)
Glucose-Capillary: 87 mg/dL (ref 70–99)
Glucose-Capillary: 102 mg/dL — ABNORMAL HIGH (ref 70–99)

## 2023-01-22 LAB — HEPARIN LEVEL (UNFRACTIONATED)
Heparin Unfractionated: 0.37 [IU]/mL (ref 0.30–0.70)
Heparin Unfractionated: 0.27 [IU]/mL — ABNORMAL LOW (ref 0.30–0.70)
Heparin Unfractionated: 0.39 [IU]/mL (ref 0.30–0.70)

## 2023-01-22 LAB — BASIC METABOLIC PANEL
Anion gap: 7 (ref 5–15)
BUN: 15 mg/dL (ref 8–23)
CO2: 23 mmol/L (ref 22–32)
Calcium: 8.5 mg/dL — ABNORMAL LOW (ref 8.9–10.3)
Chloride: 109 mmol/L (ref 98–111)
Creatinine, Ser: 0.46 mg/dL (ref 0.44–1.00)
GFR, Estimated: >60 mL/min (ref >60)
Glucose, Bld: 97 mg/dL (ref 70–99)
Potassium: 4.3 mmol/L (ref 3.5–5.1)
Sodium: 139 mmol/L (ref 135–145)

## 2023-01-22 LAB — CBC
HCT: 33.3 % — ABNORMAL LOW (ref 36.0–46.0)
Hemoglobin: 10.6 g/dL — ABNORMAL LOW (ref 12.0–15.0)
MCH: 30.2 pg (ref 26.0–34.0)
MCHC: 31.8 g/dL (ref 30.0–36.0)
MCV: 94.9 fL (ref 80.0–100.0)
Platelets: 183 10*3/uL (ref 150–400)
RBC: 3.51 MIL/uL — ABNORMAL LOW (ref 3.87–5.11)
RDW: 14.2 % (ref 11.5–15.5)
WBC: 9.1 10*3/uL (ref 4.0–10.5)
nRBC: 0 % (ref 0.0–0.2)

## 2023-01-22 LAB — CULTURE, BLOOD (ROUTINE X 2)
Culture: NO GROWTH
Culture: NO GROWTH
Special Requests: ADEQUATE
Special Requests: ADEQUATE

## 2023-01-22 MED ORDER — NEPRO/CARBSTEADY PO LIQD
237.0000 mL | Freq: Three times a day (TID) | ORAL | Status: DC
  Administered 2023-01-22 – 2023-01-23 (×2): 237 mL via ORAL

## 2023-01-22 MED ORDER — HEPARIN BOLUS VIA INFUSION
700.0000 [IU] | Freq: Once | INTRAVENOUS | Status: AC
  Administered 2023-01-22: 700 [IU] via INTRAVENOUS
  Filled 2023-01-22: qty 700

## 2023-01-22 NOTE — Consult Note (Signed)
PHARMACY - ANTICOAGULATION CONSULT NOTE  Pharmacy Consult for Continuous Heparin Infusion Indication:  NSTEMI  Allergies  Allergen Reactions   Enalapril Other (See Comments)   Naproxen Swelling   Amoxicillin Rash   Sulfa Antibiotics Rash    Patient Measurements: Height: 5\' 6"  (167.6 cm) Weight: 48 kg (105 lb 13.1 oz) IBW/kg (Calculated) : 59.3 Heparin Dosing Weight: 48 kg  Vital Signs: Temp: 98 F (36.7 C) (11/03 2011) Temp Source: Oral (11/03 2011) BP: 116/64 (11/03 2011) Pulse Rate: 75 (11/03 2011)  Labs: Recent Labs    01/19/23 0354 01/19/23 0925 01/20/23 0526 01/20/23 0529 01/21/23 0545 01/21/23 1613 01/22/23 0059  HGB 11.0*  --   --   --  10.8*  --  10.6*  HCT 34.7*  --   --   --  34.3*  --  33.3*  PLT 202  --   --   --  194  --  183  HEPARINUNFRC  --    < >  --    < > 0.18* 0.27* 0.37  CREATININE 0.41*  --  0.52  --  0.52  --   --    < > = values in this interval not displayed.    Estimated Creatinine Clearance: 44.6 mL/min (by C-G formula based on SCr of 0.52 mg/dL).   Medical History: Past Medical History:  Diagnosis Date   Alzheimer disease (HCC)    Arthritis    bilateral knees   Hypertension    Vascular dementia (HCC)     Medications:  No history of chronic anticoagulation PTA  Assessment: 77 y.o. female with past medical history of hypertension and dementia who presents to the ED for altered mental status.  Per EMS, patient has been residing at [facility] and staff has noted increasing confusion over the past 2 to 3 days. EKG shows no evidence of arrhythmia, nonspecific ST/T abnormality noted. Pharmacy has been consulted for continuous heparin infusion.  Baseline INR 1.3, aPTT 35s, H&H, PLT wnl  Goal of Therapy:  Heparin level 0.3-0.7 units/ml Monitor platelets by anticoagulation protocol: Yes  11/01 0013 HL 0.43, therapeutic x 1 11/01 0925 HL 0.31, therapeutic x 2 11/02 0529 HL 0.33, therapeutic x 3 11/03 0545 HL 0.18,  subtherapeutic 11/03 1613 HL 0.27, subtherapeutic 11/04 0059 HL 0.37, therapeutic x 1  Plan: ---Continue heparin infusion at 1150 units/hr ---Recheck HL in 8 hr to confirm ---Continue to monitor H&H and platelets  Otelia Sergeant, PharmD, MBA 01/22/2023 1:30 AM

## 2023-01-22 NOTE — Progress Notes (Signed)
Progress Note   [name] VQQ:[id number] DOB: Sep 15, 1945 DOA: 01/17/2023     5 DOS: the patient was seen and examined on 01/22/2023   Brief hospital course: Kaylee Robinson is a 77 y.o. female with medical history significant of dementia, hypertension, last admission 12/07/2022 for hip fracture presented to the emergency department for evaluation of altered mental status.  Patient is white documented resident has been more confused for past 2 to 3 days.  Patient has been not eating well as per ED provider.  During my exam she has repetitive mumbling speech and does not answer my questions.  Patient has advanced dementia but her mental status has worsened from baseline per nursing staff.   In the emergency department, her vital signs are stable, initially admitted for further management evaluation of metabolic encephalopathy secondary to possible UTI, elevated troponin. Later noted to have left lower lobe multiple occlusive emboli, at least submassive PE on CTA chest, started on heparin drip. Vascular, pulmonary consulted. Palliative involved for goals of care discussion with DSS.  Assessment and Plan: Submassive left lower lobe PE Continue heparin drip per weight based pharmacy protocol with pending decision of pulmonary thrombectomy as per vascular team. Elevated troponin and BNP in the setting of pulmonary embolism. Echo reviewed EF 60-65% shows no RV strain. Pulmonary signed off, no acute intervention at this time, continue anticoagulation. Continue to monitor her vitals, saturation closely.   Acute metabolic encephalopathy: Baseline poor mental status due to advanced dementia. CT scan head unremarkable. Continue neurochecks. Delirium precautions. Fall, aspiration precautions.   Hypernatremia- In the setting of poor oral intake, dehydration Gentle IV fluids D5 1/2 NS. Slow correction of sodium. Trend sodium closely.   Hypokalemia- K 4.3 today. Monitor electrolytes and  replace accordingly.   Proteus UTI- Urine cultures reviewed. Finished IV Rocephin therapy total 5 days.   Poor oral intake Failure to thrive: Severe malnutrition: Poor oral intake due to advanced dementia. Continue dys 1 diet. She is at high risk for aspiration.    Goals of care: Palliative team continue to discuss with DSS regarding her goals of care. Continue nursing supportive care. Her overall prognosis is poor. Fall, aspiration precautions. DVT prophylaxis - heparin drip   Code Status: Full Code  Subjective: Patient is seen and examined today morning. She is better communicating, though confused. Blood sugars low, able to eat. No other issues.    Physical Exam: Vitals:   01/21/23 2011 01/21/23 2011 01/22/23 0506 01/22/23 0809  BP: 116/64 116/64 (!) 98/59 104/69  Pulse: 75 75 (!) 55 (!) 58  Resp: 18 16 17 20   Temp: 98 F (36.7 C) 98 F (36.7 C) (!) 97.5 F (36.4 C) 97.8 F (36.6 C)  TempSrc:  Oral Oral   SpO2: 91% 91% 100% 100%  Weight:      Height:        General - Elderly thin built ill female, able to answer questions HEENT - PERRLA, EOMI, atraumatic head, non tender sinuses. Lung - Clear, rales, rhonchi, wheezes. Heart - S1, S2 heard, no murmurs, rubs, no pedal edema. Abdomen-soft, nontender, nondistended. Neuro - alert, awake, non focal neuro exam Skin - Warm and dry.  Data Reviewed:      Latest Ref Rng & Units 01/22/2023   12:59 AM 01/21/2023    5:45 AM 01/19/2023    3:54 AM  CBC  WBC 4.0 - 10.5 K/uL 9.1  8.8  10.6   Hemoglobin 12.0 - 15.0 g/dL 43.3  29.5  11.0  Hematocrit 36.0 - 46.0 % 33.3  34.3  34.7   Platelets 150 - 400 K/uL 183  194  202       Latest Ref Rng & Units 01/22/2023   10:10 AM 01/21/2023    5:45 AM 01/20/2023    5:26 AM  BMP  Glucose 70 - 99 mg/dL 97  324  401   BUN 8 - 23 mg/dL 15  17  22    Creatinine 0.44 - 1.00 mg/dL 0.27  2.53  6.64   Sodium 135 - 145 mmol/L 139  146  146   Potassium 3.5 - 5.1 mmol/L 4.3  4.6  4.0    Chloride 98 - 111 mmol/L 109  118  119   CO2 22 - 32 mmol/L 23  22  22    Calcium 8.9 - 10.3 mg/dL 8.5  8.4  8.0    No results found.   Family Communication: ongoing discussion with DSS by palliative regarding goals of care.  Disposition: Status is: Inpatient Remains inpatient appropriate because: management of PE, UTI, electrolyte abnormalities.  Planned Discharge Destination: Skilled nursing facility     Time spent 38 min.  Author: Marcelino Duster, MD 01/22/2023 12:51 PM Secure chat 7am to 7pm For on call review [email or web address].

## 2023-01-22 NOTE — Plan of Care (Signed)
  Problem: Education: Goal: Knowledge of General Education information will improve Description: Including pain rating scale, medication(s)/side effects and non-pharmacologic comfort measures Outcome: Progressing   Problem: Activity: Goal: Risk for activity intolerance will decrease Outcome: Progressing   Problem: Nutrition: Goal: Adequate nutrition will be maintained Outcome: Progressing   Problem: Pain Management: Goal: General experience of comfort will improve Outcome: Progressing   Problem: Safety: Goal: Ability to remain free from injury will improve Outcome: Progressing   Problem: Skin Integrity: Goal: Risk for impaired skin integrity will decrease Outcome: Progressing

## 2023-01-22 NOTE — Consult Note (Signed)
PHARMACY - ANTICOAGULATION CONSULT NOTE  Pharmacy Consult for Continuous Heparin Infusion Indication:  NSTEMI  Allergies  Allergen Reactions   Enalapril Other (See Comments)   Naproxen Swelling   Amoxicillin Rash   Sulfa Antibiotics Rash   Patient Measurements: Height: 5\' 6"  (167.6 cm) Weight: 48 kg (105 lb 13.1 oz) IBW/kg (Calculated) : 59.3 Heparin Dosing Weight: 48 kg  Vital Signs: Temp: 97.8 F (36.6 C) (11/04 0809) Temp Source: Oral (11/04 0506) BP: 104/69 (11/04 0809) Pulse Rate: 58 (11/04 0809)  Labs: Recent Labs    01/20/23 0526 01/20/23 0529 01/21/23 0545 01/21/23 1613 01/22/23 0059 01/22/23 1010  HGB  --   --  10.8*  --  10.6*  --   HCT  --   --  34.3*  --  33.3*  --   PLT  --   --  194  --  183  --   HEPARINUNFRC  --    < > 0.18* 0.27* 0.37 0.27*  CREATININE 0.52  --  0.52  --   --   --    < > = values in this interval not displayed.   Estimated Creatinine Clearance: 44.6 mL/min (by C-G formula based on SCr of 0.52 mg/dL).  Medical History: Past Medical History:  Diagnosis Date   Alzheimer disease (HCC)    Arthritis    bilateral knees   Hypertension    Vascular dementia (HCC)    Medications:  No history of chronic anticoagulation PTA  Assessment: 77 y.o. female with past medical history of hypertension and dementia who presents to the ED for altered mental status.  Per EMS, patient has been residing at [facility] and staff has noted increasing confusion over the past 2 to 3 days. EKG shows no evidence of arrhythmia, nonspecific ST/T abnormality noted. Pharmacy has been consulted for continuous heparin infusion.  Baseline INR 1.3, aPTT 35s, H&H, PLT wnl  Goal of Therapy:  Heparin level 0.3-0.7 units/ml Monitor platelets by anticoagulation protocol: Yes  11/01 0013 HL 0.43, therapeutic x 1 11/01 0925 HL 0.31, therapeutic x 2 11/02 0529 HL 0.33, therapeutic x 3 11/03 0545 HL 0.18, subtherapeutic 11/03 1613 HL 0.27, subtherapeutic 11/04  0059 HL 0.37, therapeutic x 1 11/04 1010 HL 0.27, SUBtherapeutic  Anti-Xa level is subtherapeutic this morning at 0.27 on 1150 units/hour. No issues with infusion reported. Hgb 10.6, plt 183. No signs/symptoms of bleeding noted.  Plan: Give heparin bolus of 700 units x1 Increase heparin infusion to 1250 units/hour Re-check Anti-Xa level in 8 hours Monitor daily Anti-Xa levels and CBC Monitor signs/symptoms of bleeding  Thank you for involving pharmacy in this patient's care.   Rockwell Alexandria, PharmD Clinical Pharmacist 01/22/2023 10:57 AM

## 2023-01-22 NOTE — Progress Notes (Signed)
                                                     Palliative Care Progress Note, Assessment & Plan   Patient Name: Kaylee Robinson       Date: 01/22/2023 DOB: 05-13-1945  Age: 77 y.o. MRN#: [id number] Attending Physician: Marcelino Duster, MD Primary Care Physician: [facility] Admit Date: 01/17/2023  Subjective: Patient is lying in bed, awake and alert.  She acknowledges my presence.  She is asking where her husband is and if there is an extra bed to sleep in.  She is not oriented to place or situation.  No family or friends present during my visit.  After viewing the chart and assessing the patient at bedside, there are no acute palliative needs at this time.  PMT remains available to patient and legal guardian throughout her hospitalization.  Samara Deist L. Bonita Quin, DNP, FNP-BC Palliative Medicine Team   No charge

## 2023-01-22 NOTE — Progress Notes (Signed)
Progress Note    01/22/2023 4:50 PM * No surgery found *  Subjective:  Kaylee Robinson is a 77 y.o. female with medical history significant of dementia, hypertension, last admission 12/07/2022 for hip fracture presented to the emergency department for evaluation of altered mental status.  Patient is [place] documented resident has been more confused for past 2 to 3 days.   On exam patient continues to have altered mental status with advanced Alzheimer's dementia.  She is unable to communicate at this time.  Palliative care and discussion with [place] DSS is for patient goals of care   Vitals:   01/22/23 0809 01/22/23 1633  BP: 104/69 91/67  Pulse: (!) 58 62  Resp: 20   Temp: 97.8 F (36.6 C) 99.1 F (37.3 C)  SpO2: 100% 100%   Physical Exam: Cardiac:  RRR, S1, S2 heard no murmurs rubs. Lungs: Clear on auscultation but diminished in the bases.  No rales rhonchi or wheezing noted. Incisions: None Extremities: Palpable pulses throughout. Abdomen: Positive bowel sounds throughout, soft, nontender nondistended. Neurologic: Advanced history of Alzheimer's dementia.  Patient unable to communicate.  CBC    Component Value Date/Time   WBC 9.1 01/22/2023 0059   RBC 3.51 (L) 01/22/2023 0059   HGB 10.6 (L) 01/22/2023 0059   HGB 17.0 (H) 05/14/2013 0951   HCT 33.3 (L) 01/22/2023 0059   HCT 50.2 (H) 05/14/2013 0951   PLT 183 01/22/2023 0059   PLT 312 05/14/2013 0951   MCV 94.9 01/22/2023 0059   MCV 91 05/14/2013 0951   MCH 30.2 01/22/2023 0059   MCHC 31.8 01/22/2023 0059   RDW 14.2 01/22/2023 0059   RDW 13.3 05/14/2013 0951   LYMPHSABS 2.6 01/17/2023 1352   LYMPHSABS 0.7 (L) 05/14/2013 0951   MONOABS 1.4 (H) 01/17/2023 1352   MONOABS 0.4 05/14/2013 0951   EOSABS 0.0 01/17/2023 1352   EOSABS 0.0 05/14/2013 0951   BASOSABS 0.1 01/17/2023 1352   BASOSABS 0.0 05/14/2013 0951    BMET    Component Value Date/Time   NA 139 01/22/2023 1010   NA 136 05/14/2013 0951   K 4.3  01/22/2023 1010   K 4.4 05/14/2013 0951   CL 109 01/22/2023 1010   CL 105 05/14/2013 0951   CO2 23 01/22/2023 1010   CO2 28 05/14/2013 0951   GLUCOSE 97 01/22/2023 1010   GLUCOSE 103 (H) 05/14/2013 0951   BUN 15 01/22/2023 1010   BUN 22 (H) 05/14/2013 0951   CREATININE 0.46 01/22/2023 1010   CREATININE 1.37 (H) 05/14/2013 0951   CALCIUM 8.5 (L) 01/22/2023 1010   CALCIUM 9.5 05/14/2013 0951   GFRNONAA >60 01/22/2023 1010   GFRNONAA 40 (L) 05/14/2013 0951   GFRAA >60 09/23/2018 0517   GFRAA 46 (L) 05/14/2013 0951    INR    Component Value Date/Time   INR 1.3 (H) 01/17/2023 1352    No intake or output data in the 24 hours ending 01/22/23 1650   Assessment/Plan:  77 y.o. female who presented to [facility] emergency department with increased confusion and altered mental status.  Upon workup patient was found to have bilateral pulmonary embolisms.* No surgery found *   PLAN: Vascular surgery recommends at this time patient be converted over to Eliquis 5 mg twice daily with no loading dose.  Patient has been on a heparin infusion for the past 5 days as palliative care was working with DSS to determine whether the patient's guardian agrees to the patient having a pulmonary thrombectomy.  At this time due to the patient's advanced dementia and confusion we recommend that the patient stay on oral anticoagulation without having any procedure.  Patient's vitals remained stable at this time.  DVT prophylaxis: Heparin infusion converted to oral Eliquis.  I discussed in detail the plan with Dr. Festus Barren MD and he agrees with plan.   Marcie Bal [facility] 01/22/2023 4:50 PM

## 2023-01-22 NOTE — Progress Notes (Signed)
On assessment pt appeared lethargic but easily aroused, extremities cool to touch, v/s WNL. Fsbs @2015  20, hypoglycemic protocol followed, orders place and 50% dextrose administered. FS Recheck at 2045, 70. On call provider made aware and orders made and commenced. Care ongoing.

## 2023-01-22 NOTE — Progress Notes (Addendum)
Nutrition Follow-up  DOCUMENTATION CODES:   Underweight, Severe malnutrition in context of chronic illness  INTERVENTION:   -Continue Nepro Shake po TID, each supplement provides 425 kcal and 19 grams protein  -Continue Magic cup TID with meals, each supplement provides 290 kcal and 9 grams of protein  -Continue MVI with minerals daily  -Feeding assistance with meals  NUTRITION DIAGNOSIS:   Severe Malnutrition related to chronic illness (dementia) as evidenced by moderate fat depletion, severe fat depletion, moderate muscle depletion, severe muscle depletion.  Ongoing  GOAL:   Patient will meet greater than or equal to 90% of their needs  Progressing   MONITOR:   PO intake, Supplement acceptance, Diet advancement  REASON FOR ASSESSMENT:   Consult Assessment of nutrition requirement/status, Poor PO  ASSESSMENT:   Pt with medical history significant of dementia, hypertension, last admission 12/07/2022 for hip fracture presented for evaluation of altered mental status.  10/31- s/p BSE- dysphagia 1 diet with nectar thick liquids   Reviewed I/O's: +1.3 L since admission  Pt lying in bed at time of visit. She was able to state she was in [place] and was waiting for her husband and mother to come walk with her. Unsuccessfully tried to reorient pt.   Palliative care following for goals of care; plan to continue anticoagulation for now. Pending decision of pulmonary thrombectomy.   Medications reviewed and include potassium chloride infusion and dextrose 5% and 0.45% NaCl infusion @ 40 ml/hr.   Labs reviewed: CBGS: 24-109 (inpatient orders for glycemic control are none).    Diet Order:   Diet Order             DIET - DYS 1 Room service appropriate? No; Fluid consistency: Nectar Thick  Diet effective now                   EDUCATION NEEDS:   No education needs have been identified at this time  Skin:  Skin Assessment: Reviewed RN Assessment  Last BM:  01/22/23 (type  6)  Height:   Ht Readings from Last 1 Encounters:  01/17/23 5\' 6"  (1.676 m)    Weight:   Wt Readings from Last 1 Encounters:  01/17/23 48 kg    Ideal Body Weight:  59.1 kg  BMI:  Body mass index is 17.08 kg/m.  Estimated Nutritional Needs:   Kcal:  1500-1700  Protein:  70-85 grams  Fluid:  > 1.5 L    Levada Schilling, RD, LDN, CDCES Registered Dietitian III Certified Diabetes Care and Education Specialist Please refer to [facility] for RD and/or RD on-call/weekend/after hours pager

## 2023-01-22 NOTE — Consult Note (Signed)
PHARMACY - ANTICOAGULATION CONSULT NOTE  Pharmacy Consult for Continuous Heparin Infusion Indication:  NSTEMI  Patient Measurements: Height: 5\' 6"  (167.6 cm) Weight: 48 kg (105 lb 13.1 oz) IBW/kg (Calculated) : 59.3 Heparin Dosing Weight: 48 kg  Labs: Recent Labs    01/20/23 0526 01/20/23 0529 01/21/23 0545 01/21/23 1613 01/22/23 0059 01/22/23 1010 01/22/23 1954  HGB  --   --  10.8*  --  10.6*  --   --   HCT  --   --  34.3*  --  33.3*  --   --   PLT  --   --  194  --  183  --   --   HEPARINUNFRC  --    < > 0.18*   < > 0.37 0.27* 0.39  CREATININE 0.52  --  0.52  --   --  0.46  --    < > = values in this interval not displayed.   Estimated Creatinine Clearance: 44.6 mL/min (by C-G formula based on SCr of 0.46 mg/dL).  Medical History: Past Medical History:  Diagnosis Date   Alzheimer disease (HCC)    Arthritis    bilateral knees   Hypertension    Vascular dementia (HCC)    Medications:  No history of chronic anticoagulation PTA  Assessment: 77 y.o. female with past medical history of hypertension and dementia who presents to the ED for altered mental status.  Per EMS, patient has been residing at [facility] and staff has noted increasing confusion over the past 2 to 3 days. EKG shows no evidence of arrhythmia, nonspecific ST/T abnormality noted. Pharmacy has been consulted for continuous heparin infusion.  Baseline INR 1.3, aPTT 35s, H&H, PLT wnl  Goal of Therapy:  Heparin level 0.3-0.7 units/ml Monitor platelets by anticoagulation protocol: Yes  11/01 0013 HL 0.43, therapeutic x 1 11/01 0925 HL 0.31, therapeutic x 2 11/02 0529 HL 0.33, therapeutic x 3 11/03 0545 HL 0.18, subtherapeutic 11/03 1613 HL 0.27, subtherapeutic 11/04 0059 HL 0.37, therapeutic x 1 11/04 1010 HL 0.27, subtherapeutic 11/04 1954 HL 0.39, therapeutic x 1  Plan: --Heparin level is therapeutic x 1 --Continue heparin infusion at 1250 units/hr --Re-check confirmatory HL tomorrow AM  along with CBC  Thank you for involving pharmacy in this patient's care.   Tressie Ellis 01/22/2023 8:31 PM

## 2023-01-23 DIAGNOSIS — F03918 Unspecified dementia, unspecified severity, with other behavioral disturbance: Secondary | ICD-10-CM | POA: Diagnosis not present

## 2023-01-23 DIAGNOSIS — E876 Hypokalemia: Secondary | ICD-10-CM | POA: Diagnosis not present

## 2023-01-23 DIAGNOSIS — E162 Hypoglycemia, unspecified: Secondary | ICD-10-CM | POA: Diagnosis not present

## 2023-01-23 DIAGNOSIS — E870 Hyperosmolality and hypernatremia: Secondary | ICD-10-CM | POA: Diagnosis not present

## 2023-01-23 LAB — BASIC METABOLIC PANEL
Anion gap: 6 (ref 5–15)
BUN: 16 mg/dL (ref 8–23)
CO2: 22 mmol/L (ref 22–32)
Calcium: 8.2 mg/dL — ABNORMAL LOW (ref 8.9–10.3)
Chloride: 105 mmol/L (ref 98–111)
Creatinine, Ser: 0.38 mg/dL — ABNORMAL LOW (ref 0.44–1.00)
GFR, Estimated: >60 mL/min (ref >60)
Glucose, Bld: 168 mg/dL — ABNORMAL HIGH (ref 70–99)
Potassium: 5.1 mmol/L (ref 3.5–5.1)
Sodium: 133 mmol/L — ABNORMAL LOW (ref 135–145)

## 2023-01-23 LAB — GLUCOSE, CAPILLARY
Glucose-Capillary: 101 mg/dL — ABNORMAL HIGH (ref 70–99)
Glucose-Capillary: 128 mg/dL — ABNORMAL HIGH (ref 70–99)
Glucose-Capillary: 145 mg/dL — ABNORMAL HIGH (ref 70–99)
Glucose-Capillary: 25 mg/dL — CL (ref 70–99)
Glucose-Capillary: 41 mg/dL — CL (ref 70–99)
Glucose-Capillary: 43 mg/dL — CL (ref 70–99)
Glucose-Capillary: 50 mg/dL — ABNORMAL LOW (ref 70–99)
Glucose-Capillary: 55 mg/dL — ABNORMAL LOW (ref 70–99)
Glucose-Capillary: 57 mg/dL — ABNORMAL LOW (ref 70–99)
Glucose-Capillary: 74 mg/dL (ref 70–99)
Glucose-Capillary: 86 mg/dL (ref 70–99)
Glucose-Capillary: <10 mg/dL — CL (ref 70–99)

## 2023-01-23 LAB — CBC
HCT: 30.6 % — ABNORMAL LOW (ref 36.0–46.0)
Hemoglobin: 10.1 g/dL — ABNORMAL LOW (ref 12.0–15.0)
MCH: 30.3 pg (ref 26.0–34.0)
MCHC: 33 g/dL (ref 30.0–36.0)
MCV: 91.9 fL (ref 80.0–100.0)
Platelets: 208 10*3/uL (ref 150–400)
RBC: 3.33 MIL/uL — ABNORMAL LOW (ref 3.87–5.11)
RDW: 14.4 % (ref 11.5–15.5)
WBC: 9 10*3/uL (ref 4.0–10.5)
nRBC: 0 % (ref 0.0–0.2)

## 2023-01-23 LAB — GLUCOSE, RANDOM: Glucose, Bld: 92 mg/dL (ref 70–99)

## 2023-01-23 MED ORDER — DEXTROSE 50 % IV SOLN
25.0000 g | Freq: Once | INTRAVENOUS | Status: AC
  Administered 2023-01-23: 25 g via INTRAVENOUS
  Filled 2023-01-23: qty 50

## 2023-01-23 MED ORDER — GLUCOSE 40 % PO GEL
1.0000 | Freq: Once | ORAL | Status: DC
Start: 2023-01-23 — End: 2023-01-23

## 2023-01-23 MED ORDER — APIXABAN 5 MG PO TABS: 5.0000 mg | ORAL_TABLET | Freq: Two times a day (BID) | ORAL | 3 refills | Status: DC

## 2023-01-23 MED ORDER — APIXABAN 5 MG PO TABS
5.0000 mg | ORAL_TABLET | Freq: Two times a day (BID) | ORAL | Status: DC
  Administered 2023-01-23: 5 mg via ORAL
  Filled 2023-01-23: qty 1

## 2023-01-23 NOTE — TOC Transition Note (Addendum)
Transition of Care [facility]) - CM/SW Discharge Note   Patient Details  Name: Jaymarie Yeakel MRN: [id number] Date of Birth: 07/05/1945  Transition of Care [facility]) CM/SW Contact:  Allena Katz, LCSW Phone Number: 01/23/2023, 10:48 AM   Clinical Narrative:   Pt has orders to discharge back to [facility]. Medical necessity printed and on chart. Guardian sentrell notified. Gavin Pound with [facility] notified. Dc summary to be sent. DC summary emailed to guardian sentrell Byrd.     Final next level of care: Skilled Nursing Facility Barriers to Discharge: Barriers Resolved   Patient Goals and CMS Choice CMS Medicare.gov Compare Post Acute Care list provided to:: Legal Guardian    Discharge Placement                Patient chooses bed at: [facility] Patient to be transferred to facility by: acems Name of family member notified: legal guardian sentrell Patient and family notified of of transfer: 01/23/23  Discharge Plan and Services Additional resources added to the After Visit Summary for                                       Social Determinants of Health (SDOH) Interventions SDOH Screenings   Food Insecurity: Patient Unable To Answer (01/19/2023)  Housing: Patient Unable To Answer (01/19/2023)  Transportation Needs: Patient Unable To Answer (01/19/2023)  Utilities: Patient Unable To Answer (01/19/2023)  Financial Resource Strain: Low Risk  (06/12/2022)   Received from [facility], [facility]  Physical Activity: Insufficiently Active (06/12/2022)   Received from [facility], [facility]  Social Connections: Moderately Integrated (06/12/2022)   Received from [facility], [facility]  Stress: Patient Unable To Answer (06/12/2022)   Received from [facility], [facility]  Tobacco Use: High Risk (01/19/2023)     Readmission Risk Interventions    01/18/2023    3:35 PM 12/13/2022   10:11 AM  Readmission Risk Prevention Plan  Transportation Screening  Complete Complete  PCP or Specialist Appt within 3-5 Days Complete Complete  HRI or Home Care Consult Complete Complete  Social Work Consult for Recovery Care Planning/Counseling Complete Complete  Palliative Care Screening Complete Not Applicable  Medication Review Oceanographer) Complete Referral to Pharmacy

## 2023-01-23 NOTE — Discharge Summary (Signed)
Physician Discharge Summary   [name] MRN: [id number] DOB: 03-10-1946  Admit date:     01/17/2023  Discharge date: 01/23/23  Discharge Physician: Marcelino Duster   PCP: [facility]   Recommendations at discharge:   PCP follow up in 1 week. Ongoing discussion with DSS regarding goals of care, need palliative follow up.  Discharge Diagnoses: Principal Problem:   Hypernatremia Active Problems:   Dementia with behavioral disturbance (HCC)   Anxiety and depression   Closed left hip fracture (HCC)   Protein-calorie malnutrition, severe   UTI (urinary tract infection)   Hypokalemia   Elevated d-dimer   Elevated troponin   Acute pulmonary embolism with acute cor pulmonale (HCC)   Sepsis (HCC)   Hypoglycemia  Resolved Problems:   * No resolved hospital problems. *  Hospital Course: Kaylee Robinson is a 77 y.o. female with medical history significant of dementia, hypertension, last admission 12/07/2022 for hip fracture presented to the emergency department for evaluation of altered mental status.  Patient is white documented resident has been more confused for past 2 to 3 days.  Patient has been not eating well as per ED provider.  During my exam she has repetitive mumbling speech and does not answer my questions.  Patient has advanced dementia but her mental status has worsened from baseline per nursing staff.   In the emergency department, her vital signs are stable, initially admitted for further management evaluation of metabolic encephalopathy secondary to possible UTI, elevated troponin. Later noted to have left lower lobe multiple occlusive emboli, at least submassive PE on CTA chest, started on heparin drip. Vascular, pulmonary consulted. Palliative involved for goals of care discussion with DSS.   Assessment and Plan: Submassive left lower lobe PE Her heparin drip per transitioned to oral Eliquis therapy. Elevated troponin and BNP in the  setting of pulmonary embolism. Echo reviewed EF 60-65% shows no RV strain. Pulmonary and vascular signed off, no acute intervention at this time, continue anticoagulation.   Acute metabolic encephalopathy: In the setting of UTI, PE. She has baseline poor mental status due to advanced dementia.  CT scan head unremarkable. Mental status better, able to answer me. Continue delirium precautions. Fall, aspiration precautions.   Hypernatremia- In the setting of poor oral intake, dehydration Improved with gentle IV fluids D5 1/2 NS.   Hypokalemia- K 5.1 today. Stopped supplementation. Monitor electrolytes as outpatient.  Hypoglycemia: Noted her blood sugars run low, though asymptomatic. Due to poor oral intake. Encourage oral diet, supplements. Continue to check blood sugars twice daily for low blood sugars.   Proteus UTI- Urine cultures reviewed. Finished IV Rocephin therapy total 5 days.   Poor oral intake Failure to thrive: Severe malnutrition: Poor oral intake due to advanced dementia. Continue dys 1 diet. She is at high risk for aspiration.     Goals of care: Palliative team discussed with DSS regarding her goals of care. Continue nursing supportive care. Her overall prognosis is poor. Continue to discuss with DSS, advise DNR, palliative care only.       Consultants: Pulmonary, Vascular Procedures performed: none  Disposition: Skilled nursing facility Diet recommendation:  Discharge Diet Orders (From admission, onward)     Start     Ordered   01/23/23 0000  Diet - low sodium heart healthy       Comments: Dysphagia 1, nectar thick liquids   01/23/23 1321           Dysphagia type 1 nectar thick Liquid DISCHARGE  MEDICATION: Allergies as of 01/23/2023       Reactions   Enalapril Other (See Comments)   Naproxen Swelling   Amoxicillin Rash   Sulfa Antibiotics Rash        Medication List     STOP taking these medications     HYDROcodone-acetaminophen 5-325 MG tablet Commonly known as: NORCO/VICODIN   nicotine 7 mg/24hr patch Commonly known as: NICODERM CQ - dosed in mg/24 hr   QUEtiapine 50 MG tablet Commonly known as: SEROQUEL       TAKE these medications    acetaminophen 325 MG tablet Commonly known as: TYLENOL Take 650 mg by mouth 3 (three) times daily.   alum & mag hydroxide-simeth 200-200-20 MG/5ML suspension Commonly known as: MAALOX/MYLANTA Take 30 mLs by mouth every 4 (four) hours as needed for indigestion.   apixaban 5 MG Tabs tablet Commonly known as: ELIQUIS Take 1 tablet (5 mg total) by mouth 2 (two) times daily.   B-12 1000 MCG Caps Take 1 capsule by mouth daily.   divalproex 250 MG DR tablet Commonly known as: DEPAKOTE Take 250 mg by mouth every 8 (eight) hours.   docusate sodium 100 MG capsule Commonly known as: COLACE Take 1 capsule (100 mg total) by mouth 2 (two) times daily.   feeding supplement Liqd Take 237 mLs by mouth 3 (three) times daily between meals.   ferrous sulfate 325 (65 FE) MG tablet Take 1 tablet (325 mg total) by mouth 3 (three) times daily after meals.   haloperidol 1 MG tablet Commonly known as: HALDOL Take 1 mg by mouth every 12 (twelve) hours as needed for agitation.   LORazepam 0.5 MG tablet Commonly known as: ATIVAN Take 0.5 mg by mouth every 8 (eight) hours as needed for anxiety.   memantine 10 MG tablet Commonly known as: NAMENDA Take 10 mg by mouth 2 (two) times daily.   multivitamin with minerals Tabs tablet Take 1 tablet by mouth daily.   polyethylene glycol 17 g packet Commonly known as: MIRALAX / GLYCOLAX Take 17 g by mouth daily as needed for mild constipation.   sertraline 50 MG tablet Commonly known as: ZOLOFT Take 50 mg by mouth daily.   sertraline 25 MG tablet Commonly known as: ZOLOFT Take 25 mg by mouth daily.   traZODone 50 MG tablet Commonly known as: DESYREL Take 50 mg by mouth at bedtime.   Vitamin  D-1000 Max St 25 MCG (1000 UT) tablet Generic drug: Cholecalciferol Take 1,000 Units by mouth daily.        Discharge Exam: Filed Weights   01/17/23 1334  Weight: 48 kg   General - Elderly thin built ill female, able to answer questions HEENT - PERRLA, EOMI, atraumatic head, non tender sinuses. Lung - Clear, rales, rhonchi, wheezes. Heart - S1, S2 heard, no murmurs, rubs, no pedal edema. Abdomen-soft, nontender, nondistended. Neuro - alert, awake, non focal neuro exam Skin - Warm and dry.  Condition at discharge: fair  The results of significant diagnostics from this hospitalization (including imaging, microbiology, ancillary and laboratory) are listed below for reference.   Imaging Studies: US Venous Img Lower Bilateral (DVT)  Result Date: 01/18/2023 CLINICAL DATA:  Known pulmonary emboli. EXAM: Bilateral LOWER EXTREMITY VENOUS DOPPLER ULTRASOUND TECHNIQUE: Gray-scale sonography with compression, as well as color and duplex ultrasound, were performed to evaluate the deep venous system(s) from the level of the common femoral vein through the popliteal and proximal calf veins. COMPARISON:  None Available. FINDINGS: VENOUS There are several  areas of poor compression and limited flow on color and spectral Doppler. This includes the left proximal femoral vein as well as the left posterior tibial and peroneal veins below-the-knee. There is preserved appearance with compression and color Doppler and spectral Doppler of the left common femoral and popliteal. On the right side there is incomplete compression of the proximal, mid and distal femoral vein with echogenic material and thrombus. This has near occlusive. More normal appearance of the right common femoral, popliteal vessels. There is some potential thrombus as well in the peroneal. OTHER None. Limitations: none IMPRESSION: Bilateral areas of DVT including bilateral femoral veins and calf veins. Electronically Signed   By: Karen Kays  M.D.   On: 01/18/2023 10:39   ECHOCARDIOGRAM COMPLETE  Result Date: 01/18/2023    ECHOCARDIOGRAM REPORT   Patient Name:   Kaylee Robinson Date of Exam: 01/17/2023 Medical Rec #:  [id number]      Height:       66.0 in Accession #:    [id number]     Weight:       105.8 lb Date of Birth:  1946-03-13      BSA:          1.526 m Patient Age:    77 years       BP:           122/78 mmHg Patient Gender: F              HR:           88 bpm. Exam Location:  [facility] Procedure: 2D Echo, Cardiac Doppler and Color Doppler Indications:     Elevated Troponin  History:         Patient has no prior history of Echocardiogram examinations.                  Risk Factors:Hypertension.  Sonographer:     Daphine Deutscher RDCS Referring Phys:  [id number] [facility] Diagnosing Phys: Mellody Drown Alluri IMPRESSIONS  1. Left ventricular ejection fraction, by estimation, is 60 to 65%. The left ventricle has normal function. The left ventricle has no regional wall motion abnormalities. Left ventricular diastolic parameters are consistent with Grade I diastolic dysfunction (impaired relaxation).  2. Right ventricular systolic function is normal. The right ventricular size is normal.  3. The mitral valve is normal in structure. Trivial mitral valve regurgitation.  4. Tricuspid valve regurgitation is mild to moderate.  5. The aortic valve is tricuspid. There is mild thickening of the aortic valve. Aortic valve regurgitation is not visualized.  6. The inferior vena cava is normal in size with greater than 50% respiratory variability, suggesting right atrial pressure of 3 mmHg. FINDINGS  Left Ventricle: Left ventricular ejection fraction, by estimation, is 60 to 65%. The left ventricle has normal function. The left ventricle has no regional wall motion abnormalities. The left ventricular internal cavity size was normal in size. There is  no left ventricular hypertrophy. Left ventricular diastolic parameters are consistent with Grade I  diastolic dysfunction (impaired relaxation). Right Ventricle: The right ventricular size is normal. No increase in right ventricular wall thickness. Right ventricular systolic function is normal. Left Atrium: Left atrial size was normal in size. Right Atrium: Right atrial size was normal in size. Pericardium: Trivial pericardial effusion is present. Mitral Valve: The mitral valve is normal in structure. Trivial mitral valve regurgitation. Tricuspid Valve: The tricuspid valve is normal in structure. Tricuspid valve regurgitation is mild to moderate. Aortic Valve: The aortic valve  is tricuspid. There is mild thickening of the aortic valve. Aortic valve regurgitation is not visualized. Pulmonic Valve: The pulmonic valve was not well visualized. Pulmonic valve regurgitation is trivial. Aorta: The aortic root is normal in size and structure. Venous: The inferior vena cava is normal in size with greater than 50% respiratory variability, suggesting right atrial pressure of 3 mmHg. IAS/Shunts: No atrial level shunt detected by color flow Doppler.  LEFT VENTRICLE PLAX 2D LVIDd:         3.80 cm   Diastology LVIDs:         2.00 cm   LV e' medial:    5.83 cm/s LV PW:         0.90 cm   LV E/e' medial:  6.0 LV IVS:        0.90 cm   LV e' lateral:   7.31 cm/s LVOT diam:     1.90 cm   LV E/e' lateral: 4.8 LV SV:         48 LV SV Index:   31 LVOT Area:     2.84 cm  RIGHT VENTRICLE          IVC RV Basal diam:  2.80 cm  IVC diam: 1.20 cm LEFT ATRIUM             Index        RIGHT ATRIUM          Index LA diam:        3.50 cm 2.29 cm/m   RA Area:     5.77 cm LA Vol (A2C):   24.5 ml 16.06 ml/m  RA Volume:   8.89 ml  5.83 ml/m LA Vol (A4C):   28.7 ml 18.81 ml/m LA Biplane Vol: 26.6 ml 17.43 ml/m  AORTIC VALVE LVOT Vmax:   110.00 cm/s LVOT Vmean:  68.550 cm/s LVOT VTI:    0.168 m  AORTA Ao Root diam: 3.20 cm MITRAL VALVE               TRICUSPID VALVE MV Area (PHT): 3.98 cm    TR Peak grad:   29.8 mmHg MV Decel Time: 191 msec    TR  Vmax:        273.00 cm/s MV E velocity: 35.00 cm/s MV A velocity: 87.00 cm/s  SHUNTS MV E/A ratio:  0.40        Systemic VTI:  0.17 m                            Systemic Diam: 1.90 cm Windell Norfolk Electronically signed by Windell Norfolk Signature Date/Time: 01/18/2023/10:23:59 AM    Final    CT Angio Chest Pulmonary Embolism (PE) W or WO Contrast  Result Date: 01/17/2023 CLINICAL DATA:  Pulmonary embolism (PE) suspected, high prob. Altered mental status EXAM: CT ANGIOGRAPHY CHEST WITH CONTRAST TECHNIQUE: Multidetector CT imaging of the chest was performed using the standard protocol during bolus administration of intravenous contrast. Multiplanar CT image reconstructions and MIPs were obtained to evaluate the vascular anatomy. RADIATION DOSE REDUCTION: This exam was performed according to the departmental dose-optimization program which includes automated exposure control, adjustment of the mA and/or kV according to patient size and/or use of iterative reconstruction technique. CONTRAST:  52mL OMNIPAQUE IOHEXOL 350 MG/ML SOLN COMPARISON:  12/25/2011 FINDINGS: Cardiovascular: Filling defects noted within the left lower lobe pulmonary arteries compatible with pulmonary emboli. Evidence of right heart strain with RV/LV ratio of 1.9.  No evidence of aortic aneurysm. Scattered aortic atherosclerosis and moderate coronary artery calcifications. Mediastinum/Nodes: No mediastinal, hilar, or axillary adenopathy. Trachea and esophagus are unremarkable. Thyroid unremarkable. Lungs/Pleura: No confluent airspace opacities or effusions. Upper Abdomen: No acute findings in the upper abdomen. Musculoskeletal: Chest wall soft tissues are unremarkable. No acute bony abnormality. Review of the MIP images confirms the above findings. IMPRESSION: Multiple occlusive pulmonary emboli in the left lower lobe. CT evidence of right heart strain (RV/LV Ratio = 1.9) consistent with at least submassive (intermediate risk) PE. The presence  of right heart strain has been associated with an increased risk of morbidity and mortality. Please refer to the "Code PE Focused" order set in EPIC. Coronary artery disease. Aortic Atherosclerosis (ICD10-I70.0). These results will be called to the ordering clinician or representative by the Radiologist Assistant, and communication documented in the PACS or [employer]. Electronically Signed   By: Charlett Nose M.D.   On: 01/17/2023 20:29   DG Chest Portable 1 View  Result Date: 01/17/2023 CLINICAL DATA:  77 year old female with altered mental status and weakness. EXAM: PORTABLE CHEST 1 VIEW COMPARISON:  Portable chest 12/06/2022 and earlier. FINDINGS: Portable AP upright view at 1434 hours. Large lung volumes. Stable cardiac size and mediastinal contours. Tortuous descending thoracic aorta. Visualized tracheal air column is within normal limits. Allowing for portable technique the lungs are clear. No pneumothorax or pleural effusion. No acute osseous abnormality identified. Negative visible bowel gas. IMPRESSION: Chronic hyperinflation.  No acute cardiopulmonary abnormality. Electronically Signed   By: Odessa Fleming M.D.   On: 01/17/2023 16:29   CT Head Wo Contrast  Result Date: 01/17/2023 CLINICAL DATA:  Altered mental status EXAM: CT HEAD WITHOUT CONTRAST TECHNIQUE: Contiguous axial images were obtained from the base of the skull through the vertex without intravenous contrast. RADIATION DOSE REDUCTION: This exam was performed according to the departmental dose-optimization program which includes automated exposure control, adjustment of the mA and/or kV according to patient size and/or use of iterative reconstruction technique. COMPARISON:  11/20/2022 FINDINGS: Brain: No evidence of acute infarction, hemorrhage, mass, mass effect, or midline shift. No hydrocephalus or extra-axial fluid collection. Periventricular white matter changes, likely the sequela of chronic small vessel ischemic disease. Age  related cerebral atrophy. Vascular: No hyperdense vessel. Atherosclerotic calcifications in the intracranial carotid and vertebral arteries. Skull: Negative for fracture or focal lesion. Sinuses/Orbits: No acute finding. Other: The mastoid air cells are well aerated. IMPRESSION: No acute intracranial process. Electronically Signed   By: Wiliam Ke M.D.   On: 01/17/2023 15:43    Microbiology: Results for orders placed or performed during the hospital encounter of 01/17/23  Urine Culture     Status: Abnormal   Collection Time: 01/17/23  1:52 PM   Specimen: Urine, Catheterized  Result Value Ref Range Status   Specimen Description   Final    URINE, CATHETERIZED Performed at [facility], [address] [place], [place] [address]    Special Requests   Final    NONE Performed at [facility], [address] [place], [place] [address]    Culture 20,000 COLONIES/mL PROTEUS MIRABILIS (A)  Final   Report Status 01/20/2023 FINAL  Final   Organism ID, Bacteria PROTEUS MIRABILIS (A)  Final      Susceptibility   Proteus mirabilis - MIC*    AMPICILLIN <=2 SENSITIVE Sensitive     CEFAZOLIN 8 SENSITIVE Sensitive     CEFEPIME <=0.12 SENSITIVE Sensitive     CEFTRIAXONE <=0.25 SENSITIVE Sensitive  CIPROFLOXACIN <=0.25 SENSITIVE Sensitive     GENTAMICIN <=1 SENSITIVE Sensitive     IMIPENEM 2 SENSITIVE Sensitive     NITROFURANTOIN 128 RESISTANT Resistant     TRIMETH/SULFA <=20 SENSITIVE Sensitive     AMPICILLIN/SULBACTAM <=2 SENSITIVE Sensitive     PIP/TAZO <=4 SENSITIVE Sensitive ug/mL    * 20,000 COLONIES/mL PROTEUS MIRABILIS  Culture, blood (routine x 2)     Status: None   Collection Time: 01/17/23  1:53 PM   Specimen: BLOOD  Result Value Ref Range Status   Specimen Description BLOOD BLOOD RIGHT WRIST  Final   Special Requests   Final    BOTTLES DRAWN AEROBIC AND ANAEROBIC Blood Culture adequate volume   Culture   Final    NO GROWTH 5 DAYS Performed at [facility], [address] [place], [place] [address]    Report Status 01/22/2023 FINAL  Final  Culture, blood (routine x 2)     Status: None   Collection Time: 01/17/23  1:58 PM   Specimen: BLOOD  Result Value Ref Range Status   Specimen Description BLOOD LEFT ANTECUBITAL  Final   Special Requests   Final    BOTTLES DRAWN AEROBIC AND ANAEROBIC Blood Culture adequate volume   Culture   Final    NO GROWTH 5 DAYS Performed at [facility], [address] [place], [place] [address]    Report Status 01/22/2023 FINAL  Final    Labs: CBC: Recent Labs  Lab 01/17/23 1352 01/18/23 0445 01/19/23 0354 01/21/23 0545 01/22/23 0059 01/23/23 0853  WBC 16.5* 13.7* 10.6* 8.8 9.1 9.0  NEUTROABS 12.3*  --   --   --   --   --   HGB 14.0 11.7* 11.0* 10.8* 10.6* 10.1*  HCT 44.0 36.4 34.7* 34.3* 33.3* 30.6*  MCV 95.0 94.1 95.9 95.8 94.9 91.9  PLT 252 212 202 194 183 208   Basic Metabolic Panel: Recent Labs  Lab 01/17/23 1352 01/17/23 2025 01/19/23 0354 01/20/23 0526 01/21/23 0545 01/22/23 1010 01/23/23 0853  NA 151*   < > 147* 146* 146* 139 133*  K 3.3*   < > 3.0* 4.0 4.6 4.3 5.1  CL 117*   < > 115* 119* 118* 109 105  CO2 24   < > 24 22 22 23 22   GLUCOSE 99   < > 121* 115* 103* 97 168*  BUN 53*   < > 31* 22 17 15 16   CREATININE 0.74   < > 0.41* 0.52 0.52 0.46 0.38*  CALCIUM 8.5*   < > 7.7* 8.0* 8.4* 8.5* 8.2*  MG 2.2  --   --   --   --   --   --    < > = values in this interval not displayed.   Liver Function Tests: Recent Labs  Lab 01/17/23 1352  AST 37  ALT 18  ALKPHOS 80  BILITOT 1.7*  PROT 6.0*  ALBUMIN 3.3*   CBG: Recent Labs  Lab 01/23/23 0435 01/23/23 0759 01/23/23 0904 01/23/23 1202 01/23/23 1214  GLUCAP 101* 43* 128* 41* 74    Discharge time spent: 38 minutes.  Signed: Marcelino Duster, MD [facility] 01/23/2023

## 2023-01-23 NOTE — Care Management Important Message (Signed)
Important Message  Patient Details  Name: [name] MRN: [id number] Date of Birth: 1945/11/22   Important Message Given:  Yes - Medicare IM  I reviewed the Important Message from Medicare with the patient's legal guardian, Janie Morning by phone [phone]) and she agrees with the discharge. She also asked me to reach out to [facility] [phone]) and I left her a message.    Olegario Messier A Callahan Peddie 01/23/2023, 2:48 PM

## 2023-01-23 NOTE — Progress Notes (Signed)
Pt report given to [employer]. All questions are addressed via phone. No any questions or concern at this time.

## 2023-01-23 NOTE — Progress Notes (Signed)
Patient is alert and oriented X 1. Pt blood sugar was 43. No any other symptoms noted. MD notified. 50% glucose given as order. Pt glucose now is 128.

## 2023-01-23 NOTE — [place] FL2 (Addendum)
[place] MEDICAID FL2 LEVEL OF CARE FORM     IDENTIFICATION  Patient Name: Kaylee Robinson Birthdate: 01/17/1946 Sex: female Admission Date (Current Location): 01/17/2023  [facility] and [place] Number:  Chiropodist and Address:  [facility], [address], [place], [place] [address]      Provider Number: [id number]  Attending Physician Name and Address:  Marcelino Duster, MD  Relative Name and Phone Number:  Bird,Sentrell (Legal Guardian)  [phone] (    Current Level of Care: Hospital Recommended Level of Care: Skilled Nursing Facility Prior Approval Number:    Date Approved/Denied:   PASRR Number: [id number]  Discharge Plan: SNF    Current Diagnoses: Patient Active Problem List   Diagnosis Date Noted   Hypoglycemia 01/23/2023   Sepsis (HCC) 01/18/2023   UTI (urinary tract infection) 01/17/2023   Hypernatremia 01/17/2023   Hypokalemia 01/17/2023   Elevated d-dimer 01/17/2023   Elevated troponin 01/17/2023   Acute pulmonary embolism with acute cor pulmonale (HCC) 01/17/2023   Closed right hip fracture (HCC) 12/07/2022   Dementia with behavioral disturbance (HCC) 12/07/2022   Anxiety and depression 12/07/2022   Protein-calorie malnutrition, severe 12/07/2022   Closed left hip fracture (HCC) 09/20/2018    Orientation RESPIRATION BLADDER Height & Weight     Self  O2 Incontinent Weight: 105 lb 13.1 oz (48 kg) Height:  5\' 6"  (167.6 cm)  BEHAVIORAL SYMPTOMS/MOOD NEUROLOGICAL BOWEL NUTRITION STATUS      Incontinent    AMBULATORY STATUS COMMUNICATION OF NEEDS Skin     Verbally Normal                       Personal Care Assistance Level of Assistance              Functional Limitations Info             SPECIAL CARE FACTORS FREQUENCY                       Contractures Contractures Info: Not present    Additional Factors Info  Code Status, Allergies Code Status Info: FULL Allergies Info:  Enalapril  Naproxen  Amoxicillin  Sulfa Antibiotics           Current Medications (01/23/2023):  This is the current hospital active medication list Current Facility-Administered Medications  Medication Dose Route Frequency Provider Last Rate Last Admin   acetaminophen (TYLENOL) tablet 650 mg  650 mg Oral Q6H PRN Marcelino Duster, MD       Or   acetaminophen (TYLENOL) suppository 650 mg  650 mg Rectal Q6H PRN Marcelino Duster, MD       apixaban (ELIQUIS) tablet 5 mg  5 mg Oral BID Rockwell Alexandria, RPH   5 mg at 01/23/23 4132   feeding supplement (NEPRO CARB STEADY) liquid 237 mL  237 mL Oral TID BM Lolita Patella B, MD   237 mL at 01/23/23 1006   multivitamin with minerals tablet 1 tablet  1 tablet Oral Daily Marcelino Duster, MD   1 tablet at 01/23/23 0811   potassium chloride (KLOR-CON) packet 40 mEq  40 mEq Oral BID Marcelino Duster, MD   40 mEq at 01/23/23 0811   senna-docusate (Senokot-S) tablet 1 tablet  1 tablet Oral QHS PRN Marcelino Duster, MD         Discharge Medications: Please see discharge summary for a list of discharge medications.  STOP taking these medications     HYDROcodone-acetaminophen 5-325 MG  tablet Commonly known as: NORCO/VICODIN    nicotine 7 mg/24hr patch Commonly known as: NICODERM CQ - dosed in mg/24 hr    QUEtiapine 50 MG tablet Commonly known as: SEROQUEL           TAKE these medications     acetaminophen 325 MG tablet Commonly known as: TYLENOL Take 650 mg by mouth 3 (three) times daily.    alum & mag hydroxide-simeth 200-200-20 MG/5ML suspension Commonly known as: MAALOX/MYLANTA Take 30 mLs by mouth every 4 (four) hours as needed for indigestion.    apixaban 5 MG Tabs tablet Commonly known as: ELIQUIS Take 1 tablet (5 mg total) by mouth 2 (two) times daily.    B-12 1000 MCG Caps Take 1 capsule by mouth daily.    divalproex 250 MG DR tablet Commonly known as: DEPAKOTE Take 250 mg by mouth every 8  (eight) hours.    docusate sodium 100 MG capsule Commonly known as: COLACE Take 1 capsule (100 mg total) by mouth 2 (two) times daily.    feeding supplement Liqd Take 237 mLs by mouth 3 (three) times daily between meals.    ferrous sulfate 325 (65 FE) MG tablet Take 1 tablet (325 mg total) by mouth 3 (three) times daily after meals.    haloperidol 1 MG tablet Commonly known as: HALDOL Take 1 mg by mouth every 12 (twelve) hours as needed for agitation.    LORazepam 0.5 MG tablet Commonly known as: ATIVAN Take 0.5 mg by mouth every 8 (eight) hours as needed for anxiety.    memantine 10 MG tablet Commonly known as: NAMENDA Take 10 mg by mouth 2 (two) times daily.    multivitamin with minerals Tabs tablet Take 1 tablet by mouth daily.    polyethylene glycol 17 g packet Commonly known as: MIRALAX / GLYCOLAX Take 17 g by mouth daily as needed for mild constipation.    sertraline 50 MG tablet Commonly known as: ZOLOFT Take 50 mg by mouth daily.    sertraline 25 MG tablet Commonly known as: ZOLOFT Take 25 mg by mouth daily.    traZODone 50 MG tablet Commonly known as: DESYREL Take 50 mg by mouth at bedtime.    Vitamin D-1000 Max St 25 MCG (1000 UT) tablet Generic drug: Cholecalciferol Take 1,000 Units by mouth daily.     Relevant Imaging Results:  Relevant Lab Results:   Additional Information SSN: [id number]  Allena Katz, LCSW

## 2023-01-23 NOTE — Progress Notes (Signed)
                                                     Palliative Care Progress Note, Assessment & Plan   Patient Name: Kaylee Robinson       Date: 01/23/2023 DOB: 1946-03-19  Age: 77 y.o. MRN#: [id number] Attending Physician: Marcelino Duster, MD Primary Care Physician: [facility] Admit Date: 01/17/2023  Chart reviewed.  Patient to be discharged to [facility] today.  No acute palliative needs at this time.  PMT remains available to patient and legal guardian throughout her hospitalization.  Please reengage with PMT if patient/legal guardian requests, if goals change, or if patient's health deteriorates prior to discharge.  Samara Deist L. Bonita Quin, DNP, FNP-BC Palliative Medicine Team  No charge

## 2023-01-23 NOTE — Progress Notes (Addendum)
Patient blood sugar was 55 no any other symptoms noted.8 oz of orange juice given. Rechecked blood sugar was 10. Rechecked again in another hands and now  blood sugar is 86.  MD made aware.

## 2023-01-23 NOTE — Progress Notes (Signed)
Patient is hypoglycemic, reported to be 57 mg/dl. Per nurse tech. she is asymptomatic.   8 oz orange juice given same tolerated. recheck meter said 25, we recheck said 50, rechecked 35. Patient had similar episode during the day. Extremities are very cold, due to poor circulation. Provider informed, 4 oz given again, provider order stat 50% glucose. Patient glucose now at 145 mg/dl.

## 2023-04-24 ENCOUNTER — Emergency Department: Payer: 59

## 2023-04-24 ENCOUNTER — Other Ambulatory Visit: Payer: Self-pay

## 2023-04-24 ENCOUNTER — Inpatient Hospital Stay
Admission: EM | Admit: 2023-04-24 | Discharge: 2023-04-29 | DRG: 871 | Disposition: A | Discharge status: Skilled Nursing Facility | Payer: 59 | Source: Skilled Nursing Facility | Attending: Internal Medicine | Admitting: Internal Medicine

## 2023-04-24 DIAGNOSIS — R6520 Severe sepsis without septic shock: Secondary | ICD-10-CM

## 2023-04-24 DIAGNOSIS — F17210 Nicotine dependence, cigarettes, uncomplicated: Secondary | ICD-10-CM | POA: Diagnosis present

## 2023-04-24 DIAGNOSIS — G309 Alzheimer's disease, unspecified: Secondary | ICD-10-CM | POA: Diagnosis present

## 2023-04-24 DIAGNOSIS — E860 Dehydration: Secondary | ICD-10-CM | POA: Diagnosis present

## 2023-04-24 DIAGNOSIS — F419 Anxiety disorder, unspecified: Secondary | ICD-10-CM | POA: Diagnosis present

## 2023-04-24 DIAGNOSIS — Z96641 Presence of right artificial hip joint: Secondary | ICD-10-CM | POA: Diagnosis present

## 2023-04-24 DIAGNOSIS — R339 Retention of urine, unspecified: Secondary | ICD-10-CM | POA: Diagnosis present

## 2023-04-24 DIAGNOSIS — Z515 Encounter for palliative care: Secondary | ICD-10-CM | POA: Diagnosis not present

## 2023-04-24 DIAGNOSIS — Z888 Allergy status to other drugs, medicaments and biological substances status: Secondary | ICD-10-CM

## 2023-04-24 DIAGNOSIS — D72829 Elevated white blood cell count, unspecified: Secondary | ICD-10-CM | POA: Diagnosis present

## 2023-04-24 DIAGNOSIS — I10 Essential (primary) hypertension: Secondary | ICD-10-CM | POA: Diagnosis present

## 2023-04-24 DIAGNOSIS — L89154 Pressure ulcer of sacral region, stage 4: Secondary | ICD-10-CM | POA: Diagnosis present

## 2023-04-24 DIAGNOSIS — Z79899 Other long term (current) drug therapy: Secondary | ICD-10-CM

## 2023-04-24 DIAGNOSIS — R6521 Severe sepsis with septic shock: Secondary | ICD-10-CM | POA: Diagnosis present

## 2023-04-24 DIAGNOSIS — Z66 Do not resuscitate: Secondary | ICD-10-CM | POA: Diagnosis not present

## 2023-04-24 DIAGNOSIS — E876 Hypokalemia: Secondary | ICD-10-CM | POA: Diagnosis present

## 2023-04-24 DIAGNOSIS — R001 Bradycardia, unspecified: Secondary | ICD-10-CM | POA: Diagnosis present

## 2023-04-24 DIAGNOSIS — Z886 Allergy status to analgesic agent status: Secondary | ICD-10-CM

## 2023-04-24 DIAGNOSIS — L89150 Pressure ulcer of sacral region, unstageable: Principal | ICD-10-CM

## 2023-04-24 DIAGNOSIS — Z86711 Personal history of pulmonary embolism: Secondary | ICD-10-CM

## 2023-04-24 DIAGNOSIS — F03918 Unspecified dementia, unspecified severity, with other behavioral disturbance: Secondary | ICD-10-CM | POA: Diagnosis present

## 2023-04-24 DIAGNOSIS — Z681 Body mass index (BMI) 19 or less, adult: Secondary | ICD-10-CM | POA: Diagnosis not present

## 2023-04-24 DIAGNOSIS — E43 Unspecified severe protein-calorie malnutrition: Secondary | ICD-10-CM | POA: Diagnosis present

## 2023-04-24 DIAGNOSIS — G9341 Metabolic encephalopathy: Secondary | ICD-10-CM | POA: Diagnosis present

## 2023-04-24 DIAGNOSIS — Z7901 Long term (current) use of anticoagulants: Secondary | ICD-10-CM

## 2023-04-24 DIAGNOSIS — R64 Cachexia: Secondary | ICD-10-CM | POA: Diagnosis present

## 2023-04-24 DIAGNOSIS — N179 Acute kidney failure, unspecified: Secondary | ICD-10-CM | POA: Diagnosis present

## 2023-04-24 DIAGNOSIS — E870 Hyperosmolality and hypernatremia: Secondary | ICD-10-CM | POA: Diagnosis present

## 2023-04-24 DIAGNOSIS — Z7189 Other specified counseling: Secondary | ICD-10-CM | POA: Diagnosis not present

## 2023-04-24 DIAGNOSIS — Z880 Allergy status to penicillin: Secondary | ICD-10-CM

## 2023-04-24 DIAGNOSIS — E871 Hypo-osmolality and hyponatremia: Secondary | ICD-10-CM | POA: Diagnosis present

## 2023-04-24 DIAGNOSIS — F01518 Vascular dementia, unspecified severity, with other behavioral disturbance: Secondary | ICD-10-CM | POA: Diagnosis present

## 2023-04-24 DIAGNOSIS — Z882 Allergy status to sulfonamides status: Secondary | ICD-10-CM

## 2023-04-24 DIAGNOSIS — F32A Depression, unspecified: Secondary | ICD-10-CM | POA: Diagnosis present

## 2023-04-24 DIAGNOSIS — L89616 Pressure-induced deep tissue damage of right heel: Secondary | ICD-10-CM | POA: Diagnosis present

## 2023-04-24 DIAGNOSIS — A419 Sepsis, unspecified organism: Principal | ICD-10-CM

## 2023-04-24 LAB — URINALYSIS, W/ REFLEX TO CULTURE (INFECTION SUSPECTED)
Bacteria, UA: NONE SEEN
Bilirubin Urine: NEGATIVE
Glucose, UA: NEGATIVE mg/dL
Hgb urine dipstick: NEGATIVE
Ketones, ur: NEGATIVE mg/dL
Leukocytes,Ua: NEGATIVE
Nitrite: NEGATIVE
Protein, ur: NEGATIVE mg/dL
Specific Gravity, Urine: 1.039 — ABNORMAL HIGH (ref 1.005–1.030)
pH: 5 (ref 5.0–8.0)

## 2023-04-24 LAB — RESP PANEL BY RT-PCR (RSV, FLU A&B, COVID)  RVPGX2
Influenza A by PCR: NEGATIVE
Influenza B by PCR: NEGATIVE
Resp Syncytial Virus by PCR: NEGATIVE
SARS Coronavirus 2 by RT PCR: NEGATIVE

## 2023-04-24 LAB — CBC WITH DIFFERENTIAL/PLATELET
Abs Immature Granulocytes: 0.2 10*3/uL — ABNORMAL HIGH (ref 0.00–0.07)
Basophils Absolute: 0.1 10*3/uL (ref 0.0–0.1)
Basophils Relative: 1 %
Eosinophils Absolute: 0 10*3/uL (ref 0.0–0.5)
Eosinophils Relative: 0 %
HCT: 45.5 % (ref 36.0–46.0)
Hemoglobin: 13.4 g/dL (ref 12.0–15.0)
Immature Granulocytes: 2 %
Lymphocytes Relative: 25 %
Lymphs Abs: 3.3 10*3/uL (ref 0.7–4.0)
MCH: 29.1 pg (ref 26.0–34.0)
MCHC: 29.5 g/dL — ABNORMAL LOW (ref 30.0–36.0)
MCV: 98.9 fL (ref 80.0–100.0)
Monocytes Absolute: 1.1 10*3/uL — ABNORMAL HIGH (ref 0.1–1.0)
Monocytes Relative: 9 %
Neutro Abs: 8.3 10*3/uL — ABNORMAL HIGH (ref 1.7–7.7)
Neutrophils Relative %: 63 %
Platelets: 317 10*3/uL (ref 150–400)
RBC: 4.6 MIL/uL (ref 3.87–5.11)
RDW: 13.5 % (ref 11.5–15.5)
WBC: 13 10*3/uL — ABNORMAL HIGH (ref 4.0–10.5)
nRBC: 0.2 % (ref 0.0–0.2)

## 2023-04-24 LAB — COMPREHENSIVE METABOLIC PANEL
ALT: 12 U/L (ref 0–44)
AST: 26 U/L (ref 15–41)
Albumin: 2.7 g/dL — ABNORMAL LOW (ref 3.5–5.0)
Alkaline Phosphatase: 53 U/L (ref 38–126)
Anion gap: 18 — ABNORMAL HIGH (ref 5–15)
BUN: 56 mg/dL — ABNORMAL HIGH (ref 8–23)
CO2: 20 mmol/L — ABNORMAL LOW (ref 22–32)
Calcium: 8.6 mg/dL — ABNORMAL LOW (ref 8.9–10.3)
Chloride: 120 mmol/L — ABNORMAL HIGH (ref 98–111)
Creatinine, Ser: 1.34 mg/dL — ABNORMAL HIGH (ref 0.44–1.00)
GFR, Estimated: 41 mL/min — ABNORMAL LOW (ref >60)
Glucose, Bld: 101 mg/dL — ABNORMAL HIGH (ref 70–99)
Potassium: 3.8 mmol/L (ref 3.5–5.1)
Sodium: 158 mmol/L — ABNORMAL HIGH (ref 135–145)
Total Bilirubin: 0.7 mg/dL (ref 0.0–1.2)
Total Protein: 6.7 g/dL (ref 6.5–8.1)

## 2023-04-24 LAB — CBG MONITORING, ED
Glucose-Capillary: 65 mg/dL — ABNORMAL LOW (ref 70–99)
Glucose-Capillary: 116 mg/dL — ABNORMAL HIGH (ref 70–99)

## 2023-04-24 LAB — PROTIME-INR
INR: 2.6 — ABNORMAL HIGH (ref 0.8–1.2)
Prothrombin Time: 28.4 s — ABNORMAL HIGH (ref 11.4–15.2)

## 2023-04-24 LAB — LACTIC ACID, PLASMA
Lactic Acid, Venous: 3.4 mmol/L (ref 0.5–1.9)
Lactic Acid, Venous: 4.4 mmol/L (ref 0.5–1.9)
Lactic Acid, Venous: 3.3 mmol/L (ref 0.5–1.9)

## 2023-04-24 LAB — MAGNESIUM: Magnesium: 2.4 mg/dL (ref 1.7–2.4)

## 2023-04-24 MED ORDER — DEXTROSE 10 % IV BOLUS
5.0000 mL/kg | Freq: Once | INTRAVENOUS | Status: AC
  Administered 2023-04-24: 240 mL via INTRAVENOUS
  Filled 2023-04-24: qty 500

## 2023-04-24 MED ORDER — SERTRALINE HCL 50 MG PO TABS: 25.0000 mg | ORAL_TABLET | Freq: Every day | ORAL | Status: DC

## 2023-04-24 MED ORDER — ALUM & MAG HYDROXIDE-SIMETH 200-200-20 MG/5ML PO SUSP: 30.0000 mL | ORAL | Status: DC | PRN

## 2023-04-24 MED ORDER — SODIUM CHLORIDE 0.9 % IV BOLUS
500.0000 mL | Freq: Once | INTRAVENOUS | Status: AC
  Administered 2023-04-24: 500 mL via INTRAVENOUS

## 2023-04-24 MED ORDER — SODIUM CHLORIDE 0.9 % IV BOLUS
1000.0000 mL | Freq: Once | INTRAVENOUS | Status: AC
  Administered 2023-04-24: 1000 mL via INTRAVENOUS

## 2023-04-24 MED ORDER — SERTRALINE HCL 50 MG PO TABS
100.0000 mg | ORAL_TABLET | Freq: Every day | ORAL | Status: DC
  Administered 2023-04-25: 100 mg via ORAL
  Filled 2023-04-24: qty 2

## 2023-04-24 MED ORDER — ADULT MULTIVITAMIN W/MINERALS CH
1.0000 | ORAL_TABLET | Freq: Every day | ORAL | Status: DC
  Administered 2023-04-24: 1 via ORAL
  Filled 2023-04-24 (×2): qty 1

## 2023-04-24 MED ORDER — ONDANSETRON HCL 4 MG PO TABS: 4.0000 mg | ORAL_TABLET | Freq: Four times a day (QID) | ORAL | Status: DC | PRN

## 2023-04-24 MED ORDER — LORAZEPAM 0.5 MG PO TABS
0.5000 mg | ORAL_TABLET | Freq: Three times a day (TID) | ORAL | Status: DC | PRN
  Administered 2023-04-28: 0.5 mg via ORAL
  Filled 2023-04-24: qty 1

## 2023-04-24 MED ORDER — HALOPERIDOL 1 MG PO TABS: 1.0000 mg | ORAL_TABLET | Freq: Two times a day (BID) | ORAL | Status: DC | PRN

## 2023-04-24 MED ORDER — VANCOMYCIN HCL IN DEXTROSE 1-5 GM/200ML-% IV SOLN
1000.0000 mg | Freq: Once | INTRAVENOUS | Status: AC
  Administered 2023-04-24: 1000 mg via INTRAVENOUS
  Filled 2023-04-24: qty 200

## 2023-04-24 MED ORDER — POLYETHYLENE GLYCOL 3350 17 G PO PACK: 17.0000 g | PACK | Freq: Every day | ORAL | Status: DC | PRN

## 2023-04-24 MED ORDER — SODIUM CHLORIDE 0.45 % IV SOLN: INTRAVENOUS | Status: AC

## 2023-04-24 MED ORDER — DOCUSATE SODIUM 100 MG PO CAPS
100.0000 mg | ORAL_CAPSULE | Freq: Two times a day (BID) | ORAL | Status: DC
  Administered 2023-04-25 – 2023-04-29 (×4): 100 mg via ORAL
  Filled 2023-04-24 (×6): qty 1

## 2023-04-24 MED ORDER — ACETAMINOPHEN 325 MG PO TABS: 650.0000 mg | ORAL_TABLET | Freq: Four times a day (QID) | ORAL | Status: DC | PRN

## 2023-04-24 MED ORDER — IOHEXOL 300 MG/ML  SOLN
100.0000 mL | Freq: Once | INTRAMUSCULAR | Status: AC | PRN
  Administered 2023-04-24: 100 mL via INTRAVENOUS

## 2023-04-24 MED ORDER — TRAZODONE HCL 50 MG PO TABS
50.0000 mg | ORAL_TABLET | Freq: Every day | ORAL | Status: DC
  Administered 2023-04-24: 50 mg via ORAL
  Filled 2023-04-24: qty 1

## 2023-04-24 MED ORDER — SERTRALINE HCL 50 MG PO TABS: 50.0000 mg | ORAL_TABLET | Freq: Every day | ORAL | Status: DC

## 2023-04-24 MED ORDER — HYDROMORPHONE HCL 1 MG/ML IJ SOLN: 0.5000 mg | INTRAMUSCULAR | Status: DC | PRN

## 2023-04-24 MED ORDER — VANCOMYCIN VARIABLE DOSE PER UNSTABLE RENAL FUNCTION (PHARMACIST DOSING): Status: DC

## 2023-04-24 MED ORDER — PIPERACILLIN-TAZOBACTAM 3.375 G IVPB
3.3750 g | Freq: Three times a day (TID) | INTRAVENOUS | Status: DC
  Administered 2023-04-24 – 2023-04-26 (×6): 3.375 g via INTRAVENOUS
  Filled 2023-04-24 (×6): qty 50

## 2023-04-24 MED ORDER — ENSURE ENLIVE PO LIQD
237.0000 mL | Freq: Three times a day (TID) | ORAL | Status: DC
  Administered 2023-04-26 – 2023-04-29 (×8): 237 mL via ORAL

## 2023-04-24 MED ORDER — ACETAMINOPHEN 325 MG PO TABS
650.0000 mg | ORAL_TABLET | Freq: Three times a day (TID) | ORAL | Status: DC
  Administered 2023-04-24 – 2023-04-29 (×15): 650 mg via ORAL
  Filled 2023-04-24 (×15): qty 2

## 2023-04-24 MED ORDER — APIXABAN 5 MG PO TABS
5.0000 mg | ORAL_TABLET | Freq: Two times a day (BID) | ORAL | Status: DC
Start: 2023-04-24 — End: 2023-04-25
  Administered 2023-04-24 – 2023-04-25 (×2): 5 mg via ORAL
  Filled 2023-04-24 (×2): qty 1

## 2023-04-24 MED ORDER — MEMANTINE HCL 10 MG PO TABS
10.0000 mg | ORAL_TABLET | Freq: Two times a day (BID) | ORAL | Status: DC
  Administered 2023-04-24 – 2023-04-29 (×9): 10 mg via ORAL
  Filled 2023-04-24: qty 1
  Filled 2023-04-24 (×2): qty 2
  Filled 2023-04-24 (×4): qty 1
  Filled 2023-04-24: qty 2
  Filled 2023-04-24: qty 1

## 2023-04-24 MED ORDER — ONDANSETRON HCL 4 MG/2ML IJ SOLN: 4.0000 mg | Freq: Four times a day (QID) | INTRAMUSCULAR | Status: DC | PRN

## 2023-04-24 MED ORDER — METRONIDAZOLE 500 MG/100ML IV SOLN
500.0000 mg | Freq: Once | INTRAVENOUS | Status: AC
  Administered 2023-04-24: 500 mg via INTRAVENOUS
  Filled 2023-04-24: qty 100

## 2023-04-24 MED ORDER — SODIUM CHLORIDE 0.9 % IV SOLN
2.0000 g | Freq: Once | INTRAVENOUS | Status: AC
  Administered 2023-04-24: 2 g via INTRAVENOUS
  Filled 2023-04-24: qty 12.5

## 2023-04-24 MED ORDER — ACETAMINOPHEN 650 MG RE SUPP: 650.0000 mg | Freq: Four times a day (QID) | RECTAL | Status: DC | PRN

## 2023-04-24 NOTE — Consult Note (Signed)
 CODE SEPSIS - PHARMACY COMMUNICATION  **Broad-spectrum antimicrobials should be administered within one hour of sepsis diagnosis**  Time Code Sepsis call or page was received: 1210  Antibiotics ordered: Cefepime , Vancomycin , Metronidazole   Time of first antibiotic administration: 1234  Additional action taken by pharmacy: N/A  If necessary, name of provider/nurse contacted: N/A    Will M. Lenon, PharmD Clinical Pharmacist 04/24/2023 12:21 PM

## 2023-04-24 NOTE — TOC CM/SW Note (Addendum)
 Transition of Care [facility]) - Inpatient Brief Assessment   Patient Details  Name: Kaylee Robinson MRN: [id number] Date of Birth: 1945-11-21  [facility] of [facility] CM/SW Contact:    Silvano Molt, LCSW Phone Number: 04/24/2023, 5:27 PM   Clinical Narrative:  CSW completed chart review and reached out to DSS, Candace to get status of pt. Candace informed CSW she will have Child Psychotherapist to f/u with CSW. CSW received a call from [place], [place] [employer] Supervisor [phone]) who reports she is filling in for the Social Worker that is over pt's case. CSW and Sari discussed current status of pt and dispo. CSW discussed reason for admission and stage IV sacral wound. Also noted attending has requested palliative consult. Discussed code status and encouraged Sari to further discuss with Palliative team to aid in decision making. CSW  completed High Risk Re-admission Assessment.   Transition of Care Asessment: Insurance and Status: Insurance coverage has been reviewed Patient has primary care physician: Yes Home environment has been reviewed: Pt LTC resident at [facility].   Prior/Current Home Services: No current home services Social Drivers of Health Review: SDOH reviewed no interventions necessary Readmission risk has been reviewed: Yes Transition of care needs: no transition of care needs at this time

## 2023-04-24 NOTE — ED Provider Notes (Addendum)
 [facility] Provider Note    Event Date/Time   First MD Initiated Contact with Patient 04/24/23 1133     (approximate)   History   Hypotension   HPI  Kaylee Robinson is a 78 year old female with history of dementia, HTN, PE on Eliquis  presenting to the emergency department for evaluation of sacral wound.  Hypotensive with EMS with SBP in the 70s.  Axillary temp of 100.8.  Patient unable to provide history here, moaning.    Physical Exam   Triage Vital Signs: ED Triage Vitals  Encounter Vitals Group     BP 04/24/23 1139 94/70     Systolic BP Percentile --      Diastolic BP Percentile --      Pulse Rate 04/24/23 1139 88     Resp 04/24/23 1139 (!) 27     Temp --      Temp src --      SpO2 04/24/23 1139 100 %     Weight 04/24/23 1135 105 lb 13.1 oz (48 kg)     Height 04/24/23 1135 5' 6 (1.676 m)     Head Circumference --      Peak Flow --      Pain Score --      Pain Loc --      Pain Education --      Exclude from Growth Chart --     Most recent vital signs: Vitals:   04/24/23 1430 04/24/23 1455  BP: (!) 91/59 (!) 85/57  Pulse: 60   Resp: 11 11  Temp:    SpO2: 98%      General: Awake, not interacting CV:  Regular rate, good peripheral perfusion.  Resp:  Unlabored respirations, lungs clear to auscultation Abd:  Nondistended, no appreciable tenderness palpation Neuro:  No gross facial asymmetry, moving extremity spontaneously Skin:   Large area of skin breakdown over the lower back with associated warmth with surrounding erythema, see image below     ED Results / Procedures / Treatments   Labs (all labs ordered are listed, but only abnormal results are displayed) Labs Reviewed  COMPREHENSIVE METABOLIC PANEL - Abnormal; Notable for the following components:      Result Value   Sodium 158 (*)    Chloride 120 (*)    CO2 20 (*)    Glucose, Bld 101 (*)    BUN 56 (*)    Creatinine, Ser 1.34 (*)    Calcium 8.6 (*)    Albumin 2.7  (*)    GFR, Estimated 41 (*)    Anion gap 18 (*)    All other components within normal limits  LACTIC ACID, PLASMA - Abnormal; Notable for the following components:   Lactic Acid, Venous 3.4 (*)    All other components within normal limits  LACTIC ACID, PLASMA - Abnormal; Notable for the following components:   Lactic Acid, Venous 4.4 (*)    All other components within normal limits  CBC WITH DIFFERENTIAL/PLATELET - Abnormal; Notable for the following components:   WBC 13.0 (*)    MCHC 29.5 (*)    Neutro Abs 8.3 (*)    Monocytes Absolute 1.1 (*)    Abs Immature Granulocytes 0.20 (*)    All other components within normal limits  PROTIME-INR - Abnormal; Notable for the following components:   Prothrombin Time 28.4 (*)    INR 2.6 (*)    All other components within normal limits  CBG MONITORING, ED - Abnormal; Notable  for the following components:   Glucose-Capillary 65 (*)    All other components within normal limits  CBG MONITORING, ED - Abnormal; Notable for the following components:   Glucose-Capillary 116 (*)    All other components within normal limits  RESP PANEL BY RT-PCR (RSV, FLU A&B, COVID)  RVPGX2  CULTURE, BLOOD (ROUTINE X 2)  CULTURE, BLOOD (ROUTINE X 2)  MAGNESIUM   URINALYSIS, W/ REFLEX TO CULTURE (INFECTION SUSPECTED)  LACTIC ACID, PLASMA     EKG EKG independently reviewed interpreted by myself (ER attending) demonstrates:  EKG demonstrates sinus rhythm rate of 82, PR 59, QRS 92, QTc 546, no acute ST changes  RADIOLOGY Imaging independently reviewed and interpreted by myself demonstrates:  CT head without acute bleed CT abdomen pelvis with focal soft tissue defect without findings of osteomyelitis  PROCEDURES:  Critical Care performed: Yes, see critical care procedure note(s)  CRITICAL CARE Performed by: Nilsa Dade   Total critical care time: 32 minutes  Critical care time was exclusive of separately billable procedures and treating other  patients.  Critical care was necessary to treat or prevent imminent or life-threatening deterioration.  Critical care was time spent personally by me on the following activities: development of treatment plan with patient and/or surrogate as well as nursing, discussions with consultants, evaluation of patient's response to treatment, examination of patient, obtaining history from patient or surrogate, ordering and performing treatments and interventions, ordering and review of laboratory studies, ordering and review of radiographic studies, pulse oximetry and re-evaluation of patient's condition.   Procedures   MEDICATIONS ORDERED IN ED: Medications  sodium chloride  0.9 % bolus 500 mL (has no administration in time range)  dextrose  (D10W) 10% bolus 240 mL (0 mLs Intravenous Stopped 04/24/23 1458)  sodium chloride  0.9 % bolus 1,000 mL (0 mLs Intravenous Stopped 04/24/23 1458)  ceFEPIme  (MAXIPIME ) 2 g in sodium chloride  0.9 % 100 mL IVPB (0 g Intravenous Stopped 04/24/23 1458)  metroNIDAZOLE  (FLAGYL ) IVPB 500 mg (0 mg Intravenous Stopped 04/24/23 1458)  vancomycin  (VANCOCIN ) IVPB 1000 mg/200 mL premix (0 mg Intravenous Stopped 04/24/23 1458)  iohexol  (OMNIPAQUE ) 300 MG/ML solution 100 mL (100 mLs Intravenous Contrast Given 04/24/23 1311)     IMPRESSION / MDM / ASSESSMENT AND PLAN / ED COURSE  I reviewed the triage vital signs and the nursing notes.  Differential diagnosis includes, but is not limited to, infected sacral wound, pneumonia, UTI, anemia, electrolyte abnormality  Patient's presentation is most consistent with acute presentation with potential threat to life or bodily function.  78 year old female presenting to the emergency department for evaluation of sacral wound and low blood pressure.  BP improved here.  Borderline rectal temp at 100.2.  Sepsis orders initiated with empiric broad-spectrum antibiotics, 1 L IV fluid.  Labs with leukocytosis with WBC of 13. Hypernatremia with significant  AKI with creatinine of 1.34 with baseline around 0.4.  Also with anion gap acidosis with normal glucose, possibly starvation ketoacidosis.  Borderline low glucose of 65, given D10 bolus.  Lactate elevated at 3.4.  CT head without acute findings.  CT abdomen pelvis demonstrates soft tissue findings over the sacral region as noted on exam, no obvious osteomyelitis.  However, with suspicion for sepsis secondary to sacral wound, do think patient is appropriate for admission.  Will reach out to hospitalist team.  Sepsis reassessment performed at 1450.  Case reviewed with Dr. Laurita.  He will evaluate patient for anticipated admission.  Shortly after my discussion with Dr. Laurita, notified that patient had  increase in lactate to 4.4.  Ordered for an additional 500 cc of fluid for greater than 30 cc/kg.  Continue plan for admission.      FINAL CLINICAL IMPRESSION(S) / ED DIAGNOSES   Final diagnoses:  Pressure injury of sacral region, unstageable (HCC)  AKI (acute kidney injury) (HCC)  Hypernatremia     Rx / DC Orders   ED Discharge Orders     None        Note:  This document was prepared using Dragon voice recognition software and may include unintentional dictation errors.   Levander Slate, MD 04/24/23 1459    Levander Slate, MD 04/24/23 [id number]

## 2023-04-24 NOTE — Consult Note (Signed)
 Pharmacy Antibiotic Note  ASSESSMENT: 78 y.o. female with PMH including dementia, HTN, PE (on Eliquis ) is presenting with  sepsis secondary to sacral wound . She is a resident at [facility], and reportedly her sacral wound is stage 4. Pt is febrile and hypotensive; also with leukocytosis. She is in AKI. Pharmacy has been consulted to manage Zosyn  and vancomycin  dosing.  Patient measurements: Height: 5' 6 (167.6 cm) Weight: 48 kg (105 lb 13.1 oz) IBW/kg (Calculated) : 59.3  Vital signs: Temp: 100.2 F (37.9 C) (02/04 1146) BP: 95/58 (02/04 1500) Pulse Rate: 60 (02/04 1430) Recent Labs  Lab 04/24/23 1154  WBC 13.0*  CREATININE 1.34*   Estimated Creatinine Clearance: 26.6 mL/min (A) (by C-G formula based on SCr of 1.34 mg/dL (H)).  Allergies: Allergies  Allergen Reactions   Enalapril Other (See Comments)   Naproxen Swelling   Amoxicillin  Rash   Sulfa Antibiotics Rash    Antimicrobials this admission: Cefepime  2/4 x 1 Flagyl  2/4 x 1 Vancomycin  2/4 >> Zosyn  2/4 >>  Dose adjustments this admission: N/A  Microbiology results: 2/4 BCx: sent 2/4 Respiratory panel PCR: negative  PLAN: Initiate Zosyn  3.375 g IV q8H Patient received vancomycin  1000 mg IV x 1 as a loading dose with an estimated . With her current renal function, vancomycin  half-life ~26 hours. Will obtain 24-hour vancomycin  random before ordering further doses. Follow up culture results to assess for antibiotic optimization. Monitor renal function to assess for any necessary antibiotic dosing changes.   Thank you for allowing pharmacy to be a part of this patient's care.  Will M. Lenon, PharmD Clinical Pharmacist 04/24/2023 3:34 PM

## 2023-04-24 NOTE — H&P (Signed)
 History and Physical    Kaylee Robinson FMW:[id number] DOB: [birth year] DOA: 04/24/2023  PCP: [facility] (Confirm with patient/family/NH records and if not entered, this has to be entered at [facility] point of entry) Patient coming from: SNF  I have personally briefly reviewed patient's old medical records in [facility]  Chief Complaint: AMS, worsening of sacral  HPI: Kaylee Robinson is a 78 y.o. female with medical history significant of advanced dementia, HTN, anxiety/depression, chronic sacral wounds, PE on Eliquis , sent from nursing home for evaluation of hypotension and worsening of sacral wound.  Patient is confused unable to provide any history, all history provided by nursing home staff who is familiar with patient care as well as the wound care nurse and nursing home.  According to the nurse at [facility] nursing home, patient is only oriented to herself at baseline and unable to perform any meaningful conversation.  And last few days patient has had worsening of mentations.  Nursing home staff also concerned about patient p.o. intake, appears that the patient has been taking only 10 to 20% of her meals every day for at least 2 weeks, and a conversation to nursing home staff and the supervisor as well as patient's guardian regarding change of her CODE STATUS to DNR was performed last week and now patient is still full code.  Wound care nurse reported that patient has had chronic sacral wound for at least 2 months for which she has been following with wound care team including a physician.  2 weeks ago patient went 1 round of 7 days of p.o. doxycycline , however after completion the wounds still appeared to be infected and 4 days ago on Saturday, wound care physician performed a debridement and started patient on recommendation of antibiotics including Cipro, Zyvox and Flagyl  based on the wound culture result.  Today, it was found the patient became hypotensive and hard to  arouse.  ED Course: Temperature 100.2, borderline bradycardia and blood pressure 90/70 O2 saturation 100% room air.  CT head negative for acute findings CT abdomen pelvis showed no underlying bony erosions but soft tissue defect in the right perineum and gluteal area and 1.5 x 3.3 cm fluid collection in the subcutaneous tissue right upper gluteal area nonspecific.  Blood work showed sodium 158, chloride 120, BUN 56, creatinine 1.3, albumin 2.7, lactic acid 3.4.  WBC 13.0.  Patient was concerned vancomycin  and cefepime  in the ED.  Review of Systems: Unable to perform, patient is confused.   Past Medical History:  Diagnosis Date   Alzheimer disease (HCC)    Arthritis    bilateral knees   Hypertension    Vascular dementia [facility])     Past Surgical History:  Procedure Laterality Date   HIP ARTHROPLASTY Right 12/07/2022   Procedure: ARTHROPLASTY BIPOLAR HIP (HEMIARTHROPLASTY);  Surgeon: Edie Norleen PARAS, MD;  Location: [facility] ORS;  Service: Orthopedics;  Laterality: Right;   HIP SURGERY     INTRAMEDULLARY (IM) NAIL INTERTROCHANTERIC Left 09/20/2018   Procedure: INTRAMEDULLARY (IM) NAIL INTERTROCHANTRIC;  Surgeon: Marchia Drivers, MD;  Location: [facility] ORS;  Service: Orthopedics;  Laterality: Left;   TONSILLECTOMY     adnoidectomy     reports that she has been smoking cigarettes. She has never used smokeless tobacco. She reports that she does not drink alcohol and does not use drugs.  Allergies  Allergen Reactions   Enalapril Other (See Comments)   Naproxen Swelling   Amoxicillin  Rash   Sulfa Antibiotics Rash  Family History  Problem Relation Age of Onset   Breast cancer Neg Hx      Prior to Admission medications   Medication Sig Start Date End Date Taking? Authorizing Provider  acetaminophen  (TYLENOL ) 325 MG tablet Take 650 mg by mouth 3 (three) times daily. 11/21/22   [provider]  alum & mag hydroxide-simeth (MAALOX/MYLANTA) 200-200-20 MG/5ML suspension Take 30 mLs by  mouth every 4 (four) hours as needed for indigestion. 09/23/18   Kathrene Almarie Bake, NP  apixaban  (ELIQUIS ) 5 MG TABS tablet Take 1 tablet (5 mg total) by mouth 2 (two) times daily. 01/23/23   Darci Pore, MD  Cyanocobalamin (B-12) 1000 MCG CAPS Take 1 capsule by mouth daily. 09/28/22   [provider]  divalproex (DEPAKOTE) 250 MG DR tablet Take 250 mg by mouth every 8 (eight) hours. 10/23/22   [provider]  docusate sodium  (COLACE) 100 MG capsule Take 1 capsule (100 mg total) by mouth 2 (two) times daily. 09/23/18   Kathrene Almarie Bake, NP  feeding supplement (ENSURE ENLIVE / ENSURE PLUS) LIQD Take 237 mLs by mouth 3 (three) times daily between meals. 12/13/22   Awanda City, MD  ferrous sulfate  325 (65 FE) MG tablet Take 1 tablet (325 mg total) by mouth 3 (three) times daily after meals. 09/23/18   Kathrene Almarie Bake, NP  haloperidol  (HALDOL ) 1 MG tablet Take 1 mg by mouth every 12 (twelve) hours as needed for agitation. 10/19/22   [provider]  LORazepam  (ATIVAN ) 0.5 MG tablet Take 0.5 mg by mouth every 8 (eight) hours as needed for anxiety. 01/11/23   [provider]  memantine  (NAMENDA ) 10 MG tablet Take 10 mg by mouth 2 (two) times daily. 10/23/22   [provider]  Multiple Vitamin (MULTIVITAMIN WITH MINERALS) TABS tablet Take 1 tablet by mouth daily. 12/14/22   Awanda City, MD  polyethylene glycol (MIRALAX  / GLYCOLAX ) 17 g packet Take 17 g by mouth daily as needed for mild constipation. 09/23/18   Kathrene Almarie Bake, NP  sertraline  (ZOLOFT ) 25 MG tablet Take 25 mg by mouth daily. 01/11/23   [provider]  sertraline  (ZOLOFT ) 50 MG tablet Take 50 mg by mouth daily. 09/12/18   [provider]  traZODone  (DESYREL ) 50 MG tablet Take 50 mg by mouth at bedtime. 10/23/22   [provider]  VITAMIN D-1000 MAX ST 25 MCG (1000 UT) tablet Take 1,000 Units by mouth daily. 09/28/22   [provider]    Physical  Exam: Vitals:   04/24/23 1415 04/24/23 1430 04/24/23 1455 04/24/23 1500  BP: (!) 99/59 (!) 91/59 (!) 85/57 (!) 95/58  Pulse: 60 60    Resp: 14 11 11 14   Temp:      SpO2: 98% 98%    Weight:      Height:        Constitutional: NAD, calm, comfortable Vitals:   04/24/23 1415 04/24/23 1430 04/24/23 1455 04/24/23 1500  BP: (!) 99/59 (!) 91/59 (!) 85/57 (!) 95/58  Pulse: 60 60    Resp: 14 11 11 14   Temp:      SpO2: 98% 98%    Weight:      Height:       Eyes: PERRL, lids and conjunctivae normal ENMT: Mucous membranes are dray. Posterior pharynx clear of any exudate or lesions.Normal dentition.  Neck: normal, supple, no masses, no thyromegaly Respiratory: clear to auscultation bilaterally, no wheezing, no crackles. Normal respiratory effort. No accessory muscle use.  Cardiovascular: Regular rate and rhythm, no murmurs / rubs / gallops. No extremity edema. 2+ pedal pulses. No carotid bruits.  Abdomen: no tenderness, no masses palpated. No hepatosplenomegaly. Bowel sounds positive.  Musculoskeletal: no clubbing / cyanosis. No joint deformity upper and lower extremities. Good ROM, no contractures. Normal muscle tone.  Skin: Large sacral wound with significant tissue loss showing the picture with rash around and feels the bottom Neurologic: No facial droops, moving all limbs, not following commands Psychiatric: Arousable to voice, confused    Labs on Admission: I have personally reviewed following labs and imaging studies  CBC: Recent Labs  Lab 04/24/23 1154  WBC 13.0*  NEUTROABS 8.3*  HGB 13.4  HCT 45.5  MCV 98.9  PLT 317   Basic Metabolic Panel: Recent Labs  Lab 04/24/23 1154  NA 158*  K 3.8  CL 120*  CO2 20*  GLUCOSE 101*  BUN 56*  CREATININE 1.34*  CALCIUM 8.6*  MG 2.4   GFR: Estimated Creatinine Clearance: 26.6 mL/min (A) (by C-G formula based on SCr of 1.34 mg/dL (H)). Liver Function Tests: Recent Labs  Lab 04/24/23 1154  AST 26  ALT 12  ALKPHOS 53   BILITOT 0.7  PROT 6.7  ALBUMIN 2.7*   No results for input(s): LIPASE, AMYLASE in the last 168 hours. No results for input(s): AMMONIA in the last 168 hours. Coagulation Profile: Recent Labs  Lab 04/24/23 1154  INR 2.6*   Cardiac Enzymes: No results for input(s): CKTOTAL, CKMB, CKMBINDEX, TROPONINI in the last 168 hours. BNP (last 3 results) No results for input(s): PROBNP in the last 8760 hours. HbA1C: No results for input(s): HGBA1C in the last 72 hours. CBG: Recent Labs  Lab 04/24/23 1151 04/24/23 1336  GLUCAP 65* 116*   Lipid Profile: No results for input(s): CHOL, HDL, LDLCALC, TRIG, CHOLHDL, LDLDIRECT in the last 72 hours. Thyroid  Function Tests: No results for input(s): TSH, T4TOTAL, FREET4, T3FREE, THYROIDAB in the last 72 hours. Anemia Panel: No results for input(s): VITAMINB12, FOLATE, FERRITIN, TIBC, IRON, RETICCTPCT in the last 72 hours. Urine analysis:    Component Value Date/Time   COLORURINE AMBER (A) 01/17/2023 1352   APPEARANCEUR CLOUDY (A) 01/17/2023 1352   APPEARANCEUR Hazy 05/14/2013 0951   LABSPEC 1.027 01/17/2023 1352   LABSPEC 1.026 05/14/2013 0951   PHURINE 5.0 01/17/2023 1352   GLUCOSEU NEGATIVE 01/17/2023 1352   GLUCOSEU Negative 05/14/2013 0951   HGBUR NEGATIVE 01/17/2023 1352   BILIRUBINUR NEGATIVE 01/17/2023 1352   BILIRUBINUR Negative 05/14/2013 0951   KETONESUR 5 (A) 01/17/2023 1352   PROTEINUR 30 (A) 01/17/2023 1352   NITRITE NEGATIVE 01/17/2023 1352   LEUKOCYTESUR MODERATE (A) 01/17/2023 1352   LEUKOCYTESUR 2+ 05/14/2013 0951    Radiological Exams on Admission: CT ABDOMEN PELVIS W CONTRAST Result Date: 04/24/2023 CLINICAL DATA:  Sepsis.  Deep sacral wound. EXAM: CT ABDOMEN AND PELVIS WITH CONTRAST TECHNIQUE: Multidetector CT imaging of the abdomen and pelvis was performed using the standard protocol following bolus administration of intravenous contrast. RADIATION DOSE  REDUCTION: This exam was performed according to the departmental dose-optimization program which includes automated exposure control, adjustment of the mA and/or kV according to patient size and/or use of iterative reconstruction technique. CONTRAST:  OMNIPAQUE  IOHEXOL  300 MG/ML  SOLN COMPARISON:  CT scan abdomen and pelvis from 10/19/2009. FINDINGS: Lower chest: The lung bases are clear. No pleural effusion. The heart is normal in size. No pericardial effusion. Hepatobiliary: The liver is normal in size. Non-cirrhotic configuration. No suspicious mass. These  is mild diffuse hepatic steatosis. No intrahepatic or extrahepatic bile duct dilation. No calcified gallstones. Normal gallbladder wall thickness. No pericholecystic inflammatory changes. Pancreas: Unremarkable. No pancreatic ductal dilatation or surrounding inflammatory changes. Spleen: Within normal limits. No focal lesion. Adrenals/Urinary Tract: Adrenal glands are unremarkable. No suspicious renal mass. There is a 8 x 11 mm simple cyst in the left kidney upper pole. No hydronephrosis. No renal or ureteric calculi. PLEASE NOTE THAT THE EVALUATION OF PELVIC VISCERA IS MILDLY LIMITED DUE TO STREAK ARTIFACTS FROM RIGHT HIP PROSTHESIS AND LEFT PROXIMAL FEMUR HARDWARE. Grossly unremarkable urinary bladder. Stomach/Bowel: No disproportionate dilation of the small or large bowel loops. No evidence of abnormal bowel wall thickening or inflammatory changes. The appendix was not visualized; however there is no acute inflammatory process in the right lower quadrant. There are multiple diverticula throughout the colon, without imaging signs of diverticulitis. Vascular/Lymphatic: No ascites or pneumoperitoneum. No abdominal or pelvic lymphadenopathy, by size criteria. No aneurysmal dilation of the major abdominal arteries. There are moderate peripheral atherosclerotic vascular calcifications of the aorta and its major branches. Reproductive: Evaluation of  reproductive organs is markedly limited due to extensive streak artifacts. There is lobulated area in the pelvis which corresponds to patient's known pre-existing fundal leiomyoma. No large adnexal mass seen. Other: There is an approximately 1.5 x 3.3 cm fluid collection in the subcutaneous tissue over the right upper gluteal region, laterally, nonspecific but may represent sequela of prior hemorrhage or infection. There is focal soft tissue defect in the right paramedian gluteal region at the level of mid sacrum. There is associated nonspecific fluid collection and small amount of air in the subcutaneous fat. However, no discrete underlying sacral bone erosions noted. Musculoskeletal: No suspicious osseous lesions. There are moderate multilevel degenerative changes in the visualized spine. There is subtle superior endplate compression deformity of L3 vertebral body without significant retropulsion or spinal canal compromise. No associated surrounding fat stranding. This is of indeterminate age but likely chronic. IMPRESSION: 1. There is focal soft tissue defect in the right paramedian gluteal region at the level of mid sacrum without underlying bone erosions. Findings favor sacral decubitus ulcer without osteomyelitis. 2. There is an additional 1.5 x 3.3 cm fluid collection in the subcutaneous tissue over the right upper gluteal region, nonspecific but may represent sequela of prior hemorrhage or infection. 3. Multiple other nonacute observations, as described above. Electronically Signed   By: Ree Molt M.D.   On: 04/24/2023 14:22   CT Head Wo Contrast Result Date: 04/24/2023 CLINICAL DATA:  Mental status change, unknown cause. EXAM: CT HEAD WITHOUT CONTRAST TECHNIQUE: Contiguous axial images were obtained from the base of the skull through the vertex without intravenous contrast. RADIATION DOSE REDUCTION: This exam was performed according to the departmental dose-optimization program which includes  automated exposure control, adjustment of the mA and/or kV according to patient size and/or use of iterative reconstruction technique. COMPARISON:  CT scan head from 01/17/2023. FINDINGS: Brain: No evidence of acute infarction, hemorrhage, hydrocephalus, extra-axial collection or mass lesion/mass effect. There is bilateral periventricular hypodensity, which is non-specific but most likely seen in the settings of microvascular ischemic changes. Moderate in extent. Otherwise normal appearance of brain parenchyma. Ventricles are normal. Cerebral volume is age appropriate. Vascular: No hyperdense vessel or unexpected calcification. Intracranial arteriosclerosis. Skull: Normal. Negative for fracture or focal lesion. Sinuses/Orbits: No acute finding. Other: Visualized mastoid air cells are unremarkable. No mastoid effusion. There are soft tissue attenuation areas completely occluding the left external auditory canal and  partially obstructing the right external auditory canal. Correlate with physical examination. IMPRESSION: *No acute intracranial abnormality. *Other observations, as described above. Electronically Signed   By: Ree Molt M.D.   On: 04/24/2023 14:08   DG Chest Portable 1 View Result Date: 04/24/2023 CLINICAL DATA:  Possible sepsis. EXAM: PORTABLE CHEST 1 VIEW COMPARISON:  Chest radiograph dated 01/17/2023. FINDINGS: Emphysema. No focal consolidation, pleural effusion or pneumothorax. The cardiac silhouette is within normal limits. Atherosclerotic calcification of the aorta. No acute osseous pathology. Osteopenia. IMPRESSION: No active disease. Electronically Signed   By: Vanetta Chou M.D.   On: 04/24/2023 12:19    EKG: Independently reviewed.  Sinus rhythm, chronic ST-T changes on chest leads  Assessment/Plan Principal Problem:   Sepsis (HCC) Active Problems:   Protein-calorie malnutrition, severe   Stage IV pressure ulcer of sacral region [facility])  (please populate well all problems here  in Problem List. (For example, if patient is on BP meds at home and you resume or decide to hold them, it is a problem that needs to be her. Same for CAD, COPD, HLD and so on)  Severe sepsis -With signs of endorgan damage of acute encephalopathy on top of baseline dementia, sepsis evidenced by leukocytosis, hypotension, source of infection is large sacral wound infection and stage IV pressure ulcer -Wound care nurse unable to provide details of recent wound culture result.  Send a formal request to [facility] nursing home for recent wound culture result during debridement 4 days ago.  Escalate antibiotic coverage to vancomycin  and Zosyn  -Consult wound care team -Continue maintenance IV fluid -Short-term and long-term prognosis poor given the significant worsening of baseline function as well as nutrition status p.o. intake.  Nursing home staff reported that patient's catheter is informed over the weekend and plan to treat conservatively if no significant improvement, likely will need to consider initiation of goal of care talk.  Consult palliative care.  Acute metabolic encephalopathy -Secondary sepsis and hypernatremia, management as above -CT head negative for acute findings -N.p.o. but only sips of fluid and medications for tonight, speech evaluation in AM. -Aspiration precaution  Hypernatremia -Secondary to severe poor intake and dehydration.  Calculated free water  deficit 2.8 L.  Plan to correct in 1-2 days.  Start patient on 0.45% fluid  Stage IV sacral pressure ulcer, POA -Management as above  PE, Hx of -Was diagnosed in November 2024, was considered to be provoked secondary to her hip surgery in the same month.  Plan to continue Eliquis   Severe protein calorie malnutrition -BMI= 17 -Consult dietitian for calorie count -Continue Ensure     VT prophylaxis: Eliquis  Code Status: Full code Family Communication: None at bedside Disposition Plan: Patient is sick with severe sepsis  requiring IV antibiotics and abnormal electrolytes requiring IV fluid, will need close monitoring for mentation changes as well, expect more than 2 midnight hospital stay Consults called: None Admission status: PCU admit   Cort ONEIDA Mana MD [facility] Pager [phone]  04/24/2023, 3:35 PM

## 2023-04-24 NOTE — Sepsis Progress Note (Signed)
 Elink monitoring for the code sepsis protocol.

## 2023-04-24 NOTE — ED Triage Notes (Signed)
Pt here from [facility] who presents with hypotension and sacral wound, stage 4 per EMS per facility. Pt has HX of dementia.   EMS states 100.8 axillary.   [id number]

## 2023-04-24 NOTE — Sepsis Progress Note (Signed)
 Notified bedside nurse of need to draw repeat lactic acid.

## 2023-04-24 NOTE — ED Notes (Signed)
CCMD called and verified that patient is on cardiac tele

## 2023-04-24 NOTE — Progress Notes (Signed)
HRRA complete.

## 2023-04-24 NOTE — Sepsis Progress Note (Signed)
 Notified provider and bedside nurse of need to order and draw repeat lactic acid #3.

## 2023-04-24 NOTE — Sepsis Progress Note (Signed)
Notified bedside nurse of need to draw repeat lactic acid (#3). 

## 2023-04-25 DIAGNOSIS — E43 Unspecified severe protein-calorie malnutrition: Secondary | ICD-10-CM

## 2023-04-25 DIAGNOSIS — G9341 Metabolic encephalopathy: Secondary | ICD-10-CM | POA: Diagnosis not present

## 2023-04-25 DIAGNOSIS — L89150 Pressure ulcer of sacral region, unstageable: Secondary | ICD-10-CM | POA: Diagnosis not present

## 2023-04-25 DIAGNOSIS — Z515 Encounter for palliative care: Secondary | ICD-10-CM

## 2023-04-25 DIAGNOSIS — A419 Sepsis, unspecified organism: Secondary | ICD-10-CM | POA: Diagnosis not present

## 2023-04-25 DIAGNOSIS — E870 Hyperosmolality and hypernatremia: Secondary | ICD-10-CM

## 2023-04-25 DIAGNOSIS — L89154 Pressure ulcer of sacral region, stage 4: Secondary | ICD-10-CM

## 2023-04-25 DIAGNOSIS — N179 Acute kidney failure, unspecified: Secondary | ICD-10-CM | POA: Diagnosis not present

## 2023-04-25 DIAGNOSIS — E876 Hypokalemia: Secondary | ICD-10-CM

## 2023-04-25 LAB — COMPREHENSIVE METABOLIC PANEL
ALT: 11 U/L (ref 0–44)
AST: 25 U/L (ref 15–41)
Albumin: 2.1 g/dL — ABNORMAL LOW (ref 3.5–5.0)
Alkaline Phosphatase: 42 U/L (ref 38–126)
Anion gap: 10 (ref 5–15)
BUN: 41 mg/dL — ABNORMAL HIGH (ref 8–23)
CO2: 21 mmol/L — ABNORMAL LOW (ref 22–32)
Calcium: 7.6 mg/dL — ABNORMAL LOW (ref 8.9–10.3)
Chloride: 120 mmol/L — ABNORMAL HIGH (ref 98–111)
Creatinine, Ser: 0.83 mg/dL (ref 0.44–1.00)
GFR, Estimated: >60 mL/min (ref >60)
Glucose, Bld: 76 mg/dL (ref 70–99)
Potassium: 3 mmol/L — ABNORMAL LOW (ref 3.5–5.1)
Sodium: UNDETERMINED mmol/L (ref 135–145)
Sodium: 151 mmol/L — ABNORMAL HIGH (ref 135–145)
Total Bilirubin: 0.2 mg/dL (ref 0.0–1.2)
Total Protein: 5.4 g/dL — ABNORMAL LOW (ref 6.5–8.1)

## 2023-04-25 LAB — CBC
HCT: 34.7 % — ABNORMAL LOW (ref 36.0–46.0)
Hemoglobin: 10.4 g/dL — ABNORMAL LOW (ref 12.0–15.0)
MCH: 29.4 pg (ref 26.0–34.0)
MCHC: 30 g/dL (ref 30.0–36.0)
MCV: 98 fL (ref 80.0–100.0)
Platelets: 212 10*3/uL (ref 150–400)
RBC: 3.54 MIL/uL — ABNORMAL LOW (ref 3.87–5.11)
RDW: 13.7 % (ref 11.5–15.5)
WBC: UNDETERMINED 10*3/uL (ref 4.0–10.5)
WBC: 10.3 10*3/uL (ref 4.0–10.5)
nRBC: 0 % (ref 0.0–0.2)

## 2023-04-25 LAB — VANCOMYCIN, RANDOM: Vancomycin Rm: 5 ug/mL

## 2023-04-25 MED ORDER — POTASSIUM CHLORIDE 10 MEQ/100ML IV SOLN
10.0000 meq | INTRAVENOUS | Status: AC
  Administered 2023-04-25 (×4): 10 meq via INTRAVENOUS
  Filled 2023-04-25 (×4): qty 100

## 2023-04-25 MED ORDER — COLLAGENASE 250 UNIT/GM EX OINT
TOPICAL_OINTMENT | Freq: Every day | CUTANEOUS | Status: DC
  Filled 2023-04-25 (×2): qty 30

## 2023-04-25 MED ORDER — VANCOMYCIN HCL 750 MG/150ML IV SOLN
750.0000 mg | INTRAVENOUS | Status: DC
Start: 2023-04-25 — End: 2023-04-26
  Administered 2023-04-25: 750 mg via INTRAVENOUS
  Filled 2023-04-25 (×2): qty 150

## 2023-04-25 MED ORDER — SODIUM CHLORIDE 0.9 % IV BOLUS
500.0000 mL | Freq: Once | INTRAVENOUS | Status: AC
  Administered 2023-04-25: 500 mL via INTRAVENOUS

## 2023-04-25 MED ORDER — DAKINS (1/4 STRENGTH) 0.125 % EX SOLN
Freq: Every day | CUTANEOUS | Status: AC
  Filled 2023-04-25: qty 1

## 2023-04-25 NOTE — Progress Notes (Signed)
 Progress Note   [name] DOB: 02-13-1946 DOA: 04/24/2023     1 DOS: the patient was seen and examined on 04/25/2023   Brief hospital course: Kaylee Robinson is a 78 y.o. female with medical history significant of advanced dementia, HTN, anxiety/depression, chronic sacral wounds, PE on Eliquis , sent from nursing home for evaluation of hypotension and worsening of sacral wound.   Patient is admitted for further management evaluation of severe sepsis secondary to sacral wound infection.  Assessment and Plan: Severe sepsis Septic shock She has leukocytosis, hypotension, low-grade fever, elevated white count, lactic acid acute encephalopathy with source of infection sacral wound. Patient will be continued on broad-spectrum IV antibiotics including vancomycin , Zosyn . Continue gentle IV fluids, IV fluid boluses for low blood pressures. Discussed with PCCM who advised to continue current care, no need for pressors as her MAP is > 65.  Overall prognosis poor discussed with legal guardian who is DSS.  Guardian working on making her DNR DNI advised to get more information regarding her clinical condition.  Palliative care involved.  Acute metabolic encephalopathy- In the setting of severe sepsis. Continue to monitor neurochecks. N.p.o. as she is more lethargic than sleeping. Delirium precautions. Fall, aspiration precautions. Swallow evaluation once she is more alert.  Hypernatremia: Due to dehydration. Continue half-normal saline at 125 mL/h. Trend sodium.  Hypokalemia: IV potassium supplements ordered. Continue to monitor daily electrolytes.  Stage IV sacral decubitus ulcer present on admission. Continue wound care. Surgery team called for possible debridement. Eliquis  on hold for possible surgical intervention.  History of PE on Eliquis  which is held for possible surgical intervention of sacral ulcer.  Severe protein calorie malnutrition BMI 17. Her  overall prognosis is poor. Dietitian on board. Restart diet and supplements when she is more alert awake.  Advanced dementia: Contributes to her poor prognosis. Supportive care. Continue to discuss with legal guardian regarding clinical condition and management.  Active Pressure Injury/Wound(s)     Pressure Ulcer  Duration          Pressure Injury 04/24/23 Sacrum Unstageable - Full thickness tissue loss in which the base of the injury is covered by slough (yellow, tan, gray, green or brown) and/or eschar (tan, brown or black) in the wound bed. 10 cm x 8 cm unable to determine true  1 day   Pressure Injury 04/25/23 Heel Right Deep Tissue Pressure Injury - Purple or maroon localized area of discolored intact skin or blood-filled blister due to damage of underlying soft tissue from pressure and/or shear. 4 cm x 3 cm 100% purple maroon discolo <1 day          Nursing supportive care. Fall, aspiration precautions. DVT prophylaxis   Code Status: Full Code  Subjective: Patient is seen and examined today morning.  She is more lethargic, sleepy.  She did moan upon touching.  Unable to provide history.  BP lower side, IV fluid bolus ordered.  Physical Exam: Vitals:   04/25/23 1400 04/25/23 1415 04/25/23 1430 04/25/23 1445  BP: (!) 105/55 (!) 107/57 (!) 107/58 92/61  Pulse: (!) 52 (!) 54 (!) 55 (!) 53  Resp: (!) 9 12 15 16   Temp:      TempSrc:      SpO2: 100% 98% 94% 97%  Weight:      Height:        General - Elderly ill Caucasian cachectic female, sleepy and lethargic. HEENT - PERRLA, EOMI, atraumatic head, non tender sinuses. Lung - Clear, basal rales, rhonchi,  no wheezes. Heart - S1, S2 heard, no murmurs, rubs, trace pedal edema. Abdomen - Soft, non tender, bowel sounds good Neuro -sleepy, lethargic, does not follow commands, unable to do full neuro exam. Skin - Warm and dry.  Data Reviewed:      Latest Ref Rng & Units 04/25/2023    6:36 AM 04/25/2023    5:00 AM 04/24/2023    11:54 AM  CBC  WBC 4.0 - 10.5 K/uL 10.3  SPECIMEN CONTAMINATED, UNABLE TO PERFORM TEST(S).  13.0   Hemoglobin 12.0 - 15.0 g/dL 89.5   86.5   Hematocrit 36.0 - 46.0 % 34.7   45.5   Platelets 150 - 400 K/uL 212   317       Latest Ref Rng & Units 04/25/2023    6:36 AM 04/25/2023    5:00 AM 04/24/2023   11:54 AM  BMP  Glucose 70 - 99 mg/dL 76   898   BUN 8 - 23 mg/dL 41   56   Creatinine 9.55 - 1.00 mg/dL 9.16   8.65   Sodium 864 - 145 mmol/L 151  SPECIMEN CONTAMINATED, UNABLE TO PERFORM TEST(S).  158   Potassium 3.5 - 5.1 mmol/L 3.0   3.8   Chloride 98 - 111 mmol/L 120   120   CO2 22 - 32 mmol/L 21   20   Calcium 8.9 - 10.3 mg/dL 7.6   8.6    CT ABDOMEN PELVIS W CONTRAST Result Date: 04/24/2023 CLINICAL DATA:  Sepsis.  Deep sacral wound. EXAM: CT ABDOMEN AND PELVIS WITH CONTRAST TECHNIQUE: Multidetector CT imaging of the abdomen and pelvis was performed using the standard protocol following bolus administration of intravenous contrast. RADIATION DOSE REDUCTION: This exam was performed according to the departmental dose-optimization program which includes automated exposure control, adjustment of the mA and/or kV according to patient size and/or use of iterative reconstruction technique. CONTRAST:  OMNIPAQUE  IOHEXOL  300 MG/ML  SOLN COMPARISON:  CT scan abdomen and pelvis from 10/19/2009. FINDINGS: Lower chest: The lung bases are clear. No pleural effusion. The heart is normal in size. No pericardial effusion. Hepatobiliary: The liver is normal in size. Non-cirrhotic configuration. No suspicious mass. These is mild diffuse hepatic steatosis. No intrahepatic or extrahepatic bile duct dilation. No calcified gallstones. Normal gallbladder wall thickness. No pericholecystic inflammatory changes. Pancreas: Unremarkable. No pancreatic ductal dilatation or surrounding inflammatory changes. Spleen: Within normal limits. No focal lesion. Adrenals/Urinary Tract: Adrenal glands are unremarkable. No suspicious  renal mass. There is a 8 x 11 mm simple cyst in the left kidney upper pole. No hydronephrosis. No renal or ureteric calculi. PLEASE NOTE THAT THE EVALUATION OF PELVIC VISCERA IS MILDLY LIMITED DUE TO STREAK ARTIFACTS FROM RIGHT HIP PROSTHESIS AND LEFT PROXIMAL FEMUR HARDWARE. Grossly unremarkable urinary bladder. Stomach/Bowel: No disproportionate dilation of the small or large bowel loops. No evidence of abnormal bowel wall thickening or inflammatory changes. The appendix was not visualized; however there is no acute inflammatory process in the right lower quadrant. There are multiple diverticula throughout the colon, without imaging signs of diverticulitis. Vascular/Lymphatic: No ascites or pneumoperitoneum. No abdominal or pelvic lymphadenopathy, by size criteria. No aneurysmal dilation of the major abdominal arteries. There are moderate peripheral atherosclerotic vascular calcifications of the aorta and its major branches. Reproductive: Evaluation of reproductive organs is markedly limited due to extensive streak artifacts. There is lobulated area in the pelvis which corresponds to patient's known pre-existing fundal leiomyoma. No large adnexal mass seen. Other: There is an approximately  1.5 x 3.3 cm fluid collection in the subcutaneous tissue over the right upper gluteal region, laterally, nonspecific but may represent sequela of prior hemorrhage or infection. There is focal soft tissue defect in the right paramedian gluteal region at the level of mid sacrum. There is associated nonspecific fluid collection and small amount of air in the subcutaneous fat. However, no discrete underlying sacral bone erosions noted. Musculoskeletal: No suspicious osseous lesions. There are moderate multilevel degenerative changes in the visualized spine. There is subtle superior endplate compression deformity of L3 vertebral body without significant retropulsion or spinal canal compromise. No associated surrounding fat stranding.  This is of indeterminate age but likely chronic. IMPRESSION: 1. There is focal soft tissue defect in the right paramedian gluteal region at the level of mid sacrum without underlying bone erosions. Findings favor sacral decubitus ulcer without osteomyelitis. 2. There is an additional 1.5 x 3.3 cm fluid collection in the subcutaneous tissue over the right upper gluteal region, nonspecific but may represent sequela of prior hemorrhage or infection. 3. Multiple other nonacute observations, as described above. Electronically Signed   By: Ree Molt M.D.   On: 04/24/2023 14:22   CT Head Wo Contrast Result Date: 04/24/2023 CLINICAL DATA:  Mental status change, unknown cause. EXAM: CT HEAD WITHOUT CONTRAST TECHNIQUE: Contiguous axial images were obtained from the base of the skull through the vertex without intravenous contrast. RADIATION DOSE REDUCTION: This exam was performed according to the departmental dose-optimization program which includes automated exposure control, adjustment of the mA and/or kV according to patient size and/or use of iterative reconstruction technique. COMPARISON:  CT scan head from 01/17/2023. FINDINGS: Brain: No evidence of acute infarction, hemorrhage, hydrocephalus, extra-axial collection or mass lesion/mass effect. There is bilateral periventricular hypodensity, which is non-specific but most likely seen in the settings of microvascular ischemic changes. Moderate in extent. Otherwise normal appearance of brain parenchyma. Ventricles are normal. Cerebral volume is age appropriate. Vascular: No hyperdense vessel or unexpected calcification. Intracranial arteriosclerosis. Skull: Normal. Negative for fracture or focal lesion. Sinuses/Orbits: No acute finding. Other: Visualized mastoid air cells are unremarkable. No mastoid effusion. There are soft tissue attenuation areas completely occluding the left external auditory canal and partially obstructing the right external auditory canal.  Correlate with physical examination. IMPRESSION: *No acute intracranial abnormality. *Other observations, as described above. Electronically Signed   By: Ree Molt M.D.   On: 04/24/2023 14:08   DG Chest Portable 1 View Result Date: 04/24/2023 CLINICAL DATA:  Possible sepsis. EXAM: PORTABLE CHEST 1 VIEW COMPARISON:  Chest radiograph dated 01/17/2023. FINDINGS: Emphysema. No focal consolidation, pleural effusion or pneumothorax. The cardiac silhouette is within normal limits. Atherosclerotic calcification of the aorta. No acute osseous pathology. Osteopenia. IMPRESSION: No active disease. Electronically Signed   By: Vanetta Chou M.D.   On: 04/24/2023 12:19   Family Communication: Discussed with legal guardian over phone, she  understand and agree. All questions answered.  Disposition: Status is: Inpatient Remains inpatient appropriate because: septic shock due to sacral wound infection.  Planned Discharge Destination: Skilled nursing facility     MDM level 3-patient is sick with severe sepsis, worsening sacral wound.  Mentation poor, BP lower side requiring IV fluid boluses.  Electrolytes abnormal, lactic acid elevated.  Patient will need close hemodynamic, neurologic, telemetry monitoring.  She is at high risk for sudden clinical deterioration.  Author: Concepcion Riser, MD 04/25/2023 3:03 PM Secure chat 7am to 7pm For on call review [email or web address].

## 2023-04-25 NOTE — ED Notes (Signed)
 Pt Oxygen probe switched nares, and attempted to use earlobe with no read. Pt has oximeter on her Left nare at this time

## 2023-04-25 NOTE — Consult Note (Signed)
 Pharmacy Antibiotic Note  ASSESSMENT: 78 y.o. female with PMH including dementia, HTN, PE (on Eliquis ) is presenting with  sepsis secondary to sacral wound . She is a resident at [facility], and reportedly her sacral wound is stage 4. Pt is febrile and hypotensive; also with leukocytosis. She is in AKI. Pharmacy has been consulted to manage Zosyn  and vancomycin  dosing.  On 2/5, vanc random came back subtherapeutic. Renal function has quickly recovered from admission and patient now back at baseline. Plan to initiate scheduled vancomycin  now.  Patient measurements: Height: 5' 6 (167.6 cm) Weight: 48 kg (105 lb 13.1 oz) IBW/kg (Calculated) : 59.3  Vital signs: BP: 124/49 (02/05 1700) Pulse Rate: 52 (02/05 1700) Recent Labs  Lab 04/24/23 1154 04/25/23 0500 04/25/23 0636  WBC 13.0* SPECIMEN CONTAMINATED, UNABLE TO PERFORM TEST(S). 10.3  CREATININE 1.34*  --  0.83   Estimated Creatinine Clearance: 43 mL/min (by C-G formula based on SCr of 0.83 mg/dL).  Allergies: Allergies  Allergen Reactions   Enalapril Other (See Comments)   Naproxen Swelling   Amoxicillin  Rash   Sulfa Antibiotics Rash    Antimicrobials this admission: Cefepime  2/4 x 1 Flagyl  2/4 x 1 Vancomycin  2/4 >> Zosyn  2/4 >>  Dose adjustments this admission: N/A  Microbiology results: 2/4 BCx: NGTD 2/4 Respiratory panel PCR: negative  PLAN: Continue Zosyn  3.375 g IV q8H Initiate vancomycin  750 mg IV q24H eAUC 541, Cmax 35, Cmin 14 Scr 0.83, TBW, Vd 0.72 L/kg Follow up culture results to assess for antibiotic optimization. Monitor renal function to assess for any necessary antibiotic dosing changes.   Thank you for allowing pharmacy to be a part of this patient's care.  Will M. Lenon, PharmD Clinical Pharmacist 04/25/2023 5:55 PM

## 2023-04-25 NOTE — Consult Note (Addendum)
 Consultation Note Date: 04/25/2023 at 1000  Patient Name: Kaylee Robinson  DOB: 1945-10-06  MRN: [id number]  Age / Sex: 78 y.o., female  PCP: [facility] Referring Physician: Darci Pore, MD  HPI/Patient Profile: 78 y.o. 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   Of note, patient is familiar to our service as I saw her during her 01/17/2019 for admission for AMS and poor p.o. intake.  Patient is being treated for severe sepsis with signs of endorgan damage and acute encephalopathy on top of baseline dementia.  PMT was consulted for continue to lose weight, eating only 10-20% every meal for at least 2 weeks, worsening of sacral wounds.  Clinical Assessment and Goals of Care: Extensive chart review completed prior to meeting patient including labs, vital signs, imaging, progress notes, orders, and available advanced directive documents from current and previous encounters. I then met with patient at bedside in ED to discuss diagnosis prognosis, GOC, EOL wishes, disposition and options.  Patient is alert to self.  She makes constant conversation but her sentences are nonlinear.  She is unable to participate in goals of care discussion or medical decision making independently at this time.  I counseled with ED RN.  I requested that she notify me should any family/friends come to bedside.  Also discussed patient symptom management.  Patient is appropriately managed with current regimen.  No adjustment to [facility] needed at this time.  After assessing the patient, I attempted to speak with her legal guardian over the phone.  I spoke with Sentrell - person listed as  legal guardian in chart and with whom I spoke about patient's care during previous hospitalization. Sentrell shares she is patient's former legal guardian and that Sari is now patient's new legal guardian.  Sentrell is aware of Ms. Wagner's condition.  She requested a medical update.  We discussed patient's severe advanced dementia and now unstageable sacral wound.  I did counseled with her in regards to wound nurses recommendations.  She shares that patient has gone downhill ever since discharge in October.  She shares that the skilled nursing facility has done their best but that her wound has just never shown improvement.  Discussed that patient's poor mobility and nutrition has significantly contributed to her inability to heal from a sacral wound.    Sentrell also informed me that she had already submitted DNR/DNI paperwork to her supervisor on Ms. Wagner's behalf.  She has not heard a response and advised that I speak with Sari in regards to whether or not this paperwork has been approved by DSS supervisor.  Attempted to speak with Sari [phone]) over the phone.  No answer.  HIPAA compliant voicemail left.  No adjustment to plan of care or MAR at this time.  Awaiting return call from patient's legal guardian.  Primary Decision Maker LEGAL GUARDIAN  Physical Exam Vitals reviewed.  Constitutional:      Comments: Thin, frail  HENT:     Nose: Nose normal.     Mouth/Throat:     Mouth: Mucous membranes are moist.  Eyes:     Pupils: Pupils are equal, round, and reactive to light.  Pulmonary:     Effort: Pulmonary effort is normal.  Abdominal:     Palpations: Abdomen is soft.  Musculoskeletal:     Comments: Generalized weakness  Skin:    General: Skin is warm and dry.     Comments: UTA sacral wound  Neurological:     Mental Status: She is alert.     Comments: Oriented to self  Psychiatric:        Behavior: Behavior normal.     Palliative Assessment/Data:  20%     Thank you for this consult. Palliative medicine will continue to follow and assist holistically.   Time Total: 75 minutes  Time spent includes: Detailed review of medical records (labs, imaging, vital signs), medically appropriate exam (mental status, respiratory, cardiac, skin), discussed with treatment team, counseling and educating patient, family and staff, documenting clinical information, medication management and coordination of care.  Signed by: Lamarr Gunner, DNP, FNP-BC Palliative Medicine   Please contact Palliative Medicine Team providers via [facility] for questions and concerns.

## 2023-04-25 NOTE — ED Notes (Signed)
 This RN attempted to feed pt dinner, pt refused eating at this time.

## 2023-04-25 NOTE — Consult Note (Signed)
 SURGICAL CONSULTATION NOTE   HISTORY OF PRESENT ILLNESS (HPI):  78 y.o. female presented to [facility] ED for evaluation of altered mental status and worsening sacral ulcer.  Patient unable to give any history due to her acute over chronic altered mental status.  Patient with baseline dementia but has been not responding to question and sleepy.  As per chart review and discussion patient has a history of hip bipolar arthroplasty on December 07, 2022.  Since then patient has been clinically deteriorating.  Patient from [facility] nursing facility.  She was brought to the ED due to worsening altered mental status and worsening sacral ulcer.  Initial labs in the ED shows white blood cell count of 13,000, hyponatremia of 158 (today decreased to 151).  She had a CT of the abdomen and pelvis for evaluation of the sacral ulcer.  There was focal soft tissue defect with underlying bone erosion.  No other sign of osteomyelitis.  3 cm fluid collection on the right gluteal area.  I personally evaluated the images.   PAST MEDICAL HISTORY (PMH):  Past Medical History:  Diagnosis Date   Alzheimer disease (HCC)    Arthritis    bilateral knees   Hypertension    Vascular dementia (HCC)      PAST SURGICAL HISTORY (PSH):  Past Surgical History:  Procedure Laterality Date   HIP ARTHROPLASTY Right 12/07/2022   Procedure: ARTHROPLASTY BIPOLAR HIP (HEMIARTHROPLASTY);  Surgeon: Edie Norleen PARAS, MD;  Location: [facility] ORS;  Service: Orthopedics;  Laterality: Right;   HIP SURGERY     INTRAMEDULLARY (IM) NAIL INTERTROCHANTERIC Left 09/20/2018   Procedure: INTRAMEDULLARY (IM) NAIL INTERTROCHANTRIC;  Surgeon: Marchia Drivers, MD;  Location: [facility] ORS;  Service: Orthopedics;  Laterality: Left;   TONSILLECTOMY     adnoidectomy     MEDICATIONS:  Prior to Admission medications   Medication Sig Start Date End Date Taking? Authorizing Provider  acetaminophen  (TYLENOL ) 325 MG tablet Take 650 mg by mouth 3 (three) times  daily as needed for mild pain (pain score 1-3). 11/21/22  Yes [provider]  alum & mag hydroxide-simeth (MAALOX/MYLANTA) 200-200-20 MG/5ML suspension Take 30 mLs by mouth every 4 (four) hours as needed for indigestion. 09/23/18  Yes Kathrene Almarie Bake, NP  apixaban  (ELIQUIS ) 5 MG TABS tablet Take 1 tablet (5 mg total) by mouth 2 (two) times daily. 01/23/23  Yes Darci Pore, MD  ciprofloxacin (CIPRO) 500 MG tablet Take 500 mg by mouth 2 (two) times daily. 04/23/23  Yes [provider]  divalproex (DEPAKOTE SPRINKLE) 125 MG capsule Take 250 mg by mouth every 8 (eight) hours. 10/23/22  Yes [provider]  docusate sodium  (COLACE) 100 MG capsule Take 1 capsule (100 mg total) by mouth 2 (two) times daily. Patient taking differently: Take 100 mg by mouth 2 (two) times daily as needed for mild constipation. 09/23/18  Yes Kathrene Almarie Bake, NP  dronabinol (MARINOL) 5 MG capsule Take 5 mg by mouth 2 (two) times daily before a meal.   Yes [provider]  polyethylene glycol (MIRALAX  / GLYCOLAX ) 17 g packet Take 17 g by mouth daily as needed for mild constipation. 09/23/18  Yes Kathrene Almarie Bake, NP  sertraline  (ZOLOFT ) 100 MG tablet Take 100 mg by mouth daily. 04/19/23  Yes [provider]  VITAMIN D-1000 MAX ST 25 MCG (1000 UT) tablet Take 1,000 Units by mouth daily. 09/28/22  Yes [provider]  feeding supplement (ENSURE ENLIVE / ENSURE PLUS) LIQD Take 237 mLs by  mouth 3 (three) times daily between meals. 12/13/22   Awanda City, MD  LORazepam  (ATIVAN ) 0.5 MG tablet Take 0.5 mg by mouth every 8 (eight) hours as needed for anxiety. Patient not taking: Reported on 04/24/2023 01/11/23   [provider]  memantine  (NAMENDA ) 10 MG tablet Take 10 mg by mouth 2 (two) times daily. Patient not taking: Reported on 04/24/2023 10/23/22   [provider]  Multiple Vitamin (MULTIVITAMIN WITH MINERALS) TABS tablet Take 1 tablet by mouth  daily. Patient not taking: Reported on 04/24/2023 12/14/22   Awanda City, MD  traZODone  (DESYREL ) 50 MG tablet Take 50 mg by mouth at bedtime. Patient not taking: Reported on 04/24/2023 10/23/22   [provider]     ALLERGIES:  Allergies  Allergen Reactions   Enalapril Other (See Comments)   Naproxen Swelling   Amoxicillin  Rash   Sulfa Antibiotics Rash     SOCIAL HISTORY:  Social History   Socioeconomic History   Marital status: Married    Spouse name: Not on file   Number of children: Not on file   Years of education: Not on file   Highest education level: Not on file  Occupational History   Not on file  Tobacco Use   Smoking status: Some Days    Current packs/day: 1.00    Types: Cigarettes   Smokeless tobacco: Never  Substance and Sexual Activity   Alcohol use: No   Drug use: Never   Sexual activity: Not Currently  Other Topics Concern   Not on file  Social History Narrative   Not on file   Social Drivers of Health   Financial Resource Strain: Low Risk  (06/12/2022)   Received from [facility], [facility]   Overall Financial Resource Strain (CARDIA)    Difficulty of Paying Living Expenses: Not very hard  Food Insecurity: Patient Unable To Answer (01/19/2023)   Hunger Vital Sign    Worried About Running Out of Food in the Last Year: Patient unable to answer    Ran Out of Food in the Last Year: Patient unable to answer  Transportation Needs: Patient Unable To Answer (01/19/2023)   PRAPARE - Transportation    Lack of Transportation (Medical): Patient unable to answer    Lack of Transportation (Non-Medical): Patient unable to answer  Physical Activity: Insufficiently Active (06/12/2022)   Received from [facility], [facility]   Exercise Vital Sign    Days of Exercise per Week: 5 days    Minutes of Exercise per Session: 10 min  Stress: Patient Unable To Answer (06/12/2022)   Received from [facility], [facility] of Occupational  Health - Occupational Stress Questionnaire    Feeling of Stress : Patient unable to answer  Social Connections: Moderately Integrated (06/12/2022)   Received from [facility], [facility]   Social Network    How would you rate your social network (family, work, friends)?: Adequate participation with social networks  Intimate Partner Violence: Patient Unable To Answer (01/19/2023)   Humiliation, Afraid, Rape, and Kick questionnaire    Fear of Current or Ex-Partner: Patient unable to answer    Emotionally Abused: Patient unable to answer    Physically Abused: Patient unable to answer    Sexually Abused: Patient unable to answer      FAMILY HISTORY:  Family History  Problem Relation Age of Onset   Breast cancer Neg Hx      REVIEW OF SYSTEMS:   Unable to do complete  review of system due to patient altered mental status  VITAL SIGNS:  Temp:  [97.3 F (36.3 C)-97.5 F (36.4 C)] 97.4 F (36.3 C) (02/05 0535) Pulse Rate:  [25-140] 53 (02/05 1445) Resp:  [9-25] 16 (02/05 1445) BP: (73-121)/(50-80) 92/61 (02/05 1445) SpO2:  [78 %-100 %] 97 % (02/05 1445)     Height: 5' 6 (167.6 cm) Weight: 48 kg BMI (Calculated): 17.09   INTAKE/OUTPUT:  This shift: Total I/O In: 1850 [I.V.:900; IV Piggyback:950] Out: -   Last 2 shifts: @IOLAST2SHIFTS @   PHYSICAL EXAM:  Constitutional:  -- Acute over chronically ill -- Cachectic Eyes:  -- Pupils equally round and reactive to light  -- No scleral icterus  Ear, nose, and throat:  -- No jugular venous distension  Pulmonary:  -- No crackles  -- Equal breath sounds bilaterally -- Breathing non-labored at rest Cardiovascular:  -- S1, S2 present  -- No pericardial rubs Gastrointestinal:  -- Abdomen soft, nontender, non-distended, no guarding or rebound tenderness -- No abdominal masses appreciated, pulsatile or otherwise  Musculoskeletal and Integumentary:  -- Wounds: Large sacral ulcer, stage IV with necrotic tissue there is deep  fluctuance to the right side of the ulcer. -- Extremities: Multiple ischemic lesions in the lower extremities Neurologic:  -- Altered mental status   Labs:     Latest Ref Rng & Units 04/25/2023    6:36 AM 04/25/2023    5:00 AM 04/24/2023   11:54 AM  CBC  WBC 4.0 - 10.5 K/uL 10.3  SPECIMEN CONTAMINATED, UNABLE TO PERFORM TEST(S).  13.0   Hemoglobin 12.0 - 15.0 g/dL 89.5   86.5   Hematocrit 36.0 - 46.0 % 34.7   45.5   Platelets 150 - 400 K/uL 212   317       Latest Ref Rng & Units 04/25/2023    6:36 AM 04/25/2023    5:00 AM 04/24/2023   11:54 AM  CMP  Glucose 70 - 99 mg/dL 76   898   BUN 8 - 23 mg/dL 41   56   Creatinine 9.55 - 1.00 mg/dL 9.16   8.65   Sodium 864 - 145 mmol/L 151  SPECIMEN CONTAMINATED, UNABLE TO PERFORM TEST(S).  158   Potassium 3.5 - 5.1 mmol/L 3.0   3.8   Chloride 98 - 111 mmol/L 120   120   CO2 22 - 32 mmol/L 21   20   Calcium 8.9 - 10.3 mg/dL 7.6   8.6   Total Protein 6.5 - 8.1 g/dL 5.4   6.7   Total Bilirubin 0.0 - 1.2 mg/dL 0.2   0.7   Alkaline Phos 38 - 126 U/L 42   53   AST 15 - 41 U/L 25   26   ALT 0 - 44 U/L 11   12     Imaging studies:  EXAM: CT ABDOMEN AND PELVIS WITH CONTRAST   TECHNIQUE: Multidetector CT imaging of the abdomen and pelvis was performed using the standard protocol following bolus administration of intravenous contrast.   RADIATION DOSE REDUCTION: This exam was performed according to the departmental dose-optimization program which includes automated exposure control, adjustment of the mA and/or kV according to patient size and/or use of iterative reconstruction technique.   CONTRAST:  OMNIPAQUE  IOHEXOL  300 MG/ML  SOLN   COMPARISON:  CT scan abdomen and pelvis from 10/19/2009.   FINDINGS: Lower chest: The lung bases are clear. No pleural effusion. The heart is normal in size. No pericardial effusion.  Hepatobiliary: The liver is normal in size. Non-cirrhotic configuration. No suspicious mass. These is mild diffuse  hepatic steatosis. No intrahepatic or extrahepatic bile duct dilation. No calcified gallstones. Normal gallbladder wall thickness. No pericholecystic inflammatory changes.   Pancreas: Unremarkable. No pancreatic ductal dilatation or surrounding inflammatory changes.   Spleen: Within normal limits. No focal lesion.   Adrenals/Urinary Tract: Adrenal glands are unremarkable. No suspicious renal mass. There is a 8 x 11 mm simple cyst in the left kidney upper pole. No hydronephrosis. No renal or ureteric calculi.   PLEASE NOTE THAT THE EVALUATION OF PELVIC VISCERA IS MILDLY LIMITED DUE TO STREAK ARTIFACTS FROM RIGHT HIP PROSTHESIS AND LEFT PROXIMAL FEMUR HARDWARE.   Grossly unremarkable urinary bladder.   Stomach/Bowel: No disproportionate dilation of the small or large bowel loops. No evidence of abnormal bowel wall thickening or inflammatory changes. The appendix was not visualized; however there is no acute inflammatory process in the right lower quadrant. There are multiple diverticula throughout the colon, without imaging signs of diverticulitis.   Vascular/Lymphatic: No ascites or pneumoperitoneum. No abdominal or pelvic lymphadenopathy, by size criteria. No aneurysmal dilation of the major abdominal arteries. There are moderate peripheral atherosclerotic vascular calcifications of the aorta and its major branches.   Reproductive: Evaluation of reproductive organs is markedly limited due to extensive streak artifacts. There is lobulated area in the pelvis which corresponds to patient's known pre-existing fundal leiomyoma. No large adnexal mass seen.   Other: There is an approximately 1.5 x 3.3 cm fluid collection in the subcutaneous tissue over the right upper gluteal region, laterally, nonspecific but may represent sequela of prior hemorrhage or infection. There is focal soft tissue defect in the right paramedian gluteal region at the level of mid sacrum. There  is associated nonspecific fluid collection and small amount of air in the subcutaneous fat. However, no discrete underlying sacral bone erosions noted.   Musculoskeletal: No suspicious osseous lesions. There are moderate multilevel degenerative changes in the visualized spine. There is subtle superior endplate compression deformity of L3 vertebral body without significant retropulsion or spinal canal compromise. No associated surrounding fat stranding. This is of indeterminate age but likely chronic.   IMPRESSION: 1. There is focal soft tissue defect in the right paramedian gluteal region at the level of mid sacrum without underlying bone erosions. Findings favor sacral decubitus ulcer without osteomyelitis. 2. There is an additional 1.5 x 3.3 cm fluid collection in the subcutaneous tissue over the right upper gluteal region, nonspecific but may represent sequela of prior hemorrhage or infection. 3. Multiple other nonacute observations, as described above.     Electronically Signed   By: Ree Molt M.D.   On: 04/24/2023 14:22  Assessment/Plan:  78 y.o. female with stage IV infected sacral ulcer, complicated by pertinent comorbidities including sepsis, pulmonary embolism on Eliquis , Ativan  dementia, hypertension.  Patient with sepsis due to stage IV infected sacral ulcer.  I had a long discussion with Sari (legal guardian in charge of her care).  Discussed recurrent situation of the sacral ulcer.  Discussed alternative of debridement.  Discussed the risk of debridement including creating a bigger wound in need of aggressive local care and mobilization of patient to avoid progression and recurrent infections.  I also discussed that this ulcer will not heal due to her current nutritional status and mobilization status.  I was very honest that this might aggravate her quality of life since she is going to have a bigger ulcer that extended to heal.  All  the risk of anesthesia and  recurrent PE due to holding anticoagulation.  Also discussed other alternatives such as antibiotic therapy and local care understanding the low probability of healing.  Other alternative such as palliative care/hospice care and comfort measures were briefly discussed.  She will return to call to our palliative nurse practitioner for further discussion of this alternative as well.  Sari endorses that she will discuss this conversation with her supervisors to make the final decision regarding the goals of care and further management for this patient.  Patient will need to be off Eliquis  for at least 24 hours and n.p.o. after midnight in case they decide to proceed with debridement of sacral ulcer.  I spent 70 minutes with face-to-face and non-face-to-face time evaluating this patient including evaluation of images, discussion with consultants, conversation with legal guardian and coordinating plan of care  Lucas Petrin, MD

## 2023-04-25 NOTE — Evaluation (Signed)
 Clinical/Bedside Swallow Evaluation Patient Details  Name: Kaylee Robinson MRN: [id number] Date of Birth: 1945-10-18  Today's Date: 04/25/2023 Time: SLP Start Time (ACUTE ONLY): 1430 SLP Stop Time (ACUTE ONLY): 1525 SLP Time Calculation (min) (ACUTE ONLY): 55 min  Past Medical History:  Past Medical History:  Diagnosis Date   Alzheimer disease (HCC)    Arthritis    bilateral knees   Hypertension    Vascular dementia [facility])    Past Surgical History:  Past Surgical History:  Procedure Laterality Date   HIP ARTHROPLASTY Right 12/07/2022   Procedure: ARTHROPLASTY BIPOLAR HIP (HEMIARTHROPLASTY);  Surgeon: Edie Norleen PARAS, MD;  Location: [facility] ORS;  Service: Orthopedics;  Laterality: Right;   HIP SURGERY     INTRAMEDULLARY (IM) NAIL INTERTROCHANTERIC Left 09/20/2018   Procedure: INTRAMEDULLARY (IM) NAIL INTERTROCHANTRIC;  Surgeon: Marchia Drivers, MD;  Location: [facility] ORS;  Service: Orthopedics;  Laterality: Left;   TONSILLECTOMY     adnoidectomy   HPI:  Pt is a 78 y.o. female with medical history significant of Advanced Dementia, Hip fx 2024, HTN, anxiety/depression, chronic sacral wounds, PE on Eliquis , sent from nursing home for evaluation of hypotension and worsening of sacral wound.  CT head without acute findings.   CXR: no active disease.  CT abdomen pelvis demonstrates soft tissue findings over the sacral region as noted on exam, no obvious osteomyelitis.  However, with suspicion for sepsis secondary to sacral wound.  Admit dx: Sepsis; wound care consulted.  Pt resides at [facility].    Assessment / Plan / Recommendation  Clinical Impression   Pt seen for BSE today. Pt awakened to verbal/tactile stim. Mumbled, jargon for speech. Did not follow commands and often said stop that upon attempting to feed her bites/sips. She did Not follow any commands; not oriented and did not give her name.  On RA, afebrile. WBC WNL.  Pt appears to present w/ oral phase dysphagia in setting of declined  Cognitive status; Baseline Dementia.  ANY Cognitive decline can impact overall awareness/timing of swallow and safety during po tasks which increases risk for aspiration, choking.  Pt is at risk for aspiration/aspiration pneumonia as well as inability to meet her nutrition/hydration needs fully. Noted pt's presentation of Wound. Pt's risk for aspiration and poor po intake can be reduced when following general aspiration precautions, using a modified diet consistency of Pureed foods/consistency, and when given Full feeding assistance.         Pt consumed several trials of purees and thin liquids via straw(monitored for small sips) w/ No overt clinical s/s of aspiration noted: no decline in vocal quality, no cough, and no decline in respiratory status during/post trials. O2 sats remained in the mid-upper 90s during. Oral phase was grossly functional for bolus management and oral clearing of the boluses given. Oral prep Time was needed; decreased awareness w/ bolus acceptance intermittently -- she seemed to indicated often that she didn't want the po's. FULL feeding assistance required; MOD+ cues.  OM Exam appeared [facility] w/ bolus management w/ No unilateral weakness noted. Confusion w/ oral care prevented completion also.          In setting of baseline Dementia and missing Dentition, recommend initiation of the dysphagia level 1(PUREED foods) moistened for ease of oral phase w/ thin liquids; general aspiration precautions. Reduce Distractions during meals and engage pt during meals w/ self-feeding as able. FULL feeding Supervision required at all meals d/t Dementia. Pills Crushed in Puree for safer swallowing as needed.  MD/NSG updated.  ST services  recommends follow w/ Palliative Care for GOC and education re: impact of Cognitive decline/Dementia on swallowing and overall oral intake. Suspect pt is close to/at her swallowing baseline. No further skilled Acute services indicated. Pt can have f/u at her next  venue of care if needs indicate per POC.  Precautions posted in room, chart. SLP Visit Diagnosis: Dysphagia, oral phase (R13.11) (Baseline Dementia)    Aspiration Risk  Mild aspiration risk;Risk for inadequate nutrition/hydration (reduced following general aspiration precautions and full feeding assistance given)    Diet Recommendation   Thin;Dysphagia 1 (puree) = dysphagia level 1(PUREED foods) moistened for ease of oral phase w/ thin liquids; general aspiration precautions. Reduce Distractions during meals and engage pt during meals w/ self-feeding as able. FULL feeding Supervision required at all meals d/t Dementia.  Medication Administration: Crushed with puree    Other  Recommendations Recommended Consults:  (Palliative Care for GOC; Dietician) Oral Care Recommendations: Oral care BID;Oral care before and after PO;Staff/trained caregiver to provide oral care    Recommendations for follow up therapy are one component of a multi-disciplinary discharge planning process, led by the attending physician.  Recommendations may be updated based on patient status, additional functional criteria and insurance authorization.  Follow up Recommendations No SLP follow up acutely - assess if needs at next venue of care then     Assistance Recommended at Discharge  FULL  Functional Status Assessment Patient has had a recent decline in their functional status and/or demonstrates limited ability to make significant improvements in function in a reasonable and predictable amount of time  Frequency and Duration  (n/a)   (n/a)       Prognosis Prognosis for improved oropharyngeal function: Fair Barriers to Reach Goals: Cognitive deficits;Language deficits;Time post onset;Severity of deficits;Behavior Barriers/Prognosis Comment: Advanced Dementia      Swallow Study   General Date of Onset: 04/24/23 HPI: Pt is a 78 y.o. female with medical history significant of Advanced Dementia, Hip fx 2024, HTN,  anxiety/depression, chronic sacral wounds, PE on Eliquis , sent from nursing home for evaluation of hypotension and worsening of sacral wound.  CT head without acute findings.   CXR: no active disease.  CT abdomen pelvis demonstrates soft tissue findings over the sacral region as noted on exam, no obvious osteomyelitis.  However, with suspicion for sepsis secondary to sacral wound.  Admit dx: Sepsis; wound care consulted.  Pt resides at [facility]. Type of Study: Bedside Swallow Evaluation Previous Swallow Assessment: 12/2022 - Dys. 1 w/ nectar then Diet Prior to this Study: NPO Temperature Spikes Noted: No (wbc 10.3) Respiratory Status: Room air History of Recent Intubation: No Behavior/Cognition: Alert;Confused;Distractible;Requires cueing;Doesn't follow directions (jargon speech) Oral Cavity Assessment:  (could not assess) Oral Care Completed by SLP: Recent completion by staff Oral Cavity - Dentition: Edentulous (suspected) Vision:  (n/a) Self-Feeding Abilities: Total assist Patient Positioning: Postural control adequate for testing (as much as she allowed) Baseline Vocal Quality: Normal Volitional Cough: Cognitively unable to elicit Volitional Swallow: Unable to elicit    Oral/Motor/Sensory Function Overall Oral Motor/Sensory Function: Within functional limits (w/ bolus management)   Ice Chips Ice chips: Not tested   Thin Liquid Thin Liquid: Within functional limits Presentation: Straw (fed; ~3 ozs total) Other Comments: oral prep confusion    Nectar Thick Nectar Thick Liquid: Not tested   Honey Thick Honey Thick Liquid: Not tested   Puree Puree: Within functional limits Presentation: Spoon (fed; 8 trials accepted) Other Comments: min lip smacking time after   Solid  Solid: Not tested        Comer Portugal, MS, CCC-SLP Speech Language Pathologist Rehab Services; [facility] [phone] (ascom) Roe Koffman 04/25/2023,4:51 PM

## 2023-04-25 NOTE — Progress Notes (Signed)
 Patient ID: Kaylee Robinson, female   DOB: 02-10-1946, 78 y.o.   MRN: [id number]  I received a call from Coolidge Estrin, social worker and part of the legal guardian team for Kaylee Robinson.  I went through all the discussion of her patient current status and the need of debridement due to infected stage IV sacral ulcer.  Went through again through the high risk of anesthesia.  Also went through the implication of the debridement, bigger wound, complex local care.  Understanding and having expectation that this will suddenly heal due to severe malnutrition and decreased mobility.  At this point they have not made a final decision regarding the debridement.  She will meet with the social work/legal guardian team before making any further decision regarding surgery.  I recommended to return the call to the palliative care nurse and hospitalist for further discussion of other nonsurgical alternative including antibiotic therapy with local care versus palliative/hospice care.  She endorses she understood.  Lucas Sjogren, MD

## 2023-04-25 NOTE — ED Notes (Signed)
 Wound care at bedside.

## 2023-04-25 NOTE — ED Notes (Signed)
 Hospitalist notified about patient blood pressure, see orders

## 2023-04-25 NOTE — Progress Notes (Signed)
 CODE STATUS-  I did talk to patient's legal guardian Candice Gobble from DSS who wished patient to be DNR/DNI.  Candice has been talking to surgery team and understands that her overall prognosis is poor.  Risk for anesthesia and surgery high.  She would like to continue antibiotic therapy and would like to discuss more with palliative care tomorrow.  I changed CODE STATUS to DNR limited intervention.

## 2023-04-25 NOTE — ED Notes (Signed)
Meds given in applesauce.  

## 2023-04-25 NOTE — Consult Note (Signed)
 WOC Nurse Consult Note: patient with history of sacral ulcer being treated at SNF where she resides; per MRI of pelvis Findings favor sacral decubitus ulcer without osteomyelitis.  Reason for consult:  sacral wound  Wound type: 1. Unstageable PI sacrum  2.  DTPI R heel 3.  DTPI to toes B feet from overlapping and pressure  Pressure Injury POA: Yes Measurement:1.  Unstageable PI sacrum 10 cm x 8 cm x 3 cm although true depth can not be assessed as base covered in black gray necrotic tissue  2.  R heel 4 cm x 3 cm DTPI 100% purple maroon discoloration  3.  L 2nd digit DTPI 1 cm x 1 cm from overlapping; L Great MTP joint 1 cm x 1 cm area of purple maroon discoloration; between 2nd, 3rd and 4th digits areas partial thickness skin loss pink and dry approximately 0.5 cm x 0.5 cm each  4.  R 2nd digit 0.5 cm x 0.5 cm area of partial thickness skin loss pink moist  Wound bed:as above  Drainage (amount, consistency, odor) moderate tan exudate from sacral wound, no real foul smell detected, wounds to feet dry  Periwound:Skin surrounding sacral wound with significant breakdown, sloughing skin, some areas are red and moist, others with brown black necrotic tissue  Dressing procedure/placement/frequency: Cleanse sacral wound with NS, Using a Q tip applicator apply Dakin's moistened gauze to wound bed daily x 3 days.  May cover surrounding skin with Xeroform gauze Soila [phone]).  Cover entire area with ABD pad or silicone foams to secure.  After 3 days of Dakin's begin Santyl  for continued debridement.  Cleanse R heel with soap and water , apply Xeroform gauze (Lawson [phone]) to purple maroon discoloration daily.  Cover with dry gauze and place R foot in Prevalon boot to offload pressure.   Weave foam or dry gauze in between toes to separate and prevent further pressure.    Patient should be placed on a low air loss mattress for pressure redistribution.    Patient would benefit from surgical evaluation of sacral  wound if aggressive measures are desired.  POC discussed with bedside nurse and primary MD. WOC team will not follow. Re-consult if further needs arise.   Thank you,    Powell Bar MSN, RN-BC, [employer] [phone]

## 2023-04-26 DIAGNOSIS — G9341 Metabolic encephalopathy: Secondary | ICD-10-CM | POA: Diagnosis not present

## 2023-04-26 DIAGNOSIS — E43 Unspecified severe protein-calorie malnutrition: Secondary | ICD-10-CM | POA: Diagnosis not present

## 2023-04-26 DIAGNOSIS — A419 Sepsis, unspecified organism: Secondary | ICD-10-CM | POA: Diagnosis not present

## 2023-04-26 DIAGNOSIS — L89154 Pressure ulcer of sacral region, stage 4: Secondary | ICD-10-CM | POA: Diagnosis not present

## 2023-04-26 DIAGNOSIS — R6520 Severe sepsis without septic shock: Secondary | ICD-10-CM | POA: Diagnosis not present

## 2023-04-26 DIAGNOSIS — Z7189 Other specified counseling: Secondary | ICD-10-CM

## 2023-04-26 LAB — CBC
HCT: 38.2 % (ref 36.0–46.0)
Hemoglobin: 11.3 g/dL — ABNORMAL LOW (ref 12.0–15.0)
MCH: 29.6 pg (ref 26.0–34.0)
MCHC: 29.6 g/dL — ABNORMAL LOW (ref 30.0–36.0)
MCV: 100 fL (ref 80.0–100.0)
Platelets: 229 10*3/uL (ref 150–400)
RBC: 3.82 MIL/uL — ABNORMAL LOW (ref 3.87–5.11)
RDW: 13.7 % (ref 11.5–15.5)
WBC: 15.3 10*3/uL — ABNORMAL HIGH (ref 4.0–10.5)
nRBC: 0 % (ref 0.0–0.2)

## 2023-04-26 LAB — BASIC METABOLIC PANEL
Anion gap: 13 (ref 5–15)
BUN: 30 mg/dL — ABNORMAL HIGH (ref 8–23)
CO2: 16 mmol/L — ABNORMAL LOW (ref 22–32)
Calcium: 8 mg/dL — ABNORMAL LOW (ref 8.9–10.3)
Chloride: 118 mmol/L — ABNORMAL HIGH (ref 98–111)
Creatinine, Ser: 0.65 mg/dL (ref 0.44–1.00)
GFR, Estimated: >60 mL/min (ref >60)
Glucose, Bld: 46 mg/dL — ABNORMAL LOW (ref 70–99)
Potassium: 3.5 mmol/L (ref 3.5–5.1)
Sodium: 147 mmol/L — ABNORMAL HIGH (ref 135–145)

## 2023-04-26 MED ORDER — ACETAMINOPHEN 650 MG RE SUPP: 650.0000 mg | Freq: Three times a day (TID) | RECTAL | Status: DC | PRN

## 2023-04-26 MED ORDER — POTASSIUM CHLORIDE 20 MEQ PO PACK
40.0000 meq | PACK | Freq: Once | ORAL | Status: AC
  Administered 2023-04-26: 40 meq via ORAL
  Filled 2023-04-26: qty 2

## 2023-04-26 MED ORDER — OXYCODONE HCL 5 MG PO TABS
5.0000 mg | ORAL_TABLET | ORAL | Status: DC | PRN
  Administered 2023-04-28 (×2): 5 mg via ORAL
  Filled 2023-04-26 (×2): qty 1

## 2023-04-26 MED ORDER — ACETAMINOPHEN 325 MG PO TABS: 650.0000 mg | ORAL_TABLET | Freq: Three times a day (TID) | ORAL | Status: DC | PRN

## 2023-04-26 NOTE — Progress Notes (Signed)
 Daily Progress Note   Patient Name: Kaylee Robinson       Date: 04/26/2023 DOB: 1945/04/18  Age: 78 y.o. MRN#: [id number] Attending Physician: Darci Pore, MD Primary Care Physician: [facility] Admit Date: 04/24/2023  Reason for Consultation/Follow-up: Establishing goals of care  Subjective: Notes and labs reviewed. In to see patient.  She is resting in bed at this time, no family at bedside.  She is pleasantly confused and is unable to voice her needs or converse with me.  No obvious distress noted.  Called to speak with legal guardian Candace Goble.  She discusses her knowledge of the case and then conference called Sari the who is patient's child psychotherapist. We discussed status prior to admission. She states in October she could not get out of bed. She had a UTI and sepsis. She declined cognitively and physically.  She states in December her status improved where she was able to do some things for herself such as feeding herself, but she was bedbound.  She states she then began to decline again and the wound which she is here for has been present for around 2 weeks.   We discussed her diagnoses, prognosis, GOC, EOL wishes disposition and options  Created space and opportunity for patient  to explore thoughts and feelings regarding current medical information.   A detailed discussion was had today regarding advanced directives.  Concepts specific to code status, artifical feeding and hydration, IV antibiotics and rehospitalization were discussed.  The difference between an aggressive medical intervention path and a comfort care path was discussed.  Values and goals of care important to patient and family were attempted to be elicited.  Discussed limitations of medical  interventions to prolong quality of life in some situations and discussed the concept of human mortality.  We discussed the conversations and recommendations that were made prior to this conversation with primary team and specialist services.  With discussion they state they would like to shift to comfort focused care at this time and stop life-prolonging care including antibiotics.  I completed a MOST form today through [facility] with DSS Candace Gobble and Sari. She outlined their wishes for the following treatment decisions:  Cardiopulmonary Resuscitation: Do Not Attempt Resuscitation (DNR/No CPR)  Medical Interventions: Comfort Measures: Keep clean, warm, and dry. Use medication by any  route, positioning, wound care, and other measures to relieve pain and suffering. Use oxygen, suction and manual treatment of airway obstruction as needed for comfort. Do not transfer to the hospital unless comfort needs cannot be met in current location.  Antibiotics: No antibiotics (use other measures to relieve symptoms)  IV Fluids: No IV fluids (provide other measures to ensure comfort)  Feeding Tube: No feeding tube     Length of Stay: 2  Current Medications: Scheduled Meds:  . acetaminophen   650 mg Oral TID  . [START ON 04/28/2023] collagenase    Topical Daily  . docusate sodium   100 mg Oral BID  . feeding supplement  237 mL Oral TID BM  . memantine   10 mg Oral BID  . multivitamin with minerals  1 tablet Oral Daily  . sodium hypochlorite   Irrigation Daily    Continuous Infusions: . piperacillin -tazobactam (ZOSYN )  IV 3.375 g (04/26/23 1324)  . vancomycin  Stopped (04/25/23 2001)    PRN Meds: acetaminophen  **OR** acetaminophen , alum & mag hydroxide-simeth, HYDROmorphone  (DILAUDID ) injection, LORazepam , ondansetron  **OR** ondansetron  (ZOFRAN ) IV, polyethylene glycol  Physical Exam Pulmonary:     Effort: Pulmonary effort is normal.  Neurological:     Mental Status: She is alert.     Comments:  Confused             Vital Signs: BP (!) 140/121 (BP Location: Right Arm)   Pulse 61   Temp 98.6 F (37 C)   Resp 16   Ht 5' 6 (1.676 m)   Wt 44.6 kg   SpO2 (!) 79%   BMI 15.87 kg/m  SpO2: SpO2: (!) 79 % O2 Device: O2 Device: Room Air O2 Flow Rate:    Intake/output summary:  Intake/Output Summary (Last 24 hours) at 04/26/2023 1615 Last data filed at 04/26/2023 1608 Gross per 24 hour  Intake 294.32 ml  Output --  Net 294.32 ml   LBM: Last BM Date : 04/26/23 Baseline Weight: Weight: 48 kg Most recent weight: Weight: 44.6 kg     Patient Active Problem List   Diagnosis Date Noted  . Stage IV pressure ulcer of sacral region (HCC) 04/24/2023  . Hypoglycemia 01/23/2023  . Severe sepsis (HCC) 01/18/2023  . UTI (urinary tract infection) 01/17/2023  . Hypernatremia 01/17/2023  . Hypokalemia 01/17/2023  . Elevated d-dimer 01/17/2023  . Elevated troponin 01/17/2023  . Acute pulmonary embolism with acute cor pulmonale (HCC) 01/17/2023  . Closed right hip fracture (HCC) 12/07/2022  . Dementia with behavioral disturbance (HCC) 12/07/2022  . Anxiety and depression 12/07/2022  . Protein-calorie malnutrition, severe 12/07/2022  . Closed left hip fracture (HCC) 09/20/2018    Palliative Care Assessment & Plan     Recommendations/Plan: Shifting to full comfort care.  MOST form completed electronically with DSS.      Code Status:    Code Status Orders  (From admission, onward)           Start     Ordered   04/25/23 1748  Do not attempt resuscitation (DNR)- Limited -Do Not Intubate (DNI)  (Code Status)  Continuous       Question Answer Comment  If pulseless and not breathing No CPR or chest compressions.   In Pre-Arrest Conditions (Patient Is Breathing and Has A Pulse) Do not intubate. Provide all appropriate non-invasive medical interventions. Avoid ICU transfer unless indicated or required.   Consent: Discussion documented in EHR or advanced directives reviewed       04/25/23 1747  Code Status History     Date Active Date Inactive Code Status Order ID Comments User Context   04/24/2023 1523 04/25/2023 1747 Full Code [id number]  Laurita Cort DASEN, MD ED   01/17/2023 1603 01/23/2023 2252 Full Code [id number]  Darci Pore, MD ED   12/07/2022 0241 12/13/2022 2047 Full Code [id number]  Mansy, Madison LABOR, MD ED   09/20/2018 0146 09/23/2018 1747 Full Code [id number]  Mansy, Madison LABOR, MD ED       Care plan was discussed with attending and TOC via epic chat  Thank you for allowing the Palliative Medicine Team to assist in the care of this patient.   Camelia Lewis, NP  Please contact Palliative Medicine Team phone at [phone] for questions and concerns.

## 2023-04-26 NOTE — Progress Notes (Signed)
 Progress Note   [name] FMW:[id number] DOB: 1945/04/24 DOA: 04/24/2023     2 DOS: the patient was seen and examined on 04/26/2023   Brief hospital course: Kaylee Robinson is a 78 y.o. female with medical history significant of advanced dementia, HTN, anxiety/depression, chronic sacral wounds, PE on Eliquis , sent from nursing home for evaluation of hypotension and worsening of sacral wound.   Patient is admitted for further management evaluation of severe sepsis secondary to sacral wound infection.  Assessment and Plan: Severe sepsis Septic shock She has leukocytosis, hypotension, low-grade fever, elevated white count, lactic acid acute encephalopathy with source of infection sacral wound. Patient will be continued on broad-spectrum IV antibiotics including vancomycin , Zosyn . Continue gentle IV fluids, IV fluid boluses for low blood pressures. Discussed with PCCM who advised to continue current care, no need for pressors as her MAP is > 65.  Overall prognosis poor discussed with legal guardian who is DSS.  Guardian working on making her DNR DNI advised to get more information regarding her clinical condition.  Palliative care to discuss with POA regarding further care.  Acute metabolic encephalopathy- In the setting of severe sepsis. Continue to monitor neurochecks. N.p.o. as she is more lethargic than sleeping. Delirium precautions. Fall, aspiration precautions. Swallow evaluation once she is more alert.  Hypernatremia: Due to dehydration. Continue half-normal saline. Trend sodium.  Hypokalemia: IV potassium supplements ordered. Continue to monitor daily electrolytes.  Stage IV sacral decubitus ulcer present on admission. Continue wound care. Surgery team called for possible debridement. Eliquis  on hold for possible surgical intervention.  History of PE on Eliquis  which is held for possible surgical intervention of sacral ulcer.  Severe protein calorie  malnutrition BMI 17. Her overall prognosis is poor. Dietitian on board. Restart diet and supplements when she is more alert awake.  Advanced dementia: Contributes to her poor prognosis. Supportive care. Continue to discuss with legal guardian regarding clinical condition and management.  Active Pressure Injury/Wound(s)     Pressure Ulcer  Duration          Pressure Injury 04/24/23 Sacrum Unstageable - Full thickness tissue loss in which the base of the injury is covered by slough (yellow, tan, gray, green or brown) and/or eschar (tan, brown or black) in the wound bed. 10 cm x 8 cm unable to determine true  1 day   Pressure Injury 04/25/23 Heel Right Deep Tissue Pressure Injury - Purple or maroon localized area of discolored intact skin or blood-filled blister due to damage of underlying soft tissue from pressure and/or shear. 4 cm x 3 cm 100% purple maroon discolo <1 day          Nursing supportive care. Fall, aspiration precautions. DVT prophylaxis   Code Status: Do not attempt resuscitation (DNR) - Comfort care  Subjective: Patient is seen and examined today morning.  She is more alert, awake. Does not follow commands, poor historian.  Physical Exam: Vitals:   04/26/23 0535 04/26/23 0735 04/26/23 1116 04/26/23 1600  BP: 97/60 104/62 (!) 97/53 (!) 140/121  Pulse: (!) 59 82 92 61  Resp: 20 18 17 16   Temp: 97.6 F (36.4 C) 98.6 F (37 C) 97.6 F (36.4 C) 98.6 F (37 C)  TempSrc: Oral     SpO2: 91% 97% 100% (!) 79%  Weight:      Height:        General - Elderly ill Caucasian cachectic female, sleepy and lethargic. HEENT - PERRLA, EOMI, atraumatic head, non tender sinuses. Lung -  Clear, basal rales, rhonchi, no wheezes. Heart - S1, S2 heard, no murmurs, rubs, trace pedal edema. Abdomen - Soft, non tender, bowel sounds good Neuro -sleepy, lethargic, does not follow commands, unable to do full neuro exam. Skin - Warm and dry.  Data Reviewed:      Latest Ref Rng &  Units 04/26/2023    7:18 AM 04/25/2023    6:36 AM 04/25/2023    5:00 AM  CBC  WBC 4.0 - 10.5 K/uL 15.3  10.3  SPECIMEN CONTAMINATED, UNABLE TO PERFORM TEST(S).   Hemoglobin 12.0 - 15.0 g/dL 88.6  89.5    Hematocrit 36.0 - 46.0 % 38.2  34.7    Platelets 150 - 400 K/uL 229  212        Latest Ref Rng & Units 04/26/2023    7:18 AM 04/25/2023    6:36 AM 04/25/2023    5:00 AM  BMP  Glucose 70 - 99 mg/dL 46  76    BUN 8 - 23 mg/dL 30  41    Creatinine 9.55 - 1.00 mg/dL 9.34  9.16    Sodium 864 - 145 mmol/L 147  151  SPECIMEN CONTAMINATED, UNABLE TO PERFORM TEST(S).   Potassium 3.5 - 5.1 mmol/L 3.5  3.0    Chloride 98 - 111 mmol/L 118  120    CO2 22 - 32 mmol/L 16  21    Calcium 8.9 - 10.3 mg/dL 8.0  7.6     No results found.  Family Communication: Discussed with legal guardian over phone, she  understand and agree. All questions answered.  Disposition: Status is: Inpatient Remains inpatient appropriate because: septic shock due to sacral wound infection.  Planned Discharge Destination: Skilled nursing facility     MDM level 3-patient is sick with severe sepsis, worsening sacral wound.  Mentation poor, BP lower side requiring IV fluid boluses.  Electrolytes abnormal, lactic acid elevated.  Patient will need close hemodynamic, neurologic, telemetry monitoring.  She is at high risk for sudden clinical deterioration.  Author: Concepcion Riser, MD 04/26/2023 5:54 PM Secure chat 7am to 7pm For on call review [email or web address].

## 2023-04-26 NOTE — Plan of Care (Signed)

## 2023-04-26 NOTE — TOC Initial Note (Addendum)
 Transition of Care [facility]) - Initial/Assessment Note    Patient Details  Name: Kaylee Robinson MRN: [id number] Date of Birth: 11-07-1945  [facility] CM/SW Contact:    Lauraine JAYSON Carpen, LCSW Phone Number: 04/26/2023, 11:28 AM  Clinical Narrative:    Patient is a long-term resident at [facility]. CSW will follow progress and facilitate return to SNF once medically stable.            4:31 pm: Per palliative, patient is transitioning to comfort care and will return to SNF. CSW called guardian, Sari, who confirmed plan. [facility] does not have a preferred hospice agency and can provide comfort care without a hospice agency in place. This is what Sari prefers. Team is aware.  Expected Discharge Plan: Skilled Nursing Facility Barriers to Discharge: Continued Medical Work up   Patient Goals and CMS Choice     Choice offered to / list presented to : NA      Expected Discharge Plan and Services       Living arrangements for the past 2 months: Skilled Nursing Facility                                      Prior Living Arrangements/Services Living arrangements for the past 2 months: Skilled Nursing Facility Lives with:: Facility Resident Patient language and need for interpreter reviewed:: Yes        Need for Family Participation in Patient Care: Yes (Comment) Care giver support system in place?: Yes (comment)   Criminal Activity/Legal Involvement Pertinent to Current Situation/Hospitalization: No - Comment as needed  Activities of Daily Living      Permission Sought/Granted                  Emotional Assessment   Attitude/Demeanor/Rapport: Unable to Assess Affect (typically observed): Unable to Assess Orientation: :  (Disoriented x 4) Alcohol / Substance Use: Not Applicable Psych Involvement: No (comment)  Admission diagnosis:  Hypernatremia [E87.0] AKI (acute kidney injury) (HCC) [N17.9] Sepsis (HCC) [A41.9] Pressure injury of  sacral region, unstageable (HCC) [L89.150] Patient Active Problem List   Diagnosis Date Noted   Stage IV pressure ulcer of sacral region (HCC) 04/24/2023   Hypoglycemia 01/23/2023   Severe sepsis (HCC) 01/18/2023   UTI (urinary tract infection) 01/17/2023   Hypernatremia 01/17/2023   Hypokalemia 01/17/2023   Elevated d-dimer 01/17/2023   Elevated troponin 01/17/2023   Acute pulmonary embolism with acute cor pulmonale (HCC) 01/17/2023   Closed right hip fracture (HCC) 12/07/2022   Dementia with behavioral disturbance (HCC) 12/07/2022   Anxiety and depression 12/07/2022   Protein-calorie malnutrition, severe 12/07/2022   Closed left hip fracture (HCC) 09/20/2018   PCP:  [facility] Pharmacy:   [facility] - [place], [place] - [address] [place] [address] Phone: [phone] Fax: [phone]     Social Drivers of Health (SDOH) Social History: SDOH Screenings   Food Insecurity: Patient Unable To Answer (01/19/2023)  Housing: Patient Unable To Answer (01/19/2023)  Transportation Needs: Patient Unable To Answer (01/19/2023)  Utilities: Patient Unable To Answer (01/19/2023)  Financial Resource Strain: Low Risk  (06/12/2022)   Received from [facility], [facility]  Physical Activity: Insufficiently Active (06/12/2022)   Received from [name], [facility]  Social Connections: Moderately Integrated (06/12/2022)   [facility]  Stress: Patient  Unable To Answer (06/12/2022)   Received from [facility], [facility]  Tobacco Use: High Risk (04/24/2023)   SDOH Interventions:     Readmission Risk Interventions    04/24/2023    5:31 PM 01/18/2023    3:35 PM 12/13/2022   10:11 AM  Readmission Risk Prevention Plan  Transportation Screening Complete Complete Complete  PCP or Specialist Appt within 3-5 Days Complete Complete Complete  HRI or Home Care Consult  Complete Complete   Social Work Consult for Recovery Care Planning/Counseling Complete Complete Complete  Palliative Care Screening Complete Complete Not Applicable  Medication Review Oceanographer) Complete Complete Referral to Pharmacy

## 2023-04-26 NOTE — Progress Notes (Signed)
 Initial Nutrition Assessment  DOCUMENTATION CODES:   Underweight, Severe malnutrition in context of chronic illness  INTERVENTION:   -RD will follow for diet advancement and add supplements as appropriate -Due to multiple chronic wounds, RD will draw labs to determine if micronutrient deficiencies may be impairing wound healing: vitamin C, zinc , and vitamin A   NUTRITION DIAGNOSIS:   Severe Malnutrition related to chronic illness (dementia) as evidenced by moderate fat depletion, severe fat depletion, moderate muscle depletion, severe muscle depletion, percent weight loss.  GOAL:   Patient will meet greater than or equal to 90% of their needs  MONITOR:   PO intake, Supplement acceptance, Diet advancement  REASON FOR ASSESSMENT:   Consult Assessment of nutrition requirement/status  ASSESSMENT:   Pt with medical history significant of advanced dementia, HTN, anxiety/depression, chronic sacral wounds, PE on Eliquis , sent from nursing home for evaluation of hypotension and worsening of sacral wound.  Pt admitted with severe sepsis and acute metabolic encephalopathy.   2/5- s/p BSE- dysphagia 1 diet with thin liquids  Reviewed I/O's: +2.1 L x 24 hours and +1.9 L since admission  Per H&P, pt with chronic sacral wound for 2 months PTA. Pt underwent 7 days course of doxycycline  2 weeks ago. Pt also underwent debridement of wound at SNF on 04/21/23; MD recommended antibiotics based upon culture results.   Per H&P, SNF staff concerned about pt's poor oral intake. She has only been consuming 10-20% of all meals over the past 2 weeks. Nurse tried to feed pt yesterday, however, pt refused meal.   Per CWOCN notes, pt with unstageable pressure injury to sacrum, DTPI to rt heel, DTPI to toes of bilateral feet, DTPI rt heel, DPTI, lt second digit, and partial thickness wound to rt second digit.   Per general surgery notes, pt is a poor surgical candidate. Palliative care consult pending for  goals of care discussions. Pt is a ward of the state and staff have been communicating with DSS.   Spoke with pt at bedside, who was pleasant and in good spirits today. She is unable to provides history. She continued to say I love you. Thank you for taking care of me.   Reviewed wt hx; pt has experienced a 18.9% wt loss over the past 5 months, which is significant for time frame.   Pt with advanced dementia. RD would not recommend pursuing nutrition support (ex PEG) as this would not enhance pt's quality of life.   Medications reviewed and include colace.   Labs reviewed: Na: 147, CBGS: 116 (inpatient orders for glycemic control are none).    NUTRITION - FOCUSED PHYSICAL EXAM:  Flowsheet Row Most Recent Value  Orbital Region Moderate depletion  Upper Arm Region Severe depletion  Thoracic and Lumbar Region Severe depletion  Buccal Region Moderate depletion  Temple Region Moderate depletion  Clavicle Bone Region Severe depletion  Clavicle and Acromion Bone Region Severe depletion  Scapular Bone Region Severe depletion  Dorsal Hand Severe depletion  Patellar Region Severe depletion  Anterior Thigh Region Severe depletion  Posterior Calf Region Severe depletion  Edema (RD Assessment) Mild  Hair Reviewed  Eyes Reviewed  Mouth Reviewed  Skin Reviewed  Nails Reviewed       Diet Order:   Diet Order             Diet NPO time specified  Diet effective now                   EDUCATION NEEDS:  Not appropriate for education at this time  Skin:  Skin Assessment: Skin Integrity Issues: Skin Integrity Issues:: Unstageable, DTI, Other (Comment) DTI: rt heel, toes of bilateral feet, rt heel, lt second digit Unstageable: sacrum Other: partial thickness to rt second digit  Last BM:  04/26/23 (type 3)  Height:   Ht Readings from Last 1 Encounters:  04/24/23 5' 6 (1.676 m)    Weight:   Wt Readings from Last 1 Encounters:  04/25/23 44.6 kg    Ideal Body Weight:   59.1 kg  BMI:  Body mass index is 15.87 kg/m.  Estimated Nutritional Needs:   Kcal:  1400-1600  Protein:  65-80 grams  Fluid:  > 1.4 L    Margery ORN, RD, LDN, CDCES Registered Dietitian III Certified Diabetes Care and Education Specialist If unable to reach this RD, please use RD Inpatient group chat on secure chat between hours of 8am-4 pm daily

## 2023-04-26 NOTE — [place] FL2 (Signed)
 [place]  MEDICAID FL2 LEVEL OF CARE FORM     IDENTIFICATION  Patient Name: Kaylee Robinson Birthdate: 1946-01-18 Sex: female Admission Date (Current Location): 04/24/2023  [facility] and [place] Number:  Chiropodist and Address:  [facility], [address], [place], [place] [address]      Provider Number: [id number]  Attending Physician Name and Address:  Darci Pore, MD  Relative Name and Phone Number:       Current Level of Care: Hospital Recommended Level of Care: Skilled Nursing Facility (under comfort care) Prior Approval Number:    Date Approved/Denied:   PASRR Number: [id number]  Discharge Plan: SNF (with comfort care)    Current Diagnoses: Patient Active Problem List   Diagnosis Date Noted   Stage IV pressure ulcer of sacral region [facility]) 04/24/2023   Hypoglycemia 01/23/2023   Severe sepsis (HCC) 01/18/2023   UTI (urinary tract infection) 01/17/2023   Hypernatremia 01/17/2023   Hypokalemia 01/17/2023   Elevated d-dimer 01/17/2023   Elevated troponin 01/17/2023   Acute pulmonary embolism with acute cor pulmonale (HCC) 01/17/2023   Closed right hip fracture (HCC) 12/07/2022   Dementia with behavioral disturbance (HCC) 12/07/2022   Anxiety and depression 12/07/2022   Protein-calorie malnutrition, severe 12/07/2022   Closed left hip fracture (HCC) 09/20/2018    Orientation RESPIRATION BLADDER Height & Weight      (Disoriented x 4)  Normal Incontinent Weight: 98 lb 5.2 oz (44.6 kg) Height:  5' 6 (167.6 cm)  BEHAVIORAL SYMPTOMS/MOOD NEUROLOGICAL BOWEL NUTRITION STATUS  Other (Comment) (Anxious, agitated, restless.)  (Dementia) Incontinent Diet (DYS 1)  AMBULATORY STATUS COMMUNICATION OF NEEDS Skin     Verbally Other (Comment) (Erythema/redness. Unstageable pressure injury (foam). Deep tissue injury on right heel (foam).)                       Personal Care Assistance Level of Assistance               Functional Limitations Info  Sight, Hearing, Speech Sight Info: Adequate Hearing Info: Adequate Speech Info: Impaired (Incomprehensible)    SPECIAL CARE FACTORS FREQUENCY                       Contractures Contractures Info: Not present    Additional Factors Info  Code Status, Allergies Code Status Info: DNR Allergies Info: Enalapril, Naproxen, Amoxicillin , Sulfa Antibiotics           Current Medications (04/26/2023):  This is the current hospital active medication list Current Facility-Administered Medications  Medication Dose Route Frequency Provider Last Rate Last Admin   acetaminophen  (TYLENOL ) tablet 650 mg  650 mg Oral Q8H PRN Patel, Kishan S, RPH       Or   acetaminophen  (TYLENOL ) suppository 650 mg  650 mg Rectal Q8H PRN Patel, Kishan S, RPH       acetaminophen  (TYLENOL ) tablet 650 mg  650 mg Oral TID Laurita Manor T, MD   650 mg at 04/25/23 2215   alum & mag hydroxide-simeth (MAALOX/MYLANTA) 200-200-20 MG/5ML suspension 30 mL  30 mL Oral Q4H PRN Laurita Manor DASEN, MD       NOREEN ON 04/28/2023] collagenase  (SANTYL ) ointment   Topical Daily Sreeram, Narendranath, MD       docusate sodium  (COLACE) capsule 100 mg  100 mg Oral BID Laurita Manor T, MD   100 mg at 04/25/23 2215   feeding supplement (ENSURE ENLIVE / ENSURE PLUS) liquid 237 mL  237 mL Oral TID BM Zhang, Ping T, MD       HYDROmorphone  (DILAUDID ) injection 0.5-1 mg  0.5-1 mg Intravenous Q2H PRN Laurita Manor T, MD       LORazepam  (ATIVAN ) tablet 0.5 mg  0.5 mg Oral Q8H PRN Laurita Manor T, MD       memantine  (NAMENDA ) tablet 10 mg  10 mg Oral BID Laurita Manor T, MD   10 mg at 04/25/23 2215   multivitamin with minerals tablet 1 tablet  1 tablet Oral Daily Laurita Manor T, MD   1 tablet at 04/24/23 1614   ondansetron  (ZOFRAN ) tablet 4 mg  4 mg Oral Q6H PRN Laurita Manor DASEN, MD       Or   ondansetron  (ZOFRAN ) injection 4 mg  4 mg Intravenous Q6H PRN Laurita Manor T, MD       piperacillin -tazobactam (ZOSYN ) IVPB 3.375 g  3.375 g  Intravenous Q8H Lenon Elsie HERO, RPH 12.5 mL/hr at 04/26/23 1324 3.375 g at 04/26/23 1324   polyethylene glycol (MIRALAX  / GLYCOLAX ) packet 17 g  17 g Oral Daily PRN Laurita Manor DASEN, MD       potassium chloride  (KLOR-CON ) packet 40 mEq  40 mEq Oral Once Darci Pore, MD       sodium hypochlorite (DAKIN'S 1/4 STRENGTH) topical solution   Irrigation Daily Sreeram, Narendranath, MD   Given at 04/26/23 0950   vancomycin  (VANCOREADY) IVPB 750 mg/150 mL  750 mg Intravenous Q24H Lenon Elsie HERO, [facility]   Stopped at 04/25/23 2001     Discharge Medications: Please see discharge summary for a list of discharge medications.  Relevant Imaging Results:  Relevant Lab Results:   Additional Information SS#: [id number]  Lauraine JAYSON Carpen, LCSW

## 2023-04-27 DIAGNOSIS — F03918 Unspecified dementia, unspecified severity, with other behavioral disturbance: Secondary | ICD-10-CM | POA: Diagnosis not present

## 2023-04-27 DIAGNOSIS — E870 Hyperosmolality and hypernatremia: Secondary | ICD-10-CM | POA: Diagnosis not present

## 2023-04-27 DIAGNOSIS — R6520 Severe sepsis without septic shock: Secondary | ICD-10-CM | POA: Diagnosis not present

## 2023-04-27 DIAGNOSIS — E876 Hypokalemia: Secondary | ICD-10-CM | POA: Diagnosis not present

## 2023-04-27 DIAGNOSIS — Z7189 Other specified counseling: Secondary | ICD-10-CM | POA: Diagnosis not present

## 2023-04-27 DIAGNOSIS — A419 Sepsis, unspecified organism: Secondary | ICD-10-CM | POA: Diagnosis not present

## 2023-04-27 DIAGNOSIS — Z515 Encounter for palliative care: Secondary | ICD-10-CM

## 2023-04-27 NOTE — Plan of Care (Signed)
  Problem: Pain Managment: Goal: General experience of comfort will improve and/or be controlled Outcome: Progressing   Problem: Education: Goal: Knowledge of General Education information will improve Description: Including pain rating scale, medication(s)/side effects and non-pharmacologic comfort measures Outcome: Not Progressing   Problem: Clinical Measurements: Goal: Will remain free from infection Outcome: Not Progressing   Problem: Activity: Goal: Risk for activity intolerance will decrease Outcome: Not Progressing   Problem: Elimination: Goal: Will not experience complications related to urinary retention Outcome: Not Progressing   Problem: Skin Integrity: Goal: Risk for impaired skin integrity will decrease Outcome: Not Progressing

## 2023-04-27 NOTE — Progress Notes (Signed)
 Progress Note   [name] FMW:[id number] DOB: 01/15/1946 DOA: 04/24/2023     3 DOS: the patient was seen and examined on 04/27/2023   Brief hospital course: Kaylee Robinson is a 78 y.o. female with medical history significant of advanced dementia, HTN, anxiety/depression, chronic sacral wounds, PE on Eliquis , sent from nursing home for evaluation of hypotension and worsening of sacral wound.   Patient is admitted for further management evaluation of severe sepsis secondary to sacral wound infection.  After discussing with guardian patient is admitted DNR/DNI and comfort care only.  Assessment and Plan: Severe sepsis Septic shock Acute metabolic encephalopathy Hypernatremia Hypokalemia Stage IV sacral decubitus ulcer present on admission Severe protein calorie malnutrition History of PE Advanced dementia- Overall prognosis poor discussed with legal guardian who is DSS. Patient is made DNR/DNI after discussed with guardian. Palliative care to discussed with POA regarding further care.  She is made full comfort care only. Comfort care orders placed since yesterday. Plan to discharge her back to long-term care facility with hospice to follow. Pulmonary prognosis for Continued supportive care.   Active Pressure Injury/Wound(s)     Pressure Ulcer  Duration          Pressure Injury 04/24/23 Sacrum Unstageable - Full thickness tissue loss in which the base of the injury is covered by slough (yellow, tan, gray, green or brown) and/or eschar (tan, brown or black) in the wound bed. 10 cm x 8 cm unable to determine true  1 day   Pressure Injury 04/25/23 Heel Right Deep Tissue Pressure Injury - Purple or maroon localized area of discolored intact skin or blood-filled blister due to damage of underlying soft tissue from pressure and/or shear. 4 cm x 3 cm 100% purple maroon discolo <1 day          Nursing supportive care. Fall, aspiration precautions. DVT prophylaxis   Code  Status: Do not attempt resuscitation (DNR) - Comfort care  Subjective: Patient is seen and examined today morning.  She is more alert, awake. Does not follow commands, poor historian.  Physical Exam: Vitals:   04/26/23 1600 04/26/23 2010 04/27/23 0843 04/27/23 1025  BP: (!) 140/121 (!) 110/58 99/64   Pulse: 61 78  72  Resp: 16 20 16    Temp: 98.6 F (37 C) 97.9 F (36.6 C) 98.7 F (37.1 C)   TempSrc:   Oral   SpO2: (!) 79% (!) 80% (!) 77% 93%  Weight:      Height:        General - Elderly ill Caucasian cachectic female, sleeping comfortably. HEENT - PERRLA, EOMI, atraumatic head, non tender sinuses. Lung - Clear, basal rales, rhonchi, no wheezes. Heart - S1, S2 heard, no murmurs, rubs, trace pedal edema. Abdomen - Soft, non tender, bowel sounds good Neuro -sleepy, lethargic, does not follow commands, unable to do full neuro exam. Skin - Warm and dry.  Data Reviewed:      Latest Ref Rng & Units 04/26/2023    7:18 AM 04/25/2023    6:36 AM 04/25/2023    5:00 AM  CBC  WBC 4.0 - 10.5 K/uL 15.3  10.3  SPECIMEN CONTAMINATED, UNABLE TO PERFORM TEST(S).   Hemoglobin 12.0 - 15.0 g/dL 88.6  89.5    Hematocrit 36.0 - 46.0 % 38.2  34.7    Platelets 150 - 400 K/uL 229  212        Latest Ref Rng & Units 04/26/2023    7:18 AM 04/25/2023    6:36  AM 04/25/2023    5:00 AM  BMP  Glucose 70 - 99 mg/dL 46  76    BUN 8 - 23 mg/dL 30  41    Creatinine 9.55 - 1.00 mg/dL 9.34  9.16    Sodium 864 - 145 mmol/L 147  151  SPECIMEN CONTAMINATED, UNABLE TO PERFORM TEST(S).   Potassium 3.5 - 5.1 mmol/L 3.5  3.0    Chloride 98 - 111 mmol/L 118  120    CO2 22 - 32 mmol/L 16  21    Calcium 8.9 - 10.3 mg/dL 8.0  7.6     No results found.   Disposition: Status is: Inpatient Remains inpatient appropriate because: Comfort care only Planned Discharge Destination: Skilled nursing facility with hospice follow-up     Time spent 40 minutes  Author: Concepcion Riser, MD 04/27/2023 4:49 PM Secure chat  7am to 7pm For on call review [email or web address]

## 2023-04-27 NOTE — Progress Notes (Signed)
 Daily Progress Note   Patient Name: Kaylee Robinson       Date: 04/27/2023 DOB: 1945/06/25  Age: 78 y.o. MRN#: [id number] Attending Physician: Darci Pore, MD Primary Care Physician: [facility] Admit Date: 04/24/2023  Reason for Consultation/Follow-up: Establishing goals of care  Subjective: Notes and labs reviewed.  Into see patient.  She is currently sleeping soundly at this time, no distress noted.  Patient discharging back to long-term care facility with hospice to follow.  PMT will sign off as goals are set.  Length of Stay: 3  Current Medications: Scheduled Meds:  . acetaminophen   650 mg Oral TID  . [START ON 04/28/2023] collagenase    Topical Daily  . docusate sodium   100 mg Oral BID  . feeding supplement  237 mL Oral TID BM  . memantine   10 mg Oral BID    Continuous Infusions:   PRN Meds: acetaminophen  **OR** acetaminophen , alum & mag hydroxide-simeth, HYDROmorphone  (DILAUDID ) injection, LORazepam , ondansetron  **OR** ondansetron  (ZOFRAN ) IV, oxyCODONE , polyethylene glycol  Physical Exam Constitutional:      Comments: Eyes closed  Pulmonary:     Effort: Pulmonary effort is normal.             Vital Signs: BP 99/64 (BP Location: Right Arm)   Pulse 72   Temp 98.7 F (37.1 C) (Oral)   Resp 16   Ht 5' 6 (1.676 m)   Wt 44.6 kg   SpO2 93%   BMI 15.87 kg/m  SpO2: SpO2: 93 % O2 Device: O2 Device: Room Air O2 Flow Rate:    Intake/output summary:  Intake/Output Summary (Last 24 hours) at 04/27/2023 1602 Last data filed at 04/27/2023 1555 Gross per 24 hour  Intake 477 ml  Output 1 ml  Net 476 ml   LBM: Last BM Date : 04/26/23 Baseline Weight: Weight: 48 kg Most recent weight: Weight: 44.6 kg         Patient Active Problem List    Diagnosis Date Noted  . Stage IV pressure ulcer of sacral region (HCC) 04/24/2023  . Hypoglycemia 01/23/2023  . Severe sepsis (HCC) 01/18/2023  . UTI (urinary tract infection) 01/17/2023  . Hypernatremia 01/17/2023  . Hypokalemia 01/17/2023  . Elevated d-dimer 01/17/2023  . Elevated troponin 01/17/2023  . Acute pulmonary embolism with acute cor pulmonale (HCC) 01/17/2023  .  Closed right hip fracture (HCC) 12/07/2022  . Dementia with behavioral disturbance (HCC) 12/07/2022  . Anxiety and depression 12/07/2022  . Protein-calorie malnutrition, severe 12/07/2022  . Closed left hip fracture (HCC) 09/20/2018    Palliative Care Assessment & Plan     Recommendations/Plan: DC back to facility with hospice to follow   Code Status:    Code Status Orders  (From admission, onward)           Start     Ordered   04/26/23 1658  Do not attempt resuscitation (DNR) - Comfort care  (Code Status)  Continuous       Question Answer Comment  If patient has no pulse and is not breathing Do Not Attempt Resuscitation   In Pre-Arrest Conditions (Patient Is Breathing and Has a Pulse) Provide comfort measures. Relieve any mechanical airway obstruction. Avoid transfer unless required for comfort.   Consent: Discussion documented in EHR or advanced directives reviewed      04/26/23 1657           Code Status History     Date Active Date Inactive Code Status Order ID Comments User Context   04/25/2023 1747 04/26/2023 1657 Limited: Do not attempt resuscitation (DNR) -DNR-LIMITED -Do Not Intubate/DNI  [id number]  Darci Pore, MD ED   04/24/2023 1523 04/25/2023 1747 Full Code [id number]  Laurita Cort DASEN, MD ED   01/17/2023 1603 01/23/2023 2252 Full Code [id number]  Darci Pore, MD ED   12/07/2022 0241 12/13/2022 2047 Full Code [id number]  Mansy, Madison LABOR, MD ED   09/20/2018 0146 09/23/2018 1747 Full Code [id number]  Mansy, Madison LABOR, MD ED       Prognosis: Poor overall   Thank you for allowing  the Palliative Medicine Team to assist in the care of this patient.   Camelia Lewis, NP  Please contact Palliative Medicine Team phone at [phone] for questions and concerns.

## 2023-04-27 NOTE — Plan of Care (Signed)

## 2023-04-27 NOTE — Progress Notes (Signed)
 Nutrition Brief Note  Chart reviewed. Pt now transitioning to comfort care.  No further nutrition interventions planned at this time.  Please re-consult as needed.   Margery ORN, RD, LDN, CDCES Registered Dietitian III Certified Diabetes Care and Education Specialist If unable to reach this RD, please use RD Inpatient group chat on secure chat between hours of 8am-4 pm daily

## 2023-04-28 DIAGNOSIS — E870 Hyperosmolality and hypernatremia: Secondary | ICD-10-CM | POA: Diagnosis not present

## 2023-04-28 DIAGNOSIS — A419 Sepsis, unspecified organism: Secondary | ICD-10-CM | POA: Diagnosis not present

## 2023-04-28 DIAGNOSIS — E876 Hypokalemia: Secondary | ICD-10-CM | POA: Diagnosis not present

## 2023-04-28 DIAGNOSIS — F03918 Unspecified dementia, unspecified severity, with other behavioral disturbance: Secondary | ICD-10-CM | POA: Diagnosis not present

## 2023-04-28 LAB — ZINC: Zinc: 56 ug/dL (ref 44–115)

## 2023-04-28 NOTE — Plan of Care (Signed)
  Problem: Clinical Measurements: Goal: Respiratory complications will improve Outcome: Progressing   Problem: Education: Goal: Knowledge of General Education information will improve Description: Including pain rating scale, medication(s)/side effects and non-pharmacologic comfort measures Outcome: Not Progressing   Problem: Clinical Measurements: Goal: Will remain free from infection Outcome: Not Progressing   Problem: Activity: Goal: Risk for activity intolerance will decrease Outcome: Not Progressing   Problem: Pain Managment: Goal: General experience of comfort will improve and/or be controlled Outcome: Not Progressing   Problem: Skin Integrity: Goal: Risk for impaired skin integrity will decrease Outcome: Not Progressing

## 2023-04-28 NOTE — Progress Notes (Signed)
 Progress Note   [name] FMW:[id number] DOB: 16-Mar-1946 DOA: 04/24/2023     4 DOS: the patient was seen and examined on 04/28/2023   Brief hospital course: Kaylee Robinson is a 78 y.o. female with medical history significant of advanced dementia, HTN, anxiety/depression, chronic sacral wounds, PE on Eliquis , sent from nursing home for evaluation of hypotension and worsening of sacral wound.   Patient is admitted for further management evaluation of severe sepsis secondary to sacral wound infection.  After discussing with guardian patient is admitted DNR/DNI and comfort care only.  Assessment and Plan: Severe sepsis Septic shock Acute metabolic encephalopathy Hypernatremia Hypokalemia Stage IV sacral decubitus ulcer present on admission Severe protein calorie malnutrition History of PE Advanced dementia- Overall prognosis poor discussed with legal guardian who is DSS. Patient is made DNR/DNI after discussed with guardian. Palliative care to discussed with POA regarding further care.  She is made full comfort care only. Comfort care orders placed since yesterday. Plan to discharge her back to long-term care facility with hospice to follow. Pulmonary prognosis for Continued supportive care.   Active Pressure Injury/Wound(s)     Pressure Ulcer  Duration          Pressure Injury 04/24/23 Sacrum Unstageable - Full thickness tissue loss in which the base of the injury is covered by slough (yellow, tan, gray, green or brown) and/or eschar (tan, brown or black) in the wound bed. 10 cm x 8 cm unable to determine true  1 day   Pressure Injury 04/25/23 Heel Right Deep Tissue Pressure Injury - Purple or maroon localized area of discolored intact skin or blood-filled blister due to damage of underlying soft tissue from pressure and/or shear. 4 cm x 3 cm 100% purple maroon discolo <1 day          Nursing supportive care. Fall, aspiration precautions. DVT prophylaxis   Code  Status: Do not attempt resuscitation (DNR) - Comfort care  Subjective: Patient is seen and examined today morning.  She is more sleepy today. Able to awake upon calling, no distress noted.  Physical Exam: Vitals:   04/27/23 0843 04/27/23 1025 04/27/23 2003 04/28/23 0900  BP: 99/64  112/66 (!) 93/54  Pulse:  72 64 (!) 57  Resp: 16  17   Temp: 98.7 F (37.1 C)  98.4 F (36.9 C) 97.6 F (36.4 C)  TempSrc: Oral     SpO2: (!) 77% 93% 100% 98%  Weight:      Height:        General - Elderly ill Caucasian cachectic female, sleeping comfortably. HEENT - PERRLA, EOMI, atraumatic head, non tender sinuses. Lung - Clear, basal rales, rhonchi, no wheezes. Heart - S1, S2 heard, no murmurs, rubs, trace pedal edema. Abdomen - Soft, non tender, bowel sounds good Neuro -sleeping, does not follow commands, unable to do full neuro exam. Skin - Warm and dry.  Data Reviewed:      Latest Ref Rng & Units 04/26/2023    7:18 AM 04/25/2023    6:36 AM 04/25/2023    5:00 AM  CBC  WBC 4.0 - 10.5 K/uL 15.3  10.3  SPECIMEN CONTAMINATED, UNABLE TO PERFORM TEST(S).   Hemoglobin 12.0 - 15.0 g/dL 88.6  89.5    Hematocrit 36.0 - 46.0 % 38.2  34.7    Platelets 150 - 400 K/uL 229  212        Latest Ref Rng & Units 04/26/2023    7:18 AM 04/25/2023    6:36 AM  04/25/2023    5:00 AM  BMP  Glucose 70 - 99 mg/dL 46  76    BUN 8 - 23 mg/dL 30  41    Creatinine 9.55 - 1.00 mg/dL 9.34  9.16    Sodium 864 - 145 mmol/L 147  151  SPECIMEN CONTAMINATED, UNABLE TO PERFORM TEST(S).   Potassium 3.5 - 5.1 mmol/L 3.5  3.0    Chloride 98 - 111 mmol/L 118  120    CO2 22 - 32 mmol/L 16  21    Calcium 8.9 - 10.3 mg/dL 8.0  7.6     No results found.   Disposition: Status is: Inpatient Remains inpatient appropriate because: Hospice/ comfort care only Planned Discharge Destination: Skilled nursing facility with hospice follow-up     Time spent 40 minutes  Author: Concepcion Riser, MD 04/28/2023 4:24 PM Secure chat 7am  to 7pm For on call review [email or web address]

## 2023-04-29 DIAGNOSIS — E876 Hypokalemia: Secondary | ICD-10-CM | POA: Diagnosis not present

## 2023-04-29 DIAGNOSIS — A419 Sepsis, unspecified organism: Secondary | ICD-10-CM | POA: Diagnosis not present

## 2023-04-29 DIAGNOSIS — E870 Hyperosmolality and hypernatremia: Secondary | ICD-10-CM | POA: Diagnosis not present

## 2023-04-29 DIAGNOSIS — F03918 Unspecified dementia, unspecified severity, with other behavioral disturbance: Secondary | ICD-10-CM | POA: Diagnosis not present

## 2023-04-29 LAB — CULTURE, BLOOD (ROUTINE X 2)
Culture: NO GROWTH
Culture: NO GROWTH
Special Requests: ADEQUATE

## 2023-04-29 LAB — VITAMIN C: Vitamin C: 0.3 mg/dL — ABNORMAL LOW (ref 0.4–2.0)

## 2023-04-29 MED ORDER — COLLAGENASE 250 UNIT/GM EX OINT: TOPICAL_OINTMENT | Freq: Every day | CUTANEOUS | 0 refills | Status: AC

## 2023-04-29 MED ORDER — ALUM & MAG HYDROXIDE-SIMETH 200-200-20 MG/5ML PO SUSP: 30.0000 mL | ORAL | 0 refills | Status: AC | PRN

## 2023-04-29 MED ORDER — ACETAMINOPHEN 325 MG PO TABS: 650.0000 mg | ORAL_TABLET | Freq: Three times a day (TID) | ORAL | 0 refills | Status: AC

## 2023-04-29 MED ORDER — POLYETHYLENE GLYCOL 3350 17 G PO PACK: 17.0000 g | PACK | Freq: Every day | ORAL | 0 refills | Status: AC | PRN

## 2023-04-29 NOTE — Progress Notes (Signed)
 Progress Note   [name] FMW:[id number] DOB: August 25, 1945 DOA: 04/24/2023     5 DOS: the patient was seen and examined on 04/29/2023   Brief hospital course: Kaylee Robinson is a 78 y.o. female with medical history significant of advanced dementia, HTN, anxiety/depression, chronic sacral wounds, PE on Eliquis , sent from nursing home for evaluation of hypotension and worsening of sacral wound.   Patient is admitted for further management evaluation of severe sepsis secondary to sacral wound infection.  After discussing with guardian patient is admitted DNR/DNI and comfort care only.  Assessment and Plan: Severe sepsis Septic shock Acute metabolic encephalopathy Hypernatremia Hypokalemia Stage IV sacral decubitus ulcer present on admission Severe protein calorie malnutrition History of PE Advanced dementia- Overall prognosis poor discussed with legal guardian who is DSS. Patient is made DNR/DNI after discussed with guardian. Palliative care to discussed with POA regarding further care.  She is made full comfort care only. Comfort care orders placed since yesterday. Awaiting discharge back to long-term care facility with comfort care and hospice follow up. Nursing supportive care.   Active Pressure Injury/Wound(s)     Pressure Ulcer  Duration          Pressure Injury 04/24/23 Sacrum Unstageable - Full thickness tissue loss in which the base of the injury is covered by slough (yellow, tan, gray, green or brown) and/or eschar (tan, brown or black) in the wound bed. 10 cm x 8 cm unable to determine true  1 day   Pressure Injury 04/25/23 Heel Right Deep Tissue Pressure Injury - Purple or maroon localized area of discolored intact skin or blood-filled blister due to damage of underlying soft tissue from pressure and/or shear. 4 cm x 3 cm 100% purple maroon discolo <1 day          Nursing supportive care. Fall, aspiration precautions. DVT prophylaxis   Code Status: Do not  attempt resuscitation (DNR) - Comfort care  Subjective: Patient is seen and examined today morning.  She is sleeping and comfortable. Was retaining urine, got in and out yesterday. No other issues Physical Exam: Vitals:   04/27/23 2003 04/28/23 0900 04/29/23 0349 04/29/23 0858  BP: 112/66 (!) 93/54 110/63 (!) 98/59  Pulse: 64 (!) 57 67 (!) 59  Resp: 17  18   Temp: 98.4 F (36.9 C) 97.6 F (36.4 C) 98.4 F (36.9 C) 98 F (36.7 C)  TempSrc:   Oral   SpO2: 100% 98% 99% 98%  Weight:      Height:        General - Elderly ill Caucasian cachectic female, sleeping comfortably. HEENT - PERRLA, EOMI, atraumatic head, non tender sinuses. Lung - Clear, basal rales, rhonchi, no wheezes. Heart - S1, S2 heard, no murmurs, rubs, trace pedal edema. Abdomen - Soft, non tender, bowel sounds good Neuro -sleeping, does not follow commands, unable to do full neuro exam. Skin - Warm and dry.  Data Reviewed:      Latest Ref Rng & Units 04/26/2023    7:18 AM 04/25/2023    6:36 AM 04/25/2023    5:00 AM  CBC  WBC 4.0 - 10.5 K/uL 15.3  10.3  SPECIMEN CONTAMINATED, UNABLE TO PERFORM TEST(S).   Hemoglobin 12.0 - 15.0 g/dL 88.6  89.5    Hematocrit 36.0 - 46.0 % 38.2  34.7    Platelets 150 - 400 K/uL 229  212        Latest Ref Rng & Units 04/26/2023    7:18 AM 04/25/2023  6:36 AM 04/25/2023    5:00 AM  BMP  Glucose 70 - 99 mg/dL 46  76    BUN 8 - 23 mg/dL 30  41    Creatinine 9.55 - 1.00 mg/dL 9.34  9.16    Sodium 864 - 145 mmol/L 147  151  SPECIMEN CONTAMINATED, UNABLE TO PERFORM TEST(S).   Potassium 3.5 - 5.1 mmol/L 3.5  3.0    Chloride 98 - 111 mmol/L 118  120    CO2 22 - 32 mmol/L 16  21    Calcium 8.9 - 10.3 mg/dL 8.0  7.6     No results found.   Disposition: Status is: Inpatient Remains inpatient appropriate because: Hospice/ comfort care only Planned Discharge Destination: Skilled nursing facility with hospice follow-up     Time spent 36 minutes  Author: Concepcion Riser,  MD 04/29/2023 12:46 PM Secure chat 7am to 7pm For on call review [email or web address]

## 2023-04-29 NOTE — Discharge Summary (Signed)
 Physician Discharge Summary   [name] MRN: [id number] DOB: Oct 18, 1945  Admit date:     04/24/2023  Discharge date: 04/29/23  Discharge Physician: Concepcion Riser   PCP: [facility]   Recommendations at discharge:    Comfort care only  Discharge Diagnoses: Principal Problem:   Severe sepsis [facility]) Active Problems:   Dementia with behavioral disturbance (HCC)   Protein-calorie malnutrition, severe   Hypernatremia   Hypokalemia   Stage IV pressure ulcer of sacral region [facility])  Resolved Problems:   * No resolved hospital problems. *  Hospital Course: Kaylee Robinson is a 78 y.o. female with medical history significant of advanced dementia, HTN, anxiety/depression, chronic sacral wounds, PE on Eliquis , sent from nursing home for evaluation of hypotension and worsening of sacral wound.    Patient is admitted for further management evaluation of severe sepsis secondary to sacral wound infection. General surgery team discussed with DSS (guardian) regarding risks and benefits of debridement procedure. She is made DNR after discussing with guardian. Palliative discussed with legal guardian regarding goals of care and she is decided to be kept on comfort measures only. Patient to return to SNF with comfort care and hospice team to follow.   Assessment and Plan: Severe sepsis Septic shock Acute metabolic encephalopathy Hypernatremia Hypokalemia Stage IV sacral decubitus ulcer present on admission Severe protein calorie malnutrition History of PE Advanced dementia Urinary retention s/p foley catheter placement. Overall prognosis poor discussed with legal guardian who is DSS. Surgery tem advised to continue antibiotics and local wound care. Patient is made DNR/DNI after discussion with guardian. Palliative care to discussed with POA regarding further care.  She is made full comfort care only. Nursing supportive care. Continue foley care. Discharge back  to long-term care facility with comfort care and hospice follow up.      Consultants: Palliative, surgery Procedures performed: none  Disposition: Skilled nursing facility comfort care only Diet recommendation:  Discharge Diet Orders (From admission, onward)     Start     Ordered   04/29/23 0000  Diet general     dysphagia 1  Comments: Pleasure feeds   04/29/23 1711           Dysphagia type 1 thin Liquid DISCHARGE MEDICATION: Allergies as of 04/29/2023       Reactions   Enalapril Other (See Comments)   Naproxen Swelling   Amoxicillin  Rash   Sulfa Antibiotics Rash        Medication List     STOP taking these medications    apixaban  5 MG Tabs tablet Commonly known as: ELIQUIS    ciprofloxacin 500 MG tablet Commonly known as: CIPRO   divalproex 125 MG capsule Commonly known as: DEPAKOTE SPRINKLE   docusate sodium  100 MG capsule Commonly known as: COLACE   dronabinol 5 MG capsule Commonly known as: MARINOL   feeding supplement Liqd   LORazepam  0.5 MG tablet Commonly known as: ATIVAN    memantine  10 MG tablet Commonly known as: NAMENDA    multivitamin with minerals Tabs tablet   sertraline  100 MG tablet Commonly known as: ZOLOFT    traZODone  50 MG tablet Commonly known as: DESYREL    Vitamin D-1000 Max St 25 MCG (1000 UT) tablet Generic drug: Cholecalciferol       TAKE these medications    acetaminophen  325 MG tablet Commonly known as: TYLENOL  Take 2 tablets (650 mg total) by mouth 3 (three) times daily. What changed:  when to take this reasons to take this   alum &  mag hydroxide-simeth 200-200-20 MG/5ML suspension Commonly known as: MAALOX/MYLANTA Take 30 mLs by mouth every 4 (four) hours as needed for indigestion.   collagenase  250 UNIT/GM ointment Commonly known as: SANTYL  Apply topically daily.   polyethylene glycol 17 g packet Commonly known as: MIRALAX  / GLYCOLAX  Take 17 g by mouth daily as needed for mild constipation.                Discharge Care Instructions  (From admission, onward)           Start     Ordered   04/29/23 0000  Discharge wound care:       Comments: Santyl  to Sacral wound; Cleanse sacral wound with Vashe wound cleanser Soila [phone]), apply 1/4 thick layer of Santyl  to wound bed, fill in wound bed with saline moistened gauze, cover with dry gauze. Cover surrounding skin with Xeroform gauze and secure entire dressing with ABD pad or silicone foam whichever is preferred.   Cleanse R heel with soap and water , apply Xeroform gauze ([place]son [phone]) to purple maroon discoloration daily.  Cover with dry gauze and place R foot in Prevalon boot to offload pressure.  Weave foam or dry gauze in between toes to separate and prevent further pressure.     Cleanse sacral wound with NS, Using a Q tip applicator apply Dakin's moistened gauze to wound bed daily x 3 days.  May cover surrounding skin with Xeroform gauze Soila [phone]).  Cover entire area with ABD pad or silicone foams to secure.  After 3 days of Dakin's begin Santyl  for continued debridement.   04/29/23 1711            Discharge Exam: Filed Weights   04/24/23 1135 04/25/23 2256  Weight: 48 kg 44.6 kg   General - Elderly ill Caucasian cachectic female, sleeping comfortably. HEENT - PERRLA, EOMI, atraumatic head, non tender sinuses. Lung - Clear, basal rales, rhonchi, no wheezes. Heart - S1, S2 heard, no murmurs, rubs, trace pedal edema. Abdomen - Soft, non tender, bowel sounds good Neuro -sleeping, does not follow commands, unable to do full neuro exam. Skin - Warm and dry.  Condition at discharge: poor  The results of significant diagnostics from this hospitalization (including imaging, microbiology, ancillary and laboratory) are listed below for reference.   Imaging Studies: CT ABDOMEN PELVIS W CONTRAST Result Date: 04/24/2023 CLINICAL DATA:  Sepsis.  Deep sacral wound. EXAM: CT ABDOMEN AND PELVIS WITH CONTRAST TECHNIQUE:  Multidetector CT imaging of the abdomen and pelvis was performed using the standard protocol following bolus administration of intravenous contrast. RADIATION DOSE REDUCTION: This exam was performed according to the departmental dose-optimization program which includes automated exposure control, adjustment of the mA and/or kV according to patient size and/or use of iterative reconstruction technique. CONTRAST:  OMNIPAQUE  IOHEXOL  300 MG/ML  SOLN COMPARISON:  CT scan abdomen and pelvis from 10/19/2009. FINDINGS: Lower chest: The lung bases are clear. No pleural effusion. The heart is normal in size. No pericardial effusion. Hepatobiliary: The liver is normal in size. Non-cirrhotic configuration. No suspicious mass. These is mild diffuse hepatic steatosis. No intrahepatic or extrahepatic bile duct dilation. No calcified gallstones. Normal gallbladder wall thickness. No pericholecystic inflammatory changes. Pancreas: Unremarkable. No pancreatic ductal dilatation or surrounding inflammatory changes. Spleen: Within normal limits. No focal lesion. Adrenals/Urinary Tract: Adrenal glands are unremarkable. No suspicious renal mass. There is a 8 x 11 mm simple cyst in the left kidney upper pole. No hydronephrosis. No renal or ureteric calculi. PLEASE NOTE  THAT THE EVALUATION OF PELVIC VISCERA IS MILDLY LIMITED DUE TO STREAK ARTIFACTS FROM RIGHT HIP PROSTHESIS AND LEFT PROXIMAL FEMUR HARDWARE. Grossly unremarkable urinary bladder. Stomach/Bowel: No disproportionate dilation of the small or large bowel loops. No evidence of abnormal bowel wall thickening or inflammatory changes. The appendix was not visualized; however there is no acute inflammatory process in the right lower quadrant. There are multiple diverticula throughout the colon, without imaging signs of diverticulitis. Vascular/Lymphatic: No ascites or pneumoperitoneum. No abdominal or pelvic lymphadenopathy, by size criteria. No aneurysmal dilation of the major  abdominal arteries. There are moderate peripheral atherosclerotic vascular calcifications of the aorta and its major branches. Reproductive: Evaluation of reproductive organs is markedly limited due to extensive streak artifacts. There is lobulated area in the pelvis which corresponds to patient's known pre-existing fundal leiomyoma. No large adnexal mass seen. Other: There is an approximately 1.5 x 3.3 cm fluid collection in the subcutaneous tissue over the right upper gluteal region, laterally, nonspecific but may represent sequela of prior hemorrhage or infection. There is focal soft tissue defect in the right paramedian gluteal region at the level of mid sacrum. There is associated nonspecific fluid collection and small amount of air in the subcutaneous fat. However, no discrete underlying sacral bone erosions noted. Musculoskeletal: No suspicious osseous lesions. There are moderate multilevel degenerative changes in the visualized spine. There is subtle superior endplate compression deformity of L3 vertebral body without significant retropulsion or spinal canal compromise. No associated surrounding fat stranding. This is of indeterminate age but likely chronic. IMPRESSION: 1. There is focal soft tissue defect in the right paramedian gluteal region at the level of mid sacrum without underlying bone erosions. Findings favor sacral decubitus ulcer without osteomyelitis. 2. There is an additional 1.5 x 3.3 cm fluid collection in the subcutaneous tissue over the right upper gluteal region, nonspecific but may represent sequela of prior hemorrhage or infection. 3. Multiple other nonacute observations, as described above. Electronically Signed   By: Ree Molt M.D.   On: 04/24/2023 14:22   CT Head Wo Contrast Result Date: 04/24/2023 CLINICAL DATA:  Mental status change, unknown cause. EXAM: CT HEAD WITHOUT CONTRAST TECHNIQUE: Contiguous axial images were obtained from the base of the skull through the vertex  without intravenous contrast. RADIATION DOSE REDUCTION: This exam was performed according to the departmental dose-optimization program which includes automated exposure control, adjustment of the mA and/or kV according to patient size and/or use of iterative reconstruction technique. COMPARISON:  CT scan head from 01/17/2023. FINDINGS: Brain: No evidence of acute infarction, hemorrhage, hydrocephalus, extra-axial collection or mass lesion/mass effect. There is bilateral periventricular hypodensity, which is non-specific but most likely seen in the settings of microvascular ischemic changes. Moderate in extent. Otherwise normal appearance of brain parenchyma. Ventricles are normal. Cerebral volume is age appropriate. Vascular: No hyperdense vessel or unexpected calcification. Intracranial arteriosclerosis. Skull: Normal. Negative for fracture or focal lesion. Sinuses/Orbits: No acute finding. Other: Visualized mastoid air cells are unremarkable. No mastoid effusion. There are soft tissue attenuation areas completely occluding the left external auditory canal and partially obstructing the right external auditory canal. Correlate with physical examination. IMPRESSION: *No acute intracranial abnormality. *Other observations, as described above. Electronically Signed   By: Ree Molt M.D.   On: 04/24/2023 14:08   DG Chest Portable 1 View Result Date: 04/24/2023 CLINICAL DATA:  Possible sepsis. EXAM: PORTABLE CHEST 1 VIEW COMPARISON:  Chest radiograph dated 01/17/2023. FINDINGS: Emphysema. No focal consolidation, pleural effusion or pneumothorax. The cardiac silhouette is  within normal limits. Atherosclerotic calcification of the aorta. No acute osseous pathology. Osteopenia. IMPRESSION: No active disease. Electronically Signed   By: Vanetta Chou M.D.   On: 04/24/2023 12:19    Microbiology: Results for orders placed or performed during the hospital encounter of 04/24/23  Culture, blood (Routine x 2)      Status: None   Collection Time: 04/24/23 11:54 AM   Specimen: BLOOD  Result Value Ref Range Status   Specimen Description BLOOD BLOOD RIGHT HAND  Final   Special Requests   Final    BOTTLES DRAWN AEROBIC AND ANAEROBIC Blood Culture results may not be optimal due to an inadequate volume of blood received in culture bottles   Culture   Final    NO GROWTH 5 DAYS Performed at [facility], [address], [place], [place] [address]    Report Status 04/29/2023 FINAL  Final  Culture, blood (Routine x 2)     Status: None   Collection Time: 04/24/23 12:20 PM   Specimen: BLOOD  Result Value Ref Range Status   Specimen Description BLOOD BLOOD LEFT ARM  Final   Special Requests   Final    BOTTLES DRAWN AEROBIC AND ANAEROBIC Blood Culture adequate volume   Culture   Final    NO GROWTH 5 DAYS Performed at [facility], [address] [place], [place] [address]    Report Status 04/29/2023 FINAL  Final  Resp panel by RT-PCR (RSV, Flu A&B, Covid) Anterior Nasal Swab     Status: None   Collection Time: 04/24/23 12:20 PM   Specimen: Anterior Nasal Swab  Result Value Ref Range Status   SARS Coronavirus 2 by RT PCR NEGATIVE NEGATIVE Final    Comment: (NOTE) SARS-CoV-2 target nucleic acids are NOT DETECTED.  The SARS-CoV-2 RNA is generally detectable in upper respiratory specimens during the acute phase of infection. The lowest concentration of SARS-CoV-2 viral copies this assay can detect is 138 copies/mL. A negative result does not preclude SARS-Cov-2 infection and should not be used as the sole basis for treatment or other patient management decisions. A negative result may occur with  improper specimen collection/handling, submission of specimen other than nasopharyngeal swab, presence of viral mutation(s) within the areas targeted by this assay, and inadequate number of viral copies(<138 copies/mL). A negative result must be combined with clinical observations, patient  history, and epidemiological information. The expected result is Negative.  Fact Sheet for Patients:  bloggercourse.com  Fact Sheet for Healthcare Providers:  seriousbroker.it  This test is no t yet approved or cleared by the [place]  FDA and  has been authorized for detection and/or diagnosis of SARS-CoV-2 by FDA under an Emergency Use Authorization (EUA). This EUA will remain  in effect (meaning this test can be used) for the duration of the COVID-19 declaration under Section 564(b)(1) of the Act, 21 U.S.C.section 360bbb-3(b)(1), unless the authorization is terminated  or revoked sooner.       Influenza A by PCR NEGATIVE NEGATIVE Final   Influenza B by PCR NEGATIVE NEGATIVE Final    Comment: (NOTE) The Xpert Xpress SARS-CoV-2/FLU/RSV plus assay is intended as an aid in the diagnosis of influenza from Nasopharyngeal swab specimens and should not be used as a sole basis for treatment. Nasal washings and aspirates are unacceptable for Xpert Xpress SARS-CoV-2/FLU/RSV testing.  Fact Sheet for Patients: bloggercourse.com  Fact Sheet for Healthcare Providers: seriousbroker.it  This test is not yet approved or cleared by the [place]  FDA and  has been authorized for detection and/or diagnosis of SARS-CoV-2 by FDA under an Emergency Use Authorization (EUA). This EUA will remain in effect (meaning this test can be used) for the duration of the COVID-19 declaration under Section 564(b)(1) of the Act, 21 U.S.C. section 360bbb-3(b)(1), unless the authorization is terminated or revoked.     Resp Syncytial Virus by PCR NEGATIVE NEGATIVE Final    Comment: (NOTE) Fact Sheet for Patients: bloggercourse.com  Fact Sheet for Healthcare Providers: seriousbroker.it  This test is not yet approved or cleared by the [place]  FDA  and has been authorized for detection and/or diagnosis of SARS-CoV-2 by FDA under an Emergency Use Authorization (EUA). This EUA will remain in effect (meaning this test can be used) for the duration of the COVID-19 declaration under Section 564(b)(1) of the Act, 21 U.S.C. section 360bbb-3(b)(1), unless the authorization is terminated or revoked.  Performed at [facility], [address] [place], [place] [address]     Labs: CBC: Recent Labs  Lab 04/24/23 1154 04/25/23 0500 04/25/23 0636 04/26/23 0718  WBC 13.0* SPECIMEN CONTAMINATED, UNABLE TO PERFORM TEST(S). 10.3 15.3*  NEUTROABS 8.3*  --   --   --   HGB 13.4  --  10.4* 11.3*  HCT 45.5  --  34.7* 38.2  MCV 98.9  --  98.0 100.0  PLT 317  --  212 229   Basic Metabolic Panel: Recent Labs  Lab 04/24/23 1154 04/25/23 0500 04/25/23 0636 04/26/23 0718  NA 158* SPECIMEN CONTAMINATED, UNABLE TO PERFORM TEST(S). 151* 147*  K 3.8  --  3.0* 3.5  CL 120*  --  120* 118*  CO2 20*  --  21* 16*  GLUCOSE 101*  --  76 46*  BUN 56*  --  41* 30*  CREATININE 1.34*  --  0.83 0.65  CALCIUM 8.6*  --  7.6* 8.0*  MG 2.4  --   --   --    Liver Function Tests: Recent Labs  Lab 04/24/23 1154 04/25/23 0636  AST 26 25  ALT 12 11  ALKPHOS 53 42  BILITOT 0.7 0.2  PROT 6.7 5.4*  ALBUMIN 2.7* 2.1*   CBG: Recent Labs  Lab 04/24/23 1151 04/24/23 1336  GLUCAP 65* 116*    Discharge time spent: 37 minutes.  Signed: Concepcion Riser, MD [facility] 04/29/2023

## 2023-04-29 NOTE — Plan of Care (Signed)
?  Problem: Nutrition: Goal: Adequate nutrition will be maintained Outcome: Progressing   Problem: Elimination: Goal: Will not experience complications related to urinary retention Outcome: Progressing   Problem: Safety: Goal: Ability to remain free from injury will improve Outcome: Progressing   

## 2023-04-29 NOTE — TOC Transition Note (Addendum)
 Transition of Care [facility]) - Discharge Note   Patient Details  Name: Kaylee Robinson MRN: [id number] Date of Birth: 12-Jun-1945  [facility]) CM/SW Contact:  Heron KATHEE Edison, RN Phone Number: 04/29/2023, 5:07 PM   Clinical Narrative: 2/9: Per TOC supervisor, patient was approved to return [facility] LTC as comfort care. Report to be called to B-wing/Buffy at [phone]. DC Summary to be faxed to [phone]. [facility] will need to be called for transportation. EMS Med Nec from printed to [facility] excluding DNR forms.  Updated provider and unit RN of this opening to discharge if medically stable/ready. Provider indicated would discharge and is working on DC Summary at 510 pm.    6 pm [facility] called after DNR form signed, EMS forms printed to unit earlier and Sari was notified earlier of potential discharge back to [facility]TC. DC Summary, FL2 and Face sheet faxed to facility.   Bing Edison MSN RN CM  RN Case Manager [place]  Transitions of Care Direct Dial: [phone] (Weekends Only) [facility] Main Office Phone: [phone] [facility] Fax: [phone] .com     Final next level of care: Skilled Nursing Facility Barriers to Discharge: Barriers Resolved   Patient Goals and CMS Choice     Choice offered to / list presented to :  (Returning to [facility]TC as Comfort Care.)      Discharge Placement              Patient chooses bed at: [facility] Patient to be transferred to facility by: [facility] Name of family member notified: Legal Guardian, Sari, was notified earlier today.    Discharge Plan and Services Additional resources added to the After Visit Summary for                  DME Arranged: N/A DME Agency: NA       HH Arranged: NA HH Agency: NA        Social Drivers of Health (SDOH) Interventions SDOH Screenings   Food Insecurity: Patient Unable To Answer (04/29/2023)  Housing: Patient Unable To Answer (04/29/2023)   Transportation Needs: Patient Unable To Answer (04/29/2023)  Utilities: Patient Unable To Answer (04/29/2023)  Financial Resource Strain: Low Risk  (06/12/2022)   Received from [facility], [facility]  Physical Activity: Insufficiently Active (06/12/2022)   Received from [facility], [facility]  Social Connections: Patient Unable To Answer (04/29/2023)  Stress: Patient Unable To Answer (06/12/2022)   Received from [facility], [facility]  Tobacco Use: High Risk (04/24/2023)     Readmission Risk Interventions    04/24/2023    5:31 PM 01/18/2023    3:35 PM 12/13/2022   10:11 AM  Readmission Risk Prevention Plan  Transportation Screening Complete Complete Complete  PCP or Specialist Appt within 3-5 Days Complete Complete Complete  HRI or Home Care Consult  Complete Complete  Social Work Consult for Recovery Care Planning/Counseling Complete Complete Complete  Palliative Care Screening Complete Complete Not Applicable  Medication Review Oceanographer) Complete Complete Referral to Pharmacy

## 2023-04-29 NOTE — Progress Notes (Signed)
 Report called to [facility].

## 2023-05-01 LAB — VITAMIN A: Vitamin A (Retinoic Acid): 18.3 ug/dL — ABNORMAL LOW (ref 22.0–69.5)
